# Patient Record
Sex: Female | Born: 1955 | Race: White | Hispanic: No | Marital: Single | Smoking: Current every day smoker
Health system: Southern US, Community
[De-identification: ages and names within clinical notes are randomized; demographics above are authoritative.]

## PROBLEM LIST (undated history)

## (undated) DIAGNOSIS — I10 Essential (primary) hypertension: Secondary | ICD-10-CM

## (undated) DIAGNOSIS — M810 Age-related osteoporosis without current pathological fracture: Secondary | ICD-10-CM

## (undated) DIAGNOSIS — E7800 Pure hypercholesterolemia, unspecified: Secondary | ICD-10-CM

## (undated) DIAGNOSIS — N3281 Overactive bladder: Secondary | ICD-10-CM

## (undated) DIAGNOSIS — E119 Type 2 diabetes mellitus without complications: Secondary | ICD-10-CM

## (undated) DIAGNOSIS — Z9989 Dependence on other enabling machines and devices: Secondary | ICD-10-CM

## (undated) DIAGNOSIS — F32A Depression, unspecified: Secondary | ICD-10-CM

## (undated) DIAGNOSIS — G4733 Obstructive sleep apnea (adult) (pediatric): Secondary | ICD-10-CM

## (undated) DIAGNOSIS — K449 Diaphragmatic hernia without obstruction or gangrene: Secondary | ICD-10-CM

## (undated) DIAGNOSIS — C801 Malignant (primary) neoplasm, unspecified: Secondary | ICD-10-CM

## (undated) DIAGNOSIS — J8410 Pulmonary fibrosis, unspecified: Secondary | ICD-10-CM

## (undated) DIAGNOSIS — F329 Major depressive disorder, single episode, unspecified: Secondary | ICD-10-CM

## (undated) DIAGNOSIS — C3490 Malignant neoplasm of unspecified part of unspecified bronchus or lung: Secondary | ICD-10-CM

## (undated) DIAGNOSIS — F419 Anxiety disorder, unspecified: Secondary | ICD-10-CM

## (undated) DIAGNOSIS — J449 Chronic obstructive pulmonary disease, unspecified: Secondary | ICD-10-CM

## (undated) DIAGNOSIS — R011 Cardiac murmur, unspecified: Secondary | ICD-10-CM

## (undated) HISTORY — PX: ABDOMINAL HYSTERECTOMY: SHX81

## (undated) HISTORY — PX: LUNG BIOPSY: SHX232

## (undated) HISTORY — PX: BLADDER SUSPENSION: SHX72

---

## 2012-12-20 ENCOUNTER — Inpatient Hospital Stay (HOSPITAL_COMMUNITY)
Admission: EM | Admit: 2012-12-20 | Discharge: 2012-12-24 | DRG: 189 | Disposition: A | Discharge status: Home / Self Care | Payer: Medicare Other | Attending: Internal Medicine | Admitting: Internal Medicine

## 2012-12-20 ENCOUNTER — Emergency Department (HOSPITAL_COMMUNITY): Payer: Medicare Other

## 2012-12-20 ENCOUNTER — Encounter (HOSPITAL_COMMUNITY): Payer: Self-pay | Admitting: *Deleted

## 2012-12-20 DIAGNOSIS — J8410 Pulmonary fibrosis, unspecified: Secondary | ICD-10-CM | POA: Diagnosis present

## 2012-12-20 DIAGNOSIS — Z9981 Dependence on supplemental oxygen: Secondary | ICD-10-CM

## 2012-12-20 DIAGNOSIS — Z9989 Dependence on other enabling machines and devices: Secondary | ICD-10-CM | POA: Diagnosis present

## 2012-12-20 DIAGNOSIS — IMO0002 Reserved for concepts with insufficient information to code with codable children: Secondary | ICD-10-CM

## 2012-12-20 DIAGNOSIS — D509 Iron deficiency anemia, unspecified: Secondary | ICD-10-CM | POA: Diagnosis present

## 2012-12-20 DIAGNOSIS — Z79899 Other long term (current) drug therapy: Secondary | ICD-10-CM

## 2012-12-20 DIAGNOSIS — E119 Type 2 diabetes mellitus without complications: Secondary | ICD-10-CM | POA: Diagnosis present

## 2012-12-20 DIAGNOSIS — F419 Anxiety disorder, unspecified: Secondary | ICD-10-CM | POA: Diagnosis present

## 2012-12-20 DIAGNOSIS — M810 Age-related osteoporosis without current pathological fracture: Secondary | ICD-10-CM | POA: Diagnosis present

## 2012-12-20 DIAGNOSIS — K219 Gastro-esophageal reflux disease without esophagitis: Secondary | ICD-10-CM | POA: Diagnosis present

## 2012-12-20 DIAGNOSIS — F329 Major depressive disorder, single episode, unspecified: Secondary | ICD-10-CM | POA: Diagnosis present

## 2012-12-20 DIAGNOSIS — K449 Diaphragmatic hernia without obstruction or gangrene: Secondary | ICD-10-CM | POA: Diagnosis present

## 2012-12-20 DIAGNOSIS — E785 Hyperlipidemia, unspecified: Secondary | ICD-10-CM | POA: Diagnosis present

## 2012-12-20 DIAGNOSIS — N3281 Overactive bladder: Secondary | ICD-10-CM | POA: Diagnosis present

## 2012-12-20 DIAGNOSIS — F3289 Other specified depressive episodes: Secondary | ICD-10-CM | POA: Diagnosis present

## 2012-12-20 DIAGNOSIS — F17200 Nicotine dependence, unspecified, uncomplicated: Secondary | ICD-10-CM | POA: Diagnosis present

## 2012-12-20 DIAGNOSIS — E7800 Pure hypercholesterolemia, unspecified: Secondary | ICD-10-CM | POA: Diagnosis present

## 2012-12-20 DIAGNOSIS — R0902 Hypoxemia: Secondary | ICD-10-CM | POA: Diagnosis present

## 2012-12-20 DIAGNOSIS — J441 Chronic obstructive pulmonary disease with (acute) exacerbation: Secondary | ICD-10-CM | POA: Diagnosis present

## 2012-12-20 DIAGNOSIS — Z86718 Personal history of other venous thrombosis and embolism: Secondary | ICD-10-CM

## 2012-12-20 DIAGNOSIS — J449 Chronic obstructive pulmonary disease, unspecified: Secondary | ICD-10-CM

## 2012-12-20 DIAGNOSIS — I10 Essential (primary) hypertension: Secondary | ICD-10-CM | POA: Diagnosis present

## 2012-12-20 DIAGNOSIS — N318 Other neuromuscular dysfunction of bladder: Secondary | ICD-10-CM | POA: Diagnosis present

## 2012-12-20 DIAGNOSIS — F411 Generalized anxiety disorder: Secondary | ICD-10-CM | POA: Diagnosis present

## 2012-12-20 DIAGNOSIS — J9620 Acute and chronic respiratory failure, unspecified whether with hypoxia or hypercapnia: Principal | ICD-10-CM | POA: Diagnosis present

## 2012-12-20 DIAGNOSIS — I498 Other specified cardiac arrhythmias: Secondary | ICD-10-CM | POA: Diagnosis present

## 2012-12-20 DIAGNOSIS — G4733 Obstructive sleep apnea (adult) (pediatric): Secondary | ICD-10-CM | POA: Diagnosis present

## 2012-12-20 HISTORY — DX: Age-related osteoporosis without current pathological fracture: M81.0

## 2012-12-20 HISTORY — DX: Diaphragmatic hernia without obstruction or gangrene: K44.9

## 2012-12-20 HISTORY — DX: Anxiety disorder, unspecified: F41.9

## 2012-12-20 HISTORY — DX: Obstructive sleep apnea (adult) (pediatric): G47.33

## 2012-12-20 HISTORY — DX: Depression, unspecified: F32.A

## 2012-12-20 HISTORY — DX: Pure hypercholesterolemia, unspecified: E78.00

## 2012-12-20 HISTORY — DX: Dependence on other enabling machines and devices: Z99.89

## 2012-12-20 HISTORY — DX: Overactive bladder: N32.81

## 2012-12-20 HISTORY — DX: Pulmonary fibrosis, unspecified: J84.10

## 2012-12-20 HISTORY — DX: Chronic obstructive pulmonary disease, unspecified: J44.9

## 2012-12-20 HISTORY — DX: Type 2 diabetes mellitus without complications: E11.9

## 2012-12-20 HISTORY — DX: Essential (primary) hypertension: I10

## 2012-12-20 HISTORY — DX: Major depressive disorder, single episode, unspecified: F32.9

## 2012-12-20 LAB — BASIC METABOLIC PANEL
BUN: 12 mg/dL (ref 6–23)
Calcium: 9.2 mg/dL (ref 8.4–10.5)
GFR calc Af Amer: >90 mL/min (ref >90)
GFR calc non Af Amer: >90 mL/min (ref >90)
Potassium: 3.6 mEq/L (ref 3.5–5.1)
Sodium: 141 mEq/L (ref 135–145)

## 2012-12-20 LAB — CBC WITH DIFFERENTIAL/PLATELET
Basophils Relative: 0 % (ref 0–1)
Eosinophils Absolute: 0 10*3/uL (ref 0.0–0.7)
MCH: 29.6 pg (ref 26.0–34.0)
MCHC: 31.1 g/dL (ref 30.0–36.0)
Monocytes Relative: 3 % (ref 3–12)
Neutrophils Relative %: 92 % — ABNORMAL HIGH (ref 43–77)
Platelets: 413 10*3/uL — ABNORMAL HIGH (ref 150–400)

## 2012-12-20 LAB — URINALYSIS, ROUTINE W REFLEX MICROSCOPIC
Bilirubin Urine: NEGATIVE
Glucose, UA: NEGATIVE mg/dL
Ketones, ur: NEGATIVE mg/dL
Leukocytes, UA: NEGATIVE
Protein, ur: NEGATIVE mg/dL

## 2012-12-20 LAB — CREATININE, SERUM: Creatinine, Ser: 0.55 mg/dL (ref 0.50–1.10)

## 2012-12-20 LAB — POCT I-STAT 3, ART BLOOD GAS (G3+)
Acid-Base Excess: 2 mmol/L (ref 0.0–2.0)
Bicarbonate: 26.6 mEq/L — ABNORMAL HIGH (ref 20.0–24.0)
O2 Saturation: 95 %
Patient temperature: 98.6
TCO2: 28 mmol/L (ref 0–100)

## 2012-12-20 LAB — CBC
MCH: 30 pg (ref 26.0–34.0)
MCHC: 31.4 g/dL (ref 30.0–36.0)
Platelets: 439 10*3/uL — ABNORMAL HIGH (ref 150–400)
RDW: 17.2 % — ABNORMAL HIGH (ref 11.5–15.5)

## 2012-12-20 MED ORDER — ALBUTEROL SULFATE (5 MG/ML) 0.5% IN NEBU
5.0000 mg | INHALATION_SOLUTION | Freq: Once | RESPIRATORY_TRACT | Status: AC
  Administered 2012-12-20: 5 mg via RESPIRATORY_TRACT
  Filled 2012-12-20 (×2): qty 1

## 2012-12-20 MED ORDER — ACLIDINIUM BROMIDE 400 MCG/ACT IN AEPB: 1.0000 | INHALATION_SPRAY | Freq: Two times a day (BID) | RESPIRATORY_TRACT | Status: DC

## 2012-12-20 MED ORDER — POTASSIUM CHLORIDE ER 10 MEQ PO TBCR
10.0000 meq | EXTENDED_RELEASE_TABLET | Freq: Every day | ORAL | Status: DC
  Administered 2012-12-20 – 2012-12-23 (×4): 10 meq via ORAL
  Filled 2012-12-20 (×5): qty 1

## 2012-12-20 MED ORDER — AZATHIOPRINE 50 MG PO TABS
50.0000 mg | ORAL_TABLET | Freq: Two times a day (BID) | ORAL | Status: DC
  Administered 2012-12-20 – 2012-12-24 (×8): 50 mg via ORAL
  Filled 2012-12-20 (×9): qty 1

## 2012-12-20 MED ORDER — TIOTROPIUM BROMIDE MONOHYDRATE 18 MCG IN CAPS
18.0000 ug | ORAL_CAPSULE | Freq: Every day | RESPIRATORY_TRACT | Status: DC
  Administered 2012-12-21 – 2012-12-24 (×4): 18 ug via RESPIRATORY_TRACT
  Filled 2012-12-20: qty 5

## 2012-12-20 MED ORDER — PANTOPRAZOLE SODIUM 40 MG PO TBEC
40.0000 mg | DELAYED_RELEASE_TABLET | Freq: Every day | ORAL | Status: DC
  Administered 2012-12-21 – 2012-12-24 (×4): 40 mg via ORAL
  Filled 2012-12-20 (×4): qty 1

## 2012-12-20 MED ORDER — DOXYCYCLINE HYCLATE 100 MG PO TABS
100.0000 mg | ORAL_TABLET | Freq: Two times a day (BID) | ORAL | Status: DC
  Administered 2012-12-20 – 2012-12-24 (×8): 100 mg via ORAL
  Filled 2012-12-20 (×9): qty 1

## 2012-12-20 MED ORDER — INSULIN ASPART 100 UNIT/ML ~~LOC~~ SOLN
0.0000 [IU] | Freq: Three times a day (TID) | SUBCUTANEOUS | Status: DC
  Administered 2012-12-21: 3 [IU] via SUBCUTANEOUS
  Administered 2012-12-21: 5 [IU] via SUBCUTANEOUS
  Administered 2012-12-21 – 2012-12-22 (×3): 3 [IU] via SUBCUTANEOUS
  Administered 2012-12-22: 5 [IU] via SUBCUTANEOUS
  Administered 2012-12-23 (×2): 3 [IU] via SUBCUTANEOUS
  Administered 2012-12-24: 8 [IU] via SUBCUTANEOUS
  Administered 2012-12-24: 2 [IU] via SUBCUTANEOUS

## 2012-12-20 MED ORDER — POTASSIUM CHLORIDE ER 10 MEQ PO TBCR: 10.0000 meq | EXTENDED_RELEASE_TABLET | Freq: Every day | ORAL | Status: DC

## 2012-12-20 MED ORDER — FAMOTIDINE 10 MG PO TABS
10.0000 mg | ORAL_TABLET | Freq: Every day | ORAL | Status: DC
  Administered 2012-12-21 – 2012-12-24 (×4): 10 mg via ORAL
  Filled 2012-12-20 (×4): qty 1

## 2012-12-20 MED ORDER — ALBUTEROL SULFATE (5 MG/ML) 0.5% IN NEBU
2.5000 mg | INHALATION_SOLUTION | Freq: Three times a day (TID) | RESPIRATORY_TRACT | Status: DC
  Administered 2012-12-21 (×2): 2.5 mg via RESPIRATORY_TRACT
  Filled 2012-12-20 (×2): qty 0.5

## 2012-12-20 MED ORDER — INSULIN ASPART 100 UNIT/ML ~~LOC~~ SOLN
3.0000 [IU] | Freq: Three times a day (TID) | SUBCUTANEOUS | Status: DC
  Administered 2012-12-21 – 2012-12-22 (×4): 3 [IU] via SUBCUTANEOUS
  Administered 2012-12-22: 17:00:00 via SUBCUTANEOUS
  Administered 2012-12-22 – 2012-12-24 (×6): 3 [IU] via SUBCUTANEOUS

## 2012-12-20 MED ORDER — LISINOPRIL 10 MG PO TABS
10.0000 mg | ORAL_TABLET | Freq: Every day | ORAL | Status: DC
  Administered 2012-12-21 – 2012-12-23 (×3): 10 mg via ORAL
  Filled 2012-12-20 (×3): qty 1

## 2012-12-20 MED ORDER — METHYLPREDNISOLONE SODIUM SUCC 125 MG IJ SOLR
125.0000 mg | Freq: Once | INTRAMUSCULAR | Status: AC
  Administered 2012-12-20: 125 mg via INTRAVENOUS
  Filled 2012-12-20: qty 2

## 2012-12-20 MED ORDER — ALBUTEROL SULFATE (5 MG/ML) 0.5% IN NEBU: 2.5000 mg | INHALATION_SOLUTION | Freq: Three times a day (TID) | RESPIRATORY_TRACT | Status: DC

## 2012-12-20 MED ORDER — FUROSEMIDE 10 MG/ML IJ SOLN
40.0000 mg | Freq: Once | INTRAMUSCULAR | Status: AC
  Administered 2012-12-20: 40 mg via INTRAVENOUS
  Filled 2012-12-20 (×2): qty 4

## 2012-12-20 MED ORDER — OXYBUTYNIN CHLORIDE ER 10 MG PO TB24: 10.0000 mg | ORAL_TABLET | Freq: Every day | ORAL | Status: DC

## 2012-12-20 MED ORDER — ALBUTEROL SULFATE (5 MG/ML) 0.5% IN NEBU
2.5000 mg | INHALATION_SOLUTION | RESPIRATORY_TRACT | Status: DC | PRN
  Administered 2012-12-21: 2.5 mg via RESPIRATORY_TRACT
  Filled 2012-12-20: qty 0.5

## 2012-12-20 MED ORDER — IPRATROPIUM BROMIDE 0.02 % IN SOLN
0.5000 mg | Freq: Four times a day (QID) | RESPIRATORY_TRACT | Status: DC
  Filled 2012-12-20: qty 2.5

## 2012-12-20 MED ORDER — PAROXETINE HCL 30 MG PO TABS
30.0000 mg | ORAL_TABLET | Freq: Every day | ORAL | Status: DC
  Administered 2012-12-21 – 2012-12-24 (×4): 30 mg via ORAL
  Filled 2012-12-20 (×4): qty 1

## 2012-12-20 MED ORDER — IPRATROPIUM BROMIDE 0.02 % IN SOLN
0.5000 mg | Freq: Once | RESPIRATORY_TRACT | Status: AC
  Administered 2012-12-20: 0.5 mg via RESPIRATORY_TRACT
  Filled 2012-12-20 (×2): qty 2.5

## 2012-12-20 MED ORDER — SODIUM CHLORIDE 0.9 % IJ SOLN
3.0000 mL | Freq: Two times a day (BID) | INTRAMUSCULAR | Status: DC
  Administered 2012-12-20 – 2012-12-24 (×8): 3 mL via INTRAVENOUS

## 2012-12-20 MED ORDER — HEPARIN SODIUM (PORCINE) 5000 UNIT/ML IJ SOLN
5000.0000 [IU] | Freq: Three times a day (TID) | INTRAMUSCULAR | Status: DC
  Administered 2012-12-20 – 2012-12-24 (×12): 5000 [IU] via SUBCUTANEOUS
  Filled 2012-12-20 (×14): qty 1

## 2012-12-20 MED ORDER — FOLIC ACID 1 MG PO TABS
1.0000 mg | ORAL_TABLET | Freq: Two times a day (BID) | ORAL | Status: DC
  Administered 2012-12-20 – 2012-12-24 (×8): 1 mg via ORAL
  Filled 2012-12-20 (×9): qty 1

## 2012-12-20 MED ORDER — GUAIFENESIN ER 600 MG PO TB12
600.0000 mg | ORAL_TABLET | Freq: Two times a day (BID) | ORAL | Status: DC
  Administered 2012-12-20 – 2012-12-24 (×8): 600 mg via ORAL
  Filled 2012-12-20 (×9): qty 1

## 2012-12-20 MED ORDER — METHYLPREDNISOLONE SODIUM SUCC 125 MG IJ SOLR
80.0000 mg | Freq: Four times a day (QID) | INTRAMUSCULAR | Status: DC
  Administered 2012-12-20 – 2012-12-21 (×3): 80 mg via INTRAVENOUS
  Filled 2012-12-20 (×6): qty 1.28

## 2012-12-20 MED ORDER — MOMETASONE FURO-FORMOTEROL FUM 100-5 MCG/ACT IN AERO
2.0000 | INHALATION_SPRAY | Freq: Two times a day (BID) | RESPIRATORY_TRACT | Status: DC
  Administered 2012-12-20 – 2012-12-23 (×6): 2 via RESPIRATORY_TRACT
  Filled 2012-12-20: qty 8.8

## 2012-12-20 MED ORDER — OXYBUTYNIN CHLORIDE ER 10 MG PO TB24
10.0000 mg | ORAL_TABLET | Freq: Every day | ORAL | Status: DC
  Administered 2012-12-20 – 2012-12-23 (×4): 10 mg via ORAL
  Filled 2012-12-20 (×5): qty 1

## 2012-12-20 MED ORDER — LORAZEPAM 0.5 MG PO TABS
1.0000 mg | ORAL_TABLET | Freq: Every day | ORAL | Status: DC
  Administered 2012-12-20 – 2012-12-23 (×4): 1 mg via ORAL
  Filled 2012-12-20 (×4): qty 2

## 2012-12-20 MED ORDER — NICOTINE 21 MG/24HR TD PT24
21.0000 mg | MEDICATED_PATCH | Freq: Every day | TRANSDERMAL | Status: DC
  Administered 2012-12-21 – 2012-12-24 (×5): 21 mg via TRANSDERMAL
  Filled 2012-12-20 (×5): qty 1

## 2012-12-20 NOTE — ED Notes (Addendum)
Reports hx of copd and pulmonary fibrosis. Wears o2 at home. Reports having increase in sob since Monday, has been to pcp and started on prednisone, xray was negative for pneumonia. Went back for recheck yesterday and room air sats were still <80%. Had pulmonary tests done and sent here for further eval due to low sats. Also reports recent swelling to right leg.

## 2012-12-20 NOTE — Progress Notes (Signed)
Admitted to rm [id number] from ED, oriented to room, call bell placed within reach, denies pain at this time, pt short of breath on exertion, on 2L [place] which is home dose. Dianna Limbo RN did admission history. Will monitor.

## 2012-12-20 NOTE — H&P (Addendum)
[facility] History and Physical  Jessica Rollins ZOX:[id number] DOB: [birth year] DOA: 12/20/2012    Referring physician: Blinda Leatherwood, EDP PCP: Jessica Conners., MD  Specialists: none currently  Chief Complaint: SOB  HPI: Jessica Rollins is a 57 y.o. female with known h/o ILD/COPD-has had chronic cough which worsened on 12/16/12-she went there einitally to see about gettign a sleept study.  A VCR there showed no PNA but was given an extra set of prednisone, and antibiotic and a differnet inhaler than her usual.  She went home 6/12 for follow-up and when she returned.  HEr sats at that time per her recollection were noted to be in the 7-0's.  WHen she walks she states that this has been happening when she walks,. Last night she had intractable coughing and noticed that her R ankle swelled up and she was told to present by her Pulmonologist to the ED for this She usually uses Oxygen 2l and has been using this since her diagnosis in 2001-her Lung disease was biospy confirmed and the biospy caused a collapsed lung and she needed She is still a chronic smoker but has never quit   Patient states she was only a little better with the Rx given to her but as soon as she moved wehen she tried to walk she would cough and feel SOB. denies any palpitations, CP, Blurred or double vision, n/v/diarr, dysuria, falls weakness on any one side of the body, fever chills rigors, itching rash orthopnea, PND  Review of Systems:  See above  Past Medical History  Diagnosis Date  . COPD (chronic obstructive pulmonary disease)   . Pulmonary fibrosis   . Diabetes mellitus without complication   . Hiatal hernia   . Depression   . Anxiety   . High cholesterol   . Hypertension    History reviewed. No pertinent past surgical history. Social History:  reports that she has been smoking Cigarettes.  She started smoking about 49 years ago. She has been smoking about 1.50 packs per day. She does not have any smokeless tobacco  history on file. She reports that she does not drink alcohol or use illicit drugs.  No Known Allergies  Family History  Problem Relation Age of Onset  . Pulmonary fibrosis Brother    Prior to Admission medications   Medication Sig Start Date End Date Taking? Authorizing Provider  Aclidinium Bromide 400 MCG/ACT AEPB Inhale 1 Inhaler into the lungs 2 (two) times daily.   Yes Historical Provider, MD  albuterol (PROVENTIL HFA;VENTOLIN HFA) 108 (90 BASE) MCG/ACT inhaler Inhale 2 puffs into the lungs every 6 (six) hours as needed for wheezing.   Yes Historical Provider, MD  azaTHIOprine (IMURAN) 50 MG tablet Take 50 mg by mouth 2 (two) times daily.   Yes Historical Provider, MD  calcium citrate-vitamin D (CITRACAL+D) 315-200 MG-UNIT per tablet Take 1 tablet by mouth daily.   Yes Historical Provider, MD  Cimetidine (TAGAMET PO) Take 1 tablet by mouth daily.   Yes Historical Provider, MD  doxycycline (VIBRA-TABS) 100 MG tablet Take 100 mg by mouth 2 (two) times daily. Takes for 10 days.  First dose 12/16/2012.   Yes Historical Provider, MD  esomeprazole (NEXIUM) 40 MG capsule Take 40 mg by mouth daily before breakfast.   Yes Historical Provider, MD  Fluticasone-Salmeterol (ADVAIR) 250-50 MCG/DOSE AEPB Inhale 1 puff into the lungs every 12 (twelve) hours.   Yes Historical Provider, MD  folic acid (FOLVITE) 1 MG tablet Take 1 mg by mouth 2 (  two) times daily.   Yes Historical Provider, MD  guaiFENesin (MUCINEX) 600 MG 12 hr tablet Take 600 mg by mouth 2 (two) times daily.   Yes Historical Provider, MD  IRON PO Take 1 tablet by mouth 4 (four) times daily.   Yes Historical Provider, MD  lisinopril (PRINIVIL,ZESTRIL) 10 MG tablet Take 10 mg by mouth daily.   Yes Historical Provider, MD  LORazepam (ATIVAN) 1 MG tablet Take 1 mg by mouth at bedtime.   Yes Historical Provider, MD  METFORMIN HCL ER PO Take 1 tablet by mouth every evening.   Yes Historical Provider, MD  niacin 500 MG tablet Take 500 mg by mouth  daily.   Yes Historical Provider, MD  Omega-3 Fatty Acids (FISH OIL) 300 MG CAPS Take 1 capsule by mouth daily.   Yes Historical Provider, MD  oxybutynin (DITROPAN-XL) 10 MG 24 hr tablet Take 10 mg by mouth daily.   Yes Historical Provider, MD  PARoxetine (PAXIL) 30 MG tablet Take 30 mg by mouth daily.   Yes Historical Provider, MD  potassium chloride (K-DUR) 10 MEQ tablet Take 10 mEq by mouth daily.   Yes Historical Provider, MD  predniSONE (DELTASONE) 10 MG tablet Take 20 mg by mouth 2 (two) times daily.   Yes Historical Provider, MD  rosuvastatin (CRESTOR) 10 MG tablet Take 10 mg by mouth daily.   Yes Historical Provider, MD   Physical Exam: Filed Vitals:   12/20/12 1445 12/20/12 1500 12/20/12 1515 12/20/12 1530  BP: 145/66 143/68 156/69 153/65  Pulse: 61 56 53 53  Temp:      TempSrc:      Resp: 16 16 14 19   SpO2: 96% 97% 97% 94%     General:  EOMI, NCAt, looks oleder than stated age  Eyes: no ict/pallor  ENT: mod dentition  Neck: JVD elevated about 6 cm  Cardiovascular: s1 s 2no m/r/g  Respiratory: clear, no added no TVR/TVF  Abdomen: soft, NT/ND  Skin: nad  Musculoskeletal: rom intact  Psychiatric: euthymic  Neurologic: Grossly intact with no abnormal exam  Labs on Admission:  Basic Metabolic Panel:  Recent Labs Lab 12/20/12 1249  NA 141  K 3.6  CL 105  CO2 25  GLUCOSE 140*  BUN 12  CREATININE 0.46*  CALCIUM 9.2   Liver Function Tests: No results found for this basename: AST, ALT, ALKPHOS, BILITOT, PROT, ALBUMIN,  in the last 168 hours No results found for this basename: LIPASE, AMYLASE,  in the last 168 hours No results found for this basename: AMMONIA,  in the last 168 hours CBC:  Recent Labs Lab 12/20/12 1249  WBC 13.8*  NEUTROABS 12.7*  HGB 10.6*  HCT 34.1*  MCV 95.3  PLT 413*   Cardiac Enzymes:  Recent Labs Lab 12/20/12 1249  TROPONINI <0.30    BNP (last 3 results)  Recent Labs  12/20/12 1249  PROBNP 840.2*   CBG: No  results found for this basename: GLUCAP,  in the last 168 hours  Radiological Exams on Admission: Dg Chest 2 View  12/20/2012   *RADIOLOGY REPORT*  Clinical Data: Shortness of breath, history pulmonary fibrosis, COPD, diabetes, hypertension  CHEST - 2 VIEW  Comparison: None  Findings: Upper normal heart size. Atherosclerotic calcification aorta. Pulmonary vascular congestion. Diffuse interstitial changes throughout both lungs. Bibasilar atelectasis versus scarring. No gross pleural effusion or additional segmental consolidation. No pneumothorax. Bones appear demineralized.  IMPRESSION: Upper normal heart size with pulmonary vascular congestion. Bibasilar atelectasis versus scarring greater on  left. Diffuse interstitial prominence, greatest at bases, could represent chronic interstitial lung disease/fibrosis though diffuse pulmonary edema or infection could also cause this appearance. No prior exams available to know baseline appearance of the lungs.   Original Report Authenticated By: Ulyses Southward, M.D.    EKG: Independently reviewed. Sinus rhythm, borderline bradycardic, PR 0.04, QRS axis80 degreees, n acute St-t wave changes or inversions  Assessment/Plan Principal Problem:   COPD (chronic obstructive pulmonary disease) Active Problems:   Pulmonary fibrosis   Diabetes mellitus without complication   OSA on CPAP   Hypertension   Anxiety   Overactive bladder   1. Type I acute respiratory failure-likely secondary to #2 = acute exacerbation of COPD + #3-ABG shows a PaO2 of 70% on 2 L of oxygen she is compensated otherwise with a normal CO2 so this is more likely oxygen deficiency 2. AECOPD-patient probably had a bronchitis which exacerbated her already tenuous respiratory state with a diagnosis of interstitial lung disease. She will need to continue her as if). We'll place on Solu-Medrol 80 mg 3 times a day and give every 2 hourly albuterol nebulizations as well as tiotropium every 6 hourly 2.5 mg.  If she does not improve, we will consider a pulmonary consultation. Continue doxycycline-start date was 12/16/2012 3. ILD-continue Imuran, steroids-consider trial with Pirfenidone as per Pulmonary as an out-patient as [facility] a center for this trial 4. ? CHF-no history of ischemic heart disease, however she has a BNP of 800 which makes her diagnosis CHF potential, we'll get an echocardiogram to rule out wall motion abnormalities although EKG does not show any ventricular hypertrophy and looks in fact normal-I have given one dose of IV lasix and dependant on response to breathing, and Echo results might need to continue this later in hospital stay 5. Diabetes mellitus-expect her diabetic control to worsen, hold oral metformin and place on supplemental scale insulin moderate sensitivity coverage-we'll add long-acting insulin depending on trends. 6. Obstructive sleep apnea on CPAP-we'll start CPAP at 13 cm H2O each bedtime 7. Hypertension-patient is on lisinopril 10 mg daily. Given she has such Lopressor pulmonary history, one must consider discontinuing this given this can cause ACE-induced cough potentially place her on ARB instead 8. Anxiety/depression continue lorazepam 1 mg each bedtime, Paxil 30 mg daily 9. Reflux continue Protonix 40 mg daily, famotidine 10 mg daily [replacement for cimetidine and omeprazole] 10. Hyperlipidemia hold niacin, Crestor 10 mg, omega-3 fatty acids for now 11. History of DVT-was treated for this about 30 years ago 6 months of unfractionated heparin. Low probability of this being the cause for shortness of breath. 12. Tobacco abuse patient still smokes one and half packets a day despite her multiple lung disorders. This is probably the rate limiting step to her getting better. Counseled her to discontinue the same 13. Osteoporosis continue Citracal D. as an outpatient 14. Iron deficiency anemia-continue folic acid and iron as an outpatient  None   Code Status: Full   Family Communication: Discussed with son at bedside  Disposition Plan: inpatient, at least 2 days  Time spent: 22  Mahala Menghini [facility] Pager [phone]  If 7PM-7AM, please contact night-coverage [email or web address] Password [facility] 12/20/2012, 4:34 PM

## 2012-12-20 NOTE — ED Provider Notes (Signed)
History     CSN: [id number]  Arrival date & time 12/20/12  1218   First MD Initiated Contact with Patient 12/20/12 1234      Chief Complaint  Patient presents with  . Shortness of Breath    (Consider location/radiation/quality/duration/timing/severity/associated sxs/prior treatment) HPI Comments: Patient comes here to cover for evaluation of progressively worsening difficulty breathing. Patient has been having trouble for several weeks. She has a history of COPD and pulmonary fibrosis. She saw her pulmonary doctor on Monday. She was started on prednisone taper, doxycycline and switched from albuterol inhaler to nebulizer. Patient reports that her oxygen saturations dropped down into the 70s when she ambulated there that day. She was seen again yesterday and had similar hypoxia. Overnight, however, she says her right leg started to swell. Her breathing has worsened. She presents to the ER today because her doctor's office is closed.  Patient is a 57 y.o. female presenting with shortness of breath.  Shortness of Breath Associated symptoms: no chest pain     Past Medical History  Diagnosis Date  . COPD (chronic obstructive pulmonary disease)   . Pulmonary fibrosis   . Diabetes mellitus without complication   . Hiatal hernia   . Depression   . Anxiety   . High cholesterol   . Hypertension     History reviewed. No pertinent past surgical history.  History reviewed. No pertinent family history.  History  Substance Use Topics  . Smoking status: Current Every Day Smoker    Types: Cigarettes  . Smokeless tobacco: Not on file  . Alcohol Use: No    OB History   Grav Para Term Preterm Abortions TAB SAB Ect Mult Living                  Review of Systems  Constitutional: Positive for chills.  Respiratory: Positive for shortness of breath.   Cardiovascular: Positive for leg swelling. Negative for chest pain.  All other systems reviewed and are negative.    Allergies   Review of patient's allergies indicates no known allergies.  Home Medications  No current outpatient prescriptions on file.  BP 134/69  Pulse 64  Temp(Src) 98.4 F (36.9 C) (Oral)  Resp 24  SpO2 96%  Physical Exam  Constitutional: She is oriented to person, place, and time. She appears well-developed and well-nourished. No distress.  HENT:  Head: Normocephalic and atraumatic.  Right Ear: Hearing normal.  Left Ear: Hearing normal.  Nose: Nose normal.  Mouth/Throat: Oropharynx is clear and moist and mucous membranes are normal.  Eyes: Conjunctivae and EOM are normal. Pupils are equal, round, and reactive to light.  Neck: Normal range of motion. Neck supple.  Cardiovascular: Regular rhythm, S1 normal and S2 normal.  Exam reveals no gallop and no friction rub.   No murmur heard. Pulmonary/Chest: Accessory muscle usage present. No respiratory distress. She has decreased breath sounds. She has wheezes. She has rhonchi. She exhibits no tenderness.  Abdominal: Soft. Normal appearance and bowel sounds are normal. There is no hepatosplenomegaly. There is no tenderness. There is no rebound, no guarding, no tenderness at McBurney's point and negative Murphy's sign. No hernia.  Musculoskeletal: Normal range of motion. She exhibits edema.  Right pedal edema  Neurological: She is alert and oriented to person, place, and time. She has normal strength. No cranial nerve deficit or sensory deficit. Coordination normal. GCS eye subscore is 4. GCS verbal subscore is 5. GCS motor subscore is 6.  Skin: Skin is warm, dry  and intact. No rash noted. No cyanosis.  Psychiatric: She has a normal mood and affect. Her speech is normal and behavior is normal. Thought content normal.    ED Course  Procedures (including critical care time)  EKG:  Date: 12/20/2012  Rate: 56  Rhythm: normal sinus rhythm  QRS Axis: normal  Intervals: normal  ST/T Wave abnormalities: normal  Conduction Disutrbances:none   Narrative Interpretation:   Old EKG Reviewed: none available    Labs Reviewed - No data to display Dg Chest 2 View  12/20/2012   *RADIOLOGY REPORT*  Clinical Data: Shortness of breath, history pulmonary fibrosis, COPD, diabetes, hypertension  CHEST - 2 VIEW  Comparison: None  Findings: Upper normal heart size. Atherosclerotic calcification aorta. Pulmonary vascular congestion. Diffuse interstitial changes throughout both lungs. Bibasilar atelectasis versus scarring. No gross pleural effusion or additional segmental consolidation. No pneumothorax. Bones appear demineralized.  IMPRESSION: Upper normal heart size with pulmonary vascular congestion. Bibasilar atelectasis versus scarring greater on left. Diffuse interstitial prominence, greatest at bases, could represent chronic interstitial lung disease/fibrosis though diffuse pulmonary edema or infection could also cause this appearance. No prior exams available to know baseline appearance of the lungs.   Original Report Authenticated By: Ulyses Southward, M.D.     Diagnosis: 1. COPD exacerbation 2. Pulmonary fibrosis    MDM  Patient presents to the ER for evaluation of difficulty breathing. Patient has been experiencing progressively worsening breathing difficulty for several weeks. She has a history of COPD as well as pulmonary fibrosis. She is on aggressive outpatient therapy currently with doxycycline, prednisone, bronchodilators. Symptoms are worsening rather than improving. Oxygenation is adequate, the patient reports that she has had significant desaturation when she was evaluated by her pulmonologist twice this past week. Patient will require hospitalization for worsening symptoms despite maximal outpatient therapy.        Gilda Crease, MD 12/20/12 [phone]

## 2012-12-21 DIAGNOSIS — E119 Type 2 diabetes mellitus without complications: Secondary | ICD-10-CM

## 2012-12-21 DIAGNOSIS — J441 Chronic obstructive pulmonary disease with (acute) exacerbation: Secondary | ICD-10-CM | POA: Diagnosis present

## 2012-12-21 DIAGNOSIS — J9620 Acute and chronic respiratory failure, unspecified whether with hypoxia or hypercapnia: Secondary | ICD-10-CM | POA: Diagnosis present

## 2012-12-21 DIAGNOSIS — I10 Essential (primary) hypertension: Secondary | ICD-10-CM

## 2012-12-21 LAB — COMPREHENSIVE METABOLIC PANEL
ALT: 12 U/L (ref 0–35)
AST: 9 U/L (ref 0–37)
Albumin: 3.2 g/dL — ABNORMAL LOW (ref 3.5–5.2)
Alkaline Phosphatase: 85 U/L (ref 39–117)
BUN: 19 mg/dL (ref 6–23)
Chloride: 98 mEq/L (ref 96–112)
Potassium: 4.1 mEq/L (ref 3.5–5.1)
Sodium: 137 mEq/L (ref 135–145)
Total Bilirubin: 0.3 mg/dL (ref 0.3–1.2)

## 2012-12-21 LAB — CBC
HCT: 36.9 % (ref 36.0–46.0)
MCH: 29.6 pg (ref 26.0–34.0)
MCHC: 31.2 g/dL (ref 30.0–36.0)
MCV: 94.9 fL (ref 78.0–100.0)
Platelets: 407 10*3/uL — ABNORMAL HIGH (ref 150–400)
RDW: 17.2 % — ABNORMAL HIGH (ref 11.5–15.5)

## 2012-12-21 LAB — GLUCOSE, CAPILLARY

## 2012-12-21 MED ORDER — ALBUTEROL SULFATE (5 MG/ML) 0.5% IN NEBU
2.5000 mg | INHALATION_SOLUTION | Freq: Four times a day (QID) | RESPIRATORY_TRACT | Status: DC
  Administered 2012-12-21 – 2012-12-24 (×8): 2.5 mg via RESPIRATORY_TRACT
  Filled 2012-12-21 (×9): qty 0.5

## 2012-12-21 MED ORDER — FUROSEMIDE 10 MG/ML IJ SOLN
40.0000 mg | Freq: Once | INTRAMUSCULAR | Status: AC
  Administered 2012-12-21: 40 mg via INTRAVENOUS
  Filled 2012-12-21: qty 4

## 2012-12-21 MED ORDER — METHYLPREDNISOLONE SODIUM SUCC 125 MG IJ SOLR
60.0000 mg | Freq: Four times a day (QID) | INTRAMUSCULAR | Status: DC
  Administered 2012-12-21 – 2012-12-23 (×7): 60 mg via INTRAVENOUS
  Filled 2012-12-21 (×11): qty 0.96

## 2012-12-21 NOTE — Progress Notes (Signed)
Pt states she is feeling better today, pt given information on smoking cessation, pt verbalized she wants to quit but has had a tough time with quitting

## 2012-12-21 NOTE — Progress Notes (Addendum)
[facility] PROGRESS NOTE  Jessica Rollins FAO:[id number] DOB: [birth year] DOA: 12/20/2012 PCP: Maryann Conners., MD  Brief narrative [phone] female patient with history of chronic oxygen-dependent (2 L per minute) respiratory failure, COPD, OSA on CPAP, ILD (biopsy confirmed), smoker gives 2-3 week history of dry cough, worsening dyspnea and wheezing especially with minimal activity with associated oxygen desaturation in the 70s. She was seen by her pulmonologist and treated with steroids and antibiotics without significant relief. She noticed some swelling right ankle and? Pain yesterday. Due to persistent/worsening symptoms, patient presented to the hospital. She denies recent long-distance travel.  Assessment/Plan: 1. Acute on chronic respiratory failure: Chronic respiratory failure secondary to COPD, ILD and OSA. Acute decompensation may be secondary to COPD exacerbation. No features suggestive of overt heart failure (however chest x-ray suggests possible pulmonary edema). Other possibility is progressive ILD. Tobacco cessation counseled. Patient states that she feels fine at rest but with minimal activity becomes symptomatic. Continue oxygen, IV Solu-Medrol, bronchodilator nebulizations and doxycycline. Continue Imuran. Given complexity of case and no significant improvement with conventional OP treatment-requested pulmonology consultation for evaluation and management. Trial of her dose of IV Lasix. 2. Type II DM: Hold metformin. Continue SSI and Lantus. 3. HTN: For now continue lisinopril but may consider changing to ARB. 4. Tobacco abuse: Patient continues to smoke 1.5 packs of cigarettes per day. Tobacco cessation counseled. 5. Anxiety and depression: Continue home medications. 6. GERD: Continue PPI and Pepcid. 7. HL: Continue home medications. 8. Remote history of DVT: 9. Anemia: Stable   Code Status: Full Family Communication: Discussed with son Disposition Plan: Home when  medically stable   Consultants:  Pulmonology  Procedures:  None  Antibiotics:  Doxycycline   HPI/Subjective: No significant dyspnea, cough or wheezing at rest but has the symptoms with minimal activity. Right ankle swelling has resolved.  Objective: Filed Vitals:   12/21/12 0200 12/21/12 0613 12/21/12 0631 12/21/12 1414  BP: 158/64 167/70  139/61  Pulse: 64 61  61  Temp: 97.4 F (36.3 C) 97.4 F (36.3 C)    TempSrc: Oral Oral    Resp: 18 18  14   Height:      Weight:  66.134 kg (145 lb 12.8 oz)    SpO2: 91% 93% 94% 97%    Intake/Output Summary (Last 24 hours) at 12/21/12 1500 Last data filed at 12/21/12 0826  Gross per 24 hour  Intake    590 ml  Output    600 ml  Net    -10 ml   Filed Weights   12/20/12 1855 12/21/12 0613  Weight: 66.5 kg (146 lb 9.7 oz) 66.134 kg (145 lb 12.8 oz)    Exam:   General exam: Comfortable.  Respiratory system: Reduced breath sounds bilaterally with occasional bilateral expiratory rhonchi. Bibasal coarse Velcro-like crackles right >  Left.No increased work of breathing. Able to speak in full sentences  Cardiovascular system: S1 & S2 heard, RRR. No JVD, murmurs, gallops, clicks or pedal edema.  Gastrointestinal system: Abdomen is nondistended, soft and nontender. Normal bowel sounds heard.  Central nervous system: Alert and oriented. No focal neurological deficits.  Extremities: Symmetric 5 x 5 power. No acute findings in legs or right ankle.   Data Reviewed: Basic Metabolic Panel:  Recent Labs Lab 12/20/12 1249 12/20/12 2056 12/21/12 0530  NA 141  --  137  K 3.6  --  4.1  CL 105  --  98  CO2 25  --  26  GLUCOSE 140*  --  185*  BUN 12  --  19  CREATININE 0.46* 0.55 0.50  CALCIUM 9.2  --  9.0   Liver Function Tests:  Recent Labs Lab 12/21/12 0530  AST 9  ALT 12  ALKPHOS 85  BILITOT 0.3  PROT 6.6  ALBUMIN 3.2*   No results found for this basename: LIPASE, AMYLASE,  in the last 168 hours No results found  for this basename: AMMONIA,  in the last 168 hours CBC:  Recent Labs Lab 12/20/12 1249 12/20/12 2056 12/21/12 0530  WBC 13.8* 9.1 7.7  NEUTROABS 12.7*  --   --   HGB 10.6* 11.7* 11.5*  HCT 34.1* 37.3 36.9  MCV 95.3 95.6 94.9  PLT 413* 439* 407*   Cardiac Enzymes:  Recent Labs Lab 12/20/12 1249  TROPONINI <0.30   BNP (last 3 results)  Recent Labs  12/20/12 1249  PROBNP 840.2*   CBG:  Recent Labs Lab 12/20/12 1944 12/20/12 2146 12/21/12 0555 12/21/12 1105  GLUCAP 156* 213* 169* 244*    No results found for this or any previous visit (from the past 240 hour(s)).   Studies: Dg Chest 2 View  12/20/2012   *RADIOLOGY REPORT*  Clinical Data: Shortness of breath, history pulmonary fibrosis, COPD, diabetes, hypertension  CHEST - 2 VIEW  Comparison: None  Findings: Upper normal heart size. Atherosclerotic calcification aorta. Pulmonary vascular congestion. Diffuse interstitial changes throughout both lungs. Bibasilar atelectasis versus scarring. No gross pleural effusion or additional segmental consolidation. No pneumothorax. Bones appear demineralized.  IMPRESSION: Upper normal heart size with pulmonary vascular congestion. Bibasilar atelectasis versus scarring greater on left. Diffuse interstitial prominence, greatest at bases, could represent chronic interstitial lung disease/fibrosis though diffuse pulmonary edema or infection could also cause this appearance. No prior exams available to know baseline appearance of the lungs.   Original Report Authenticated By: Ulyses Southward, M.D.     Additional labs:   Scheduled Meds: . albuterol  2.5 mg Nebulization TID  . azaTHIOprine  50 mg Oral BID  . doxycycline  100 mg Oral BID  . famotidine  10 mg Oral Daily  . folic acid  1 mg Oral BID  . guaiFENesin  600 mg Oral BID  . heparin  5,000 Units Subcutaneous Q8H  . insulin aspart  0-15 Units Subcutaneous TID WC  . insulin aspart  3 Units Subcutaneous TID WC  . lisinopril  10 mg  Oral Daily  . LORazepam  1 mg Oral QHS  . methylPREDNISolone (SOLU-MEDROL) injection  80 mg Intravenous Q6H  . mometasone-formoterol  2 puff Inhalation BID  . nicotine  21 mg Transdermal Daily  . oxybutynin  10 mg Oral QHS  . pantoprazole  40 mg Oral Daily  . PARoxetine  30 mg Oral Daily  . potassium chloride  10 mEq Oral QHS  . sodium chloride  3 mL Intravenous Q12H  . tiotropium  18 mcg Inhalation Daily   Continuous Infusions:   Principal Problem:   COPD (chronic obstructive pulmonary disease) Active Problems:   Pulmonary fibrosis   Diabetes mellitus without complication   OSA on CPAP   Hypertension   Anxiety   Overactive bladder    Time spent: 40 minutes.    [facility]  [facility] Pager [phone].   If 8PM-8AM, please contact night-coverage at [email or web address], password [facility] 12/21/2012, 3:00 PM  LOS: 1 day

## 2012-12-21 NOTE — Progress Notes (Signed)
Nutrition Brief Note  Patient identified on the Malnutrition Screening Tool (MST) Report  Body mass index is 25.83 kg/(m^2). Patient meets criteria for overweight based on current BMI.   Current diet order is CHO modified, patient is consuming approximately 100% of meals at this time. Labs and medications reviewed. Met with pt who reports good appetite PTA, eats 2 meals/day. Pt reports over the past year or so she has lost 30 pounds but it has been gradual. Pt eating excellent during admission without any nutritional concerns or needs.   No nutrition interventions warranted at this time. If nutrition issues arise, please consult RD.    Levon Hedger MS, RD, LDN [phone] Weekend/After Hours Pager

## 2012-12-22 DIAGNOSIS — I059 Rheumatic mitral valve disease, unspecified: Secondary | ICD-10-CM

## 2012-12-22 DIAGNOSIS — G4733 Obstructive sleep apnea (adult) (pediatric): Secondary | ICD-10-CM

## 2012-12-22 DIAGNOSIS — J441 Chronic obstructive pulmonary disease with (acute) exacerbation: Secondary | ICD-10-CM

## 2012-12-22 DIAGNOSIS — J8410 Pulmonary fibrosis, unspecified: Secondary | ICD-10-CM

## 2012-12-22 DIAGNOSIS — J9620 Acute and chronic respiratory failure, unspecified whether with hypoxia or hypercapnia: Principal | ICD-10-CM

## 2012-12-22 LAB — GLUCOSE, CAPILLARY
Glucose-Capillary: 225 mg/dL — ABNORMAL HIGH (ref 70–99)
Glucose-Capillary: 179 mg/dL — ABNORMAL HIGH (ref 70–99)

## 2012-12-22 LAB — BASIC METABOLIC PANEL
BUN: 31 mg/dL — ABNORMAL HIGH (ref 6–23)
Chloride: 97 mEq/L (ref 96–112)
Glucose, Bld: 238 mg/dL — ABNORMAL HIGH (ref 70–99)
Potassium: 3.9 mEq/L (ref 3.5–5.1)
Sodium: 137 mEq/L (ref 135–145)

## 2012-12-22 LAB — HEMOGLOBIN A1C
Hgb A1c MFr Bld: 5.9 % — ABNORMAL HIGH (ref <5.7)
Mean Plasma Glucose: 123 mg/dL — ABNORMAL HIGH (ref <117)

## 2012-12-22 MED ORDER — ACETAMINOPHEN 325 MG PO TABS
650.0000 mg | ORAL_TABLET | Freq: Four times a day (QID) | ORAL | Status: DC | PRN
  Administered 2012-12-22: 650 mg via ORAL
  Filled 2012-12-22: qty 2

## 2012-12-22 NOTE — Progress Notes (Signed)
Lung sounds- good air exchange with sparatic inspiratory wheezing.  Respirations even and mildly labored with exertion.  Oxygen extention line to allow client up in room without removing.  Nicotine patch to left arm.

## 2012-12-22 NOTE — Progress Notes (Signed)
[facility] PROGRESS NOTE  Jessica Rollins WGN:[id number] DOB: [birth year] DOA: 12/20/2012 PCP: Maryann Conners., MD  Brief narrative [phone] female patient with history of chronic oxygen-dependent (2 L per minute) respiratory failure, COPD, OSA on CPAP, ILD (biopsy confirmed), smoker gives 2-3 week history of dry cough, worsening dyspnea and wheezing especially with minimal activity with associated oxygen desaturation in the 70s. She was seen by her pulmonologist and treated with steroids and antibiotics without significant relief. She noticed some swelling right ankle and? Pain yesterday. Due to persistent/worsening symptoms, patient presented to the hospital. She denies recent long-distance travel.  Assessment/Plan: 1. Acute on chronic respiratory failure: Chronic respiratory failure secondary to COPD, ILD and OSA. Acute decompensation may be secondary to COPD exacerbation. No features suggestive of overt heart failure (however chest x-ray suggests possible pulmonary edema). Other possibility is progressive ILD. Tobacco cessation counseled. Patient states that she feels fine at rest but with minimal activity becomes symptomatic. Continue oxygen, IV Solu-Medrol, bronchodilator nebulizations and doxycycline. Continue Imuran. Given complexity of case and no significant improvement with conventional OP treatment-requested pulmonology consultation for evaluation and management. Trial of her dose of IV Lasix. Patient states she feels better-less dyspneic and no coughing with activity 2. Type II DM: Hold metformin. Continue SSI and Lantus. 3. HTN: For now continue lisinopril but may consider changing to ARB. 4. Tobacco abuse: Patient continues to smoke 1.5 packs of cigarettes per day. Tobacco cessation counseled-reiterated today. 5. Anxiety and depression: Continue home medications. 6. GERD: Continue PPI and Pepcid. 7. HL: Continue home medications. 8. Remote history of DVT: 9. Anemia:  Stable   Code Status: Full Family Communication: None Disposition Plan: Home when medically stable   Consultants:  Pulmonology  Procedures:  None  Antibiotics:  Doxycycline   HPI/Subjective: Overall feels much better-decreased dyspnea no coughing with activity.  Objective: Filed Vitals:   12/21/12 2058 12/22/12 0440 12/22/12 0906 12/22/12 1012  BP: 143/60 134/69  136/60  Pulse: 62 59    Temp: 98.7 F (37.1 C) 98.2 F (36.8 C)    TempSrc: Oral Oral    Resp: 16 18    Height:      Weight:  64.5 kg (142 lb 3.2 oz)    SpO2: 98% 95% 92%     Intake/Output Summary (Last 24 hours) at 12/22/12 1431 Last data filed at 12/22/12 0930  Gross per 24 hour  Intake    680 ml  Output   3175 ml  Net  -2495 ml   Filed Weights   12/20/12 1855 12/21/12 0613 12/22/12 0440  Weight: 66.5 kg (146 lb 9.7 oz) 66.134 kg (145 lb 12.8 oz) 64.5 kg (142 lb 3.2 oz)    Exam:   General exam: Comfortable.  Respiratory system: Improved breath sounds bilaterally with occasional expiratory rhonchi. Bibasal coarse Velcro-like crackles right >  Left-seem less than yesterday.No increased work of breathing. Able to speak in full sentences  Cardiovascular system: S1 & S2 heard, RRR. No JVD, murmurs, gallops, clicks or pedal edema. Telemetry: Sinus bradycardia in the 50s-sinus rhythm.?? Short run of PAT  Gastrointestinal system: Abdomen is nondistended, soft and nontender. Normal bowel sounds heard.  Central nervous system: Alert and oriented. No focal neurological deficits.  Extremities: Symmetric 5 x 5 power. No acute findings in legs or right ankle.   Data Reviewed: Basic Metabolic Panel:  Recent Labs Lab 12/20/12 1249 12/20/12 2056 12/21/12 0530 12/22/12 0350  NA 141  --  137 137  K 3.6  --  4.1  3.9  CL 105  --  98 97  CO2 25  --  26 28  GLUCOSE 140*  --  185* 238*  BUN 12  --  19 31*  CREATININE 0.46* 0.55 0.50 0.54  CALCIUM 9.2  --  9.0 9.5   Liver Function Tests:  Recent  Labs Lab 12/21/12 0530  AST 9  ALT 12  ALKPHOS 85  BILITOT 0.3  PROT 6.6  ALBUMIN 3.2*   No results found for this basename: LIPASE, AMYLASE,  in the last 168 hours No results found for this basename: AMMONIA,  in the last 168 hours CBC:  Recent Labs Lab 12/20/12 1249 12/20/12 2056 12/21/12 0530  WBC 13.8* 9.1 7.7  NEUTROABS 12.7*  --   --   HGB 10.6* 11.7* 11.5*  HCT 34.1* 37.3 36.9  MCV 95.3 95.6 94.9  PLT 413* 439* 407*   Cardiac Enzymes:  Recent Labs Lab 12/20/12 1249  TROPONINI <0.30   BNP (last 3 results)  Recent Labs  12/20/12 1249  PROBNP 840.2*   CBG:  Recent Labs Lab 12/21/12 1105 12/21/12 1617 12/21/12 2108 12/22/12 0553 12/22/12 1106  GLUCAP 244* 151* 169* 225* 156*    No results found for this or any previous visit (from the past 240 hour(s)).   Studies: No results found.   Additional labs:   Scheduled Meds: . albuterol  2.5 mg Nebulization Q6H  . azaTHIOprine  50 mg Oral BID  . doxycycline  100 mg Oral BID  . famotidine  10 mg Oral Daily  . folic acid  1 mg Oral BID  . guaiFENesin  600 mg Oral BID  . heparin  5,000 Units Subcutaneous Q8H  . insulin aspart  0-15 Units Subcutaneous TID WC  . insulin aspart  3 Units Subcutaneous TID WC  . lisinopril  10 mg Oral Daily  . LORazepam  1 mg Oral QHS  . methylPREDNISolone (SOLU-MEDROL) injection  60 mg Intravenous Q6H  . mometasone-formoterol  2 puff Inhalation BID  . nicotine  21 mg Transdermal Daily  . oxybutynin  10 mg Oral QHS  . pantoprazole  40 mg Oral Daily  . PARoxetine  30 mg Oral Daily  . potassium chloride  10 mEq Oral QHS  . sodium chloride  3 mL Intravenous Q12H  . tiotropium  18 mcg Inhalation Daily   Continuous Infusions:   Principal Problem:   Acute-on-chronic respiratory failure Active Problems:   Pulmonary fibrosis   COPD (chronic obstructive pulmonary disease)   Diabetes mellitus without complication   OSA on CPAP   Hypertension   Anxiety    Overactive bladder   COPD exacerbation    Time spent: 20 minutes.    [facility]  [facility] Pager [phone].   If 8PM-8AM, please contact night-coverage at [email or web address], password [facility] 12/22/2012, 2:31 PM  LOS: 2 days

## 2012-12-22 NOTE — Consult Note (Signed)
PULMONARY/ CCM CONSULT  Patient name: Jessica Rollins Medical record number: [id number] Date of birth: Nov 20, 1955 Age: 57 y.o. Gender: female PCP: Henerson, Rutherford Guys., MD  Reason for Consult:  Dyspnea, Cough Referring Physician:  [facility]   Lines/Tubes:   Microbiology/Sepsis markers:   Anti-infectives:    Best Practice/Protocols:    Key Events:    History of Present Illness: 57 y/o WF from [place], [place].  Expertly cared for (for many yrs) by her local Pulmonologist, DrHenderson.  We do not have any records and she has never been seen in [place] previously.  She says she came here because she did not want to go to the local hosp in [place].  She reports a long hx of COPD on chronic home oxygen at 2L/min, still smokes 1.5ppd, and has superimposed diffuse pulm fibrosis diagnosed >33yrs ago w/ lung biopsy & treated for many yrs on Imuran & pulse Prednisone courses for resp exacerbations...  She was in her usual state of health until about 1 week ago- noted increased cough, small amt beige sputum w/o hemoptysis, increased SOB & some chest discomfort from the coughing.  She saw DrHenderson's team & had CXR (no change), and was placed on Prednisone, Doxycycline, & a Nebulizer.  Her O2 sats were 79 by her report on RA w/ ambulation in the office.  She had also recently had a Sleep Study which she reports was pos for OSA.  They had arranged for additional studies including a walk test, but she decided to come to [facility] due to her acute dyspnea, which seems to be mostly an acute exacerbation of her COPD due to her continued smoking... MEDS>  Apparently prev on Advair & Spiriva- recently switched to Tudorza & NEBS; Pred10mg /d; Doxy100mg Bid; Imuran50mg Bid, Mucinex600mg Bid  Additional medical problems: HBP> on Lisinopril10 Hypercholesterolemia> on Crestor10, Niacin500, FidhOil Diabetes> on Metformin500 HH, GERD> on Nexium40 &  Tagamet Anxiety, Depression> on?Paxil & Lorazepam Anemia> on vits    Past Medical History  Diagnosis Date  . COPD (chronic obstructive pulmonary disease)   . Pulmonary fibrosis     diagnosed with this 2001.    . Diabetes mellitus without complication   . Hiatal hernia   . Depression   . Anxiety   . High cholesterol   . Hypertension   . Osteoporosis   . OSA on CPAP     13cm  . Overactive bladder     Past Surgical History  Procedure Laterality Date  . Bladder suspension    . Abdominal hysterectomy      severe bleeding-when she was in her 57's    Family History  Problem Relation Age of Onset  . Pulmonary fibrosis Brother     History   Social History  . Marital Status: Single    Spouse Name: N/A    Number of Children: N/A  . Years of Education: N/A   Occupational History  . Not on file.   Social History Main Topics  . Smoking status: Current Every Day Smoker -- 1.50 packs/day    Types: Cigarettes    Start date: 07/11/1963  . Smokeless tobacco: Not on file  . Alcohol Use: No  . Drug Use: No  . Sexually Active: Not on file   Other Topics Concern  . Not on file   Social History Narrative   Lives in [place]   Disabled from breathing issues- and collapsed lung     Prior to Admission medications  Medication Sig Start Date End Date Taking? Authorizing  Provider  Aclidinium Bromide 400 MCG/ACT AEPB Inhale 1 Inhaler into the lungs 2 (two) times daily.   Yes Historical Provider, MD  albuterol (PROVENTIL HFA;VENTOLIN HFA) 108 (90 BASE) MCG/ACT inhaler Inhale 2 puffs into the lungs every 6 (six) hours as needed for wheezing.   Yes Historical Provider, MD  azaTHIOprine (IMURAN) 50 MG tablet Take 50 mg by mouth 2 (two) times daily.   Yes Historical Provider, MD  calcium citrate-vitamin D (CITRACAL+D) 315-200 MG-UNIT per tablet Take 1 tablet by mouth daily.   Yes Historical Provider, MD  Cimetidine (TAGAMET PO) Take 1 tablet by mouth daily.   Yes Historical Provider,  MD  doxycycline (VIBRA-TABS) 100 MG tablet Take 100 mg by mouth 2 (two) times daily. Takes for 10 days.  First dose 12/16/2012.   Yes Historical Provider, MD  esomeprazole (NEXIUM) 40 MG capsule Take 40 mg by mouth daily before breakfast.   Yes Historical Provider, MD  Fluticasone-Salmeterol (ADVAIR) 250-50 MCG/DOSE AEPB Inhale 1 puff into the lungs every 12 (twelve) hours.   Yes Historical Provider, MD  folic acid (FOLVITE) 1 MG tablet Take 1 mg by mouth 2 (two) times daily.   Yes Historical Provider, MD  guaiFENesin (MUCINEX) 600 MG 12 hr tablet Take 600 mg by mouth 2 (two) times daily.   Yes Historical Provider, MD  IRON PO Take 1 tablet by mouth 4 (four) times daily.   Yes Historical Provider, MD  lisinopril (PRINIVIL,ZESTRIL) 10 MG tablet Take 10 mg by mouth daily.   Yes Historical Provider, MD  LORazepam (ATIVAN) 1 MG tablet Take 1 mg by mouth at bedtime.   Yes Historical Provider, MD  METFORMIN HCL ER PO Take 1 tablet by mouth every evening.   Yes Historical Provider, MD  niacin 500 MG tablet Take 500 mg by mouth daily.   Yes Historical Provider, MD  Omega-3 Fatty Acids (FISH OIL) 300 MG CAPS Take 1 capsule by mouth daily.   Yes Historical Provider, MD  oxybutynin (DITROPAN-XL) 10 MG 24 hr tablet Take 10 mg by mouth daily.   Yes Historical Provider, MD  PARoxetine (PAXIL) 30 MG tablet Take 30 mg by mouth daily.   Yes Historical Provider, MD  potassium chloride (K-DUR) 10 MEQ tablet Take 10 mEq by mouth daily.   Yes Historical Provider, MD  predniSONE (DELTASONE) 10 MG tablet Take 20 mg by mouth 2 (two) times daily.   Yes Historical Provider, MD  rosuvastatin (CRESTOR) 10 MG tablet Take 10 mg by mouth daily.   Yes Historical Provider, MD    Allergies:  No Known Allergies   Review of Systems: Constitutional:   No  weight loss, night sweats,  ?Fevers, +chills, +fatigue. HEENT:   No headaches,  Difficulty swallowing,  Tooth/dental problems,  Sore throat,                  No sneezing,  itching, ear ache, nasal congestion, post nasal drip,  CV:  +chest pain from coughing,  No orthopnea, no PND, no anasarca but notes pedal edema, no dizziness or palpitations GI:  No heartburn, indigestion, abdominal pain, nausea, vomiting, diarrhea, change in bowel habits, loss of appetite Resp:  Pos for cough, sm amt beige sput, no hemoptysis, +incr dyspnea & chest discomfort Skin: no rash or lesions. GU: no dysuria, change in color of urine, no urgency or frequency. No flank pain. MS:  No joint pain or swelling.  No decreased range of motion.  No back pain. Psych:  No change in  mood or affect. No depression or anxiety.  No memory loss.    Physical Exam:  Filed Vitals:   12/21/12 2058 12/22/12 0440 12/22/12 0906 12/22/12 1012  BP: 143/60 134/69  136/60  Pulse: 62 59    Temp: 98.7 F (37.1 C) 98.2 F (36.8 C)    TempSrc: Oral Oral    Resp: 16 18    Height:      Weight:  142 lb 3.2 oz (64.5 kg)    SpO2: 98% 95% 92%    Gen: Pleasant, well-nourished, in no distress,  flat affect ENT: No lesions,  mouth clear,  oropharynx clear, no postnasal drip Neck: No JVD, no TMG, no carotid bruits Lungs: No use of accessory muscles, no dullness to percussion, bilat velcro rales & scat rhonchi w/o consolidation Cardiovascular: RRR, heart sounds normal, no murmur or gallops, no peripheral edema Abdomen: soft and NT, no HSM,  BS normal Musculoskeletal: No deformities, no cyanosis or clubbing Neuro: alert, non focal Skin: Warm, no lesions or rashes   Imaging results:   12/20/2012   *RADIOLOGY REPORT*  Clinical Data: Shortness of breath, history pulmonary fibrosis, COPD, diabetes, hypertension  CHEST - 2 VIEW  Comparison: None  Findings: Upper normal heart size. Atherosclerotic calcification aorta. Pulmonary vascular congestion. Diffuse interstitial changes throughout both lungs. Bibasilar atelectasis versus scarring. No gross pleural effusion or additional segmental consolidation. No pneumothorax.  Bones appear demineralized.  IMPRESSION: Upper normal heart size with pulmonary vascular congestion. Bibasilar atelectasis versus scarring greater on left. Diffuse interstitial prominence, greatest at bases, could represent chronic interstitial lung disease/fibrosis though diffuse pulmonary edema or infection could also cause this appearance. No prior exams available to know baseline appearance of the lungs.   Original Report Authenticated By: Ulyses Southward, Rollins.D.    Labs: Results for orders placed during the hospital encounter of 12/20/12 (from the past 24 hour(s))  GLUCOSE, CAPILLARY     Status: Abnormal   Collection Time    12/21/12 11:05 AM      Result Value Range   Glucose-Capillary 244 (*) 70 - 99 mg/dL  GLUCOSE, CAPILLARY     Status: Abnormal   Collection Time    12/21/12  4:17 PM      Result Value Range   Glucose-Capillary 151 (*) 70 - 99 mg/dL  GLUCOSE, CAPILLARY     Status: Abnormal   Collection Time    12/21/12  9:08 PM      Result Value Range   Glucose-Capillary 169 (*) 70 - 99 mg/dL   Comment 1 Notify RN    BASIC METABOLIC PANEL     Status: Abnormal   Collection Time    12/22/12  3:50 AM      Result Value Range   Sodium 137  135 - 145 mEq/L   Potassium 3.9  3.5 - 5.1 mEq/L   Chloride 97  96 - 112 mEq/L   CO2 28  19 - 32 mEq/L   Glucose, Bld 238 (*) 70 - 99 mg/dL   BUN 31 (*) 6 - 23 mg/dL   Creatinine, Ser 4.54  0.50 - 1.10 mg/dL   Calcium 9.5  8.4 - 09.8 mg/dL   GFR calc non Af Amer >90  >90 mL/min   GFR calc Af Amer >90  >90 mL/min  GLUCOSE, CAPILLARY     Status: Abnormal   Collection Time    12/22/12  5:53 AM      Result Value Range   Glucose-Capillary 225 (*) 70 - 99 mg/dL  Comment 1 Notify RN       Assessment & Plan   1)  COPD, Acute on Chronic Hypoxemic Resp Failure w/ acute exac, Active smoker 1.5ppd>> 2)  Superimposed diffuse pulmonary fibrosis (IPF) on Imuran for yrs, well managed by DrHenderson in [place] w/o apparent progression>> 3)  OSA recently  diagnosed>>   Plan: -agree w/ [facility] to treat as COPD exac w/ Nebs, Antibiotics, IV Solumedrol, Mucinex, etc... -continue O2 by nasal cannula & monitor O2 sats -continue her Imuran and will notify DrRamaswami regarding his Perfenidone study... - medical management of her BP, DM, Anemia per [facility]...    Jessica Rollins 12/22/2012

## 2012-12-22 NOTE — Progress Notes (Signed)
  Echocardiogram 2D Echocardiogram has been performed.  Jessica Rollins FRANCES 12/22/2012, 5:26 PM

## 2012-12-23 LAB — GLUCOSE, CAPILLARY
Glucose-Capillary: 153 mg/dL — ABNORMAL HIGH (ref 70–99)
Glucose-Capillary: 112 mg/dL — ABNORMAL HIGH (ref 70–99)

## 2012-12-23 MED ORDER — METHYLPREDNISOLONE SODIUM SUCC 125 MG IJ SOLR
60.0000 mg | Freq: Two times a day (BID) | INTRAMUSCULAR | Status: DC
  Administered 2012-12-23 – 2012-12-24 (×2): 60 mg via INTRAVENOUS
  Filled 2012-12-23 (×4): qty 0.96

## 2012-12-23 MED ORDER — MOMETASONE FURO-FORMOTEROL FUM 200-5 MCG/ACT IN AERO
2.0000 | INHALATION_SPRAY | Freq: Two times a day (BID) | RESPIRATORY_TRACT | Status: DC
  Administered 2012-12-23 – 2012-12-24 (×2): 2 via RESPIRATORY_TRACT
  Filled 2012-12-23: qty 8.8

## 2012-12-23 MED ORDER — MOMETASONE FURO-FORMOTEROL FUM 200-5 MCG/ACT IN AERO
2.0000 | INHALATION_SPRAY | Freq: Two times a day (BID) | RESPIRATORY_TRACT | Status: DC
  Filled 2012-12-23: qty 8.8

## 2012-12-23 MED ORDER — LOSARTAN POTASSIUM 50 MG PO TABS
50.0000 mg | ORAL_TABLET | Freq: Every day | ORAL | Status: DC
  Administered 2012-12-23 – 2012-12-24 (×2): 50 mg via ORAL
  Filled 2012-12-23 (×2): qty 1

## 2012-12-23 NOTE — Progress Notes (Signed)
[facility] PROGRESS NOTE  Jessica Rollins ZOX:[id number] DOB: 02-Jan-1956 DOA: 12/20/2012 PCP: Maryann Conners., MD  Brief narrative [phone] female patient with history of chronic oxygen-dependent (2 L per minute) respiratory failure, COPD, OSA on CPAP, ILD (biopsy confirmed), smoker gives 2-3 week history of dry cough, worsening dyspnea and wheezing especially with minimal activity with associated oxygen desaturation in the 70s. She was seen by her pulmonologist and treated with steroids and antibiotics without significant relief. She noticed some swelling right ankle and? Pain yesterday. Due to persistent/worsening symptoms, patient presented to the hospital. She denies recent long-distance travel.  Assessment/Plan: 1. Acute on chronic respiratory failure: Chronic respiratory failure secondary to COPD, ILD and OSA. Acute decompensation secondary to COPD exacerbation. No features suggestive of overt heart failure. Other possibility is progressive ILD. Tobacco cessation counseled. Patient states that she feels fine at rest but with minimal activity becomes symptomatic. Continue oxygen, IV Solu-Medrol, bronchodilator nebulizations and doxycycline. Continue Imuran. Pulmonary input appreciated. Patient states she feels better-less dyspneic and no coughing with activity. 2-D echo: LVEF 55-60% 2. Type II DM: Hold metformin. Continue SSI and Lantus. A1c: 5.9. 3. HTN: Lisinopril changed to losartan due to cough. 4. Tobacco abuse: Patient continues to smoke 1.5 packs of cigarettes per day. Tobacco cessation counseled-reiterated today. 5. Anxiety and depression: Continue home medications. 6. GERD: Continue PPI and Pepcid. 7. HL: Continue home medications. 8. Remote history of DVT: 9. Anemia: Stable.   Code Status: Full Family Communication: None Disposition Plan: Not medically ready for DC.   Consultants:  Pulmonology  Procedures:  None  Antibiotics:  Doxycycline    HPI/Subjective: Overall feels much better-decreased dyspnea & no coughing with activity. Cough up some thick mucus overnight.  Objective: Filed Vitals:   12/23/12 0827 12/23/12 0842 12/23/12 1144 12/23/12 1431  BP: 133/48  138/59 126/58  Pulse: 67  64 65  Temp: 97.1 F (36.2 C)   98 F (36.7 C)  TempSrc: Oral   Oral  Resp: 19   18  Height:      Weight:      SpO2: 96% 93%  95%    Intake/Output Summary (Last 24 hours) at 12/23/12 1455 Last data filed at 12/23/12 1323  Gross per 24 hour  Intake    682 ml  Output   1100 ml  Net   -418 ml   Filed Weights   12/21/12 0613 12/22/12 0440 12/23/12 0549  Weight: 66.134 kg (145 lb 12.8 oz) 64.5 kg (142 lb 3.2 oz) 64 kg (141 lb 1.5 oz)    Exam:   General exam: Comfortable.  Respiratory system: Improved breath sounds bilaterally. No rhonchi. Bibasal coarse Velcro-like crackles right >  Left-seem less than yesterday.No increased work of breathing. Able to speak in full sentences  Cardiovascular system: S1 & S2 heard, RRR. No JVD, murmurs, gallops, clicks or pedal edema. Telemetry: Sinus bradycardia in the 50s-sinus rhythm.?? Short run of PAT  Gastrointestinal system: Abdomen is nondistended, soft and nontender. Normal bowel sounds heard.  Central nervous system: Alert and oriented. No focal neurological deficits.  Extremities: Symmetric 5 x 5 power. No acute findings in legs or right ankle.   Data Reviewed: Basic Metabolic Panel:  Recent Labs Lab 12/20/12 1249 12/20/12 2056 12/21/12 0530 12/22/12 0350  NA 141  --  137 137  K 3.6  --  4.1 3.9  CL 105  --  98 97  CO2 25  --  26 28  GLUCOSE 140*  --  185* 238*  BUN 12  --  19 31*  CREATININE 0.46* 0.55 0.50 0.54  CALCIUM 9.2  --  9.0 9.5   Liver Function Tests:  Recent Labs Lab 12/21/12 0530  AST 9  ALT 12  ALKPHOS 85  BILITOT 0.3  PROT 6.6  ALBUMIN 3.2*   No results found for this basename: LIPASE, AMYLASE,  in the last 168 hours No results found for  this basename: AMMONIA,  in the last 168 hours CBC:  Recent Labs Lab 12/20/12 1249 12/20/12 2056 12/21/12 0530  WBC 13.8* 9.1 7.7  NEUTROABS 12.7*  --   --   HGB 10.6* 11.7* 11.5*  HCT 34.1* 37.3 36.9  MCV 95.3 95.6 94.9  PLT 413* 439* 407*   Cardiac Enzymes:  Recent Labs Lab 12/20/12 1249  TROPONINI <0.30   BNP (last 3 results)  Recent Labs  12/20/12 1249  PROBNP 840.2*   CBG:  Recent Labs Lab 12/22/12 1106 12/22/12 1603 12/22/12 2111 12/23/12 0552 12/23/12 1130  GLUCAP 156* 179* 149* 153* 196*    No results found for this or any previous visit (from the past 240 hour(s)).   Studies: No results found.   Additional labs:   Scheduled Meds: . albuterol  2.5 mg Nebulization Q6H  . azaTHIOprine  50 mg Oral BID  . doxycycline  100 mg Oral BID  . famotidine  10 mg Oral Daily  . folic acid  1 mg Oral BID  . guaiFENesin  600 mg Oral BID  . heparin  5,000 Units Subcutaneous Q8H  . insulin aspart  0-15 Units Subcutaneous TID WC  . insulin aspart  3 Units Subcutaneous TID WC  . LORazepam  1 mg Oral QHS  . losartan  50 mg Oral Daily  . methylPREDNISolone (SOLU-MEDROL) injection  60 mg Intravenous Q12H  . mometasone-formoterol  2 puff Inhalation BID  . nicotine  21 mg Transdermal Daily  . oxybutynin  10 mg Oral QHS  . pantoprazole  40 mg Oral Daily  . PARoxetine  30 mg Oral Daily  . potassium chloride  10 mEq Oral QHS  . sodium chloride  3 mL Intravenous Q12H  . tiotropium  18 mcg Inhalation Daily   Continuous Infusions:   Principal Problem:   Acute-on-chronic respiratory failure Active Problems:   Pulmonary fibrosis   COPD (chronic obstructive pulmonary disease)   Diabetes mellitus without complication   OSA on CPAP   Hypertension   Anxiety   Overactive bladder   COPD exacerbation    Time spent: 20 minutes.    [facility]  [facility] Pager [phone].   If 8PM-8AM, please contact night-coverage at [email or web address], password  [facility] 12/23/2012, 2:55 PM  LOS: 3 days

## 2012-12-23 NOTE — Consult Note (Addendum)
PULMONARY/ CCM CONSULT  Patient name: Jessica Rollins Medical record number: [id number] Date of birth: 1956/01/05 Age: 57 y.o. Gender: female PCP: Joanna Hews Rutherford Guys., MD  Reason for Consult:  Dyspnea, Cough Referring Physician:  [facility]  History of Present Illness: 58 y/o WF from [place], [place].  Expertly cared for (for many yrs) by her local Pulmonologist, DrHenderson.  We do not have any records and she has never been seen in [place] previously.  She says she came here because she did not want to go to the local hosp in [place].  She reports a long hx of COPD on chronic home oxygen at 2L/min, still smokes 1.5ppd, and has superimposed diffuse pulm fibrosis diagnosed >72yrs ago w/ lung biopsy & treated for many yrs on Imuran & pulse Prednisone courses for resp exacerbations...  She was in her usual state of health until about 1 week ago- noted increased cough, small amt beige sputum w/o hemoptysis, increased SOB & some chest discomfort from the coughing.  She saw DrHenderson's team & had CXR (no change), and was placed on Prednisone, Doxycycline, & a Nebulizer.  Her O2 sats were 79 by her report on RA w/ ambulation in the office.  She had also recently had a Sleep Study which she reports was pos for OSA.  They had arranged for additional studies including a walk test, but she decided to come to [facility] due to her acute dyspnea, which seems to be mostly an acute exacerbation of her COPD due to her continued smoking... MEDS>  Apparently prev on Advair & Spiriva- recently switched to Tudorza & NEBS; Pred10mg /d; Doxy100mg Bid; Imuran50mg Bid, Mucinex600mg Bid   Physical Exam:  Filed Vitals:   12/22/12 2134 12/23/12 0549 12/23/12 0827 12/23/12 0842  BP: 147/58 163/82 133/48   Pulse: 72 62 67   Temp: 98 F (36.7 C) 98 F (36.7 C) 97.1 F (36.2 C)   TempSrc: Oral Oral Oral   Resp: 20 20 19    Height:      Weight:  64 kg (141 lb 1.5 oz)     SpO2: 94% 92% 96% 93%   Gen: Pleasant, well-nourished, in no distress,  flat affect ENT: No lesions,  mouth clear,  oropharynx clear, no postnasal drip Neck: No JVD, no TMG, no carotid bruits Lungs: No use of accessory muscles, no dullness to percussion, bilat velcro rales & scat rhonchi w/o consolidation Cardiovascular: RRR, heart sounds normal, no murmur or gallops, no peripheral edema Abdomen: soft and NT, no HSM,  BS normal Musculoskeletal: No deformities, no cyanosis or clubbing Neuro: alert, non focal Skin: Warm, no lesions or rashes   Imaging results:   12/20/2012   *RADIOLOGY REPORT*  Clinical Data: Shortness of breath, history pulmonary fibrosis, COPD, diabetes, hypertension  CHEST - 2 VIEW  Comparison: None  Findings: Upper normal heart size. Atherosclerotic calcification aorta. Pulmonary vascular congestion. Diffuse interstitial changes throughout both lungs. Bibasilar atelectasis versus scarring. No gross pleural effusion or additional segmental consolidation. No pneumothorax. Bones appear demineralized.  IMPRESSION: Upper normal heart size with pulmonary vascular congestion. Bibasilar atelectasis versus scarring greater on left. Diffuse interstitial prominence, greatest at bases, could represent chronic interstitial lung disease/fibrosis though diffuse pulmonary edema or infection could also cause this appearance. No prior exams available to know baseline appearance of the lungs.   Original Report Authenticated By: Ulyses Southward, M.D.    Labs: Assessment & Plan   1)  COPD, Acute on Chronic Hypoxemic Resp Failure w/ acute exac, Active smoker 1.5ppd>> 2)  Superimposed diffuse pulmonary fibrosis (IPF) on Imuran for yrs, well managed by DrHenderson in [place] w/o apparent progression>> 3)  OSA recently diagnosed>>   Plan: -cont steroids , BD, oxygen , ABX -add flutter valve -d/c ace inhibitor, start losartan 50mg  /d  D/t cough -increase dulera to 200 two puff bid    Dorcas Carrow 12/23/2012

## 2012-12-23 NOTE — Progress Notes (Signed)
Patient has home CPAP unit in room and will place on self.

## 2012-12-24 DIAGNOSIS — F411 Generalized anxiety disorder: Secondary | ICD-10-CM

## 2012-12-24 DIAGNOSIS — J449 Chronic obstructive pulmonary disease, unspecified: Secondary | ICD-10-CM

## 2012-12-24 LAB — GLUCOSE, CAPILLARY: Glucose-Capillary: 283 mg/dL — ABNORMAL HIGH (ref 70–99)

## 2012-12-24 MED ORDER — PREDNISONE 5 MG PO TABS: ORAL_TABLET | ORAL | Status: DC

## 2012-12-24 MED ORDER — LOSARTAN POTASSIUM 50 MG PO TABS: 50.0000 mg | ORAL_TABLET | Freq: Every day | ORAL | Status: DC

## 2012-12-24 MED ORDER — NICOTINE 21 MG/24HR TD PT24: 1.0000 | MEDICATED_PATCH | Freq: Every day | TRANSDERMAL | Status: DC

## 2012-12-24 NOTE — Plan of Care (Signed)
Problem: ICU Phase Progression Outcomes Goal: O2 sats trending toward baseline Outcome: Progressing Patient continues to receive IV Solu-Medrol and nebulizers for COPD exacerbation.  Given flutter valve, instructions reviewed and left with patient to review PRN.  Respiratory status improving.  Will continue to monitor.

## 2012-12-24 NOTE — Progress Notes (Signed)
Jessica Rollins to be D/C'd Home per MD order.  Discussed with the patient and all questions fully answered.    Medication List    STOP taking these medications       lisinopril 10 MG tablet  Commonly known as:  PRINIVIL,ZESTRIL      TAKE these medications       Aclidinium Bromide 400 MCG/ACT Aepb  Inhale 1 Inhaler into the lungs 2 (two) times daily.     albuterol 108 (90 BASE) MCG/ACT inhaler  Commonly known as:  PROVENTIL HFA;VENTOLIN HFA  Inhale 2 puffs into the lungs every 6 (six) hours as needed for wheezing.     azaTHIOprine 50 MG tablet  Commonly known as:  IMURAN  Take 50 mg by mouth 2 (two) times daily.     calcium citrate-vitamin D 315-200 MG-UNIT per tablet  Commonly known as:  CITRACAL+D  Take 1 tablet by mouth daily.     doxycycline 100 MG tablet  Commonly known as:  VIBRA-TABS  Take 100 mg by mouth 2 (two) times daily. Takes for 10 days.  First dose 12/16/2012.     esomeprazole 40 MG capsule  Commonly known as:  NEXIUM  Take 40 mg by mouth daily before breakfast.     Fish Oil 300 MG Caps  Take 1 capsule by mouth daily.     Fluticasone-Salmeterol 250-50 MCG/DOSE Aepb  Commonly known as:  ADVAIR  Inhale 1 puff into the lungs every 12 (twelve) hours.     folic acid 1 MG tablet  Commonly known as:  FOLVITE  Take 1 mg by mouth 2 (two) times daily.     guaiFENesin 600 MG 12 hr tablet  Commonly known as:  MUCINEX  Take 600 mg by mouth 2 (two) times daily.     IRON PO  Take 1 tablet by mouth 4 (four) times daily.     LORazepam 1 MG tablet  Commonly known as:  ATIVAN  Take 1 mg by mouth at bedtime.     losartan 50 MG tablet  Commonly known as:  COZAAR  Take 1 tablet (50 mg total) by mouth daily.     METFORMIN HCL ER PO  Take 1 tablet by mouth every evening.     niacin 500 MG tablet  Take 500 mg by mouth daily.     nicotine 21 mg/24hr patch  Commonly known as:  NICODERM CQ - dosed in mg/24 hours  Place 1 patch onto the skin daily.     oxybutynin  10 MG 24 hr tablet  Commonly known as:  DITROPAN-XL  Take 10 mg by mouth daily.     PARoxetine 30 MG tablet  Commonly known as:  PAXIL  Take 30 mg by mouth daily.     potassium chloride 10 MEQ tablet  Commonly known as:  K-DUR  Take 10 mEq by mouth daily.     predniSONE 5 MG tablet  Commonly known as:  DELTASONE  Label  & dispense according to the schedule below. 10 Pills PO for 3 days then, 8 Pills PO for 3 days, 6 Pills PO for 3 days, 4 Pills PO for 3 days, then continue you are 10 mg daily dose as usual     rosuvastatin 10 MG tablet  Commonly known as:  CRESTOR  Take 10 mg by mouth daily.     TAGAMET PO  Take 1 tablet by mouth daily.        VVS, Skin clean, dry and intact without  evidence of skin break down, no evidence of skin tears noted. IV catheter discontinued intact. Site without signs and symptoms of complications. Dressing and pressure applied.  An After Visit Summary was printed and given to the patient. Patient escorted via WC, and D/C home via private auto.  Jessica Rollins 12/24/2012 2:47 PM

## 2012-12-24 NOTE — Progress Notes (Signed)
UR chart review completed.  

## 2012-12-24 NOTE — Discharge Summary (Signed)
[facility]                                                                                   Jessica Rollins, is a 57 y.o. female  DOB 1955/12/30  MRN [id number].  Admission date:  12/20/2012  Discharge Date:  12/24/2012  Primary MD  Maryann Conners., MD  Admitting Physician  Gilda Crease, MD  Admission Diagnosis  COPD (chronic obstructive pulmonary disease) [496] Pulmonary fibrosis [515] Diabetes mellitus without complication [250.00] Anxiety [300.00] Hypertension [401.9] Overactive bladder [596.51] OSA on CPAP [327.23]  Discharge Diagnosis     Principal Problem:   Acute-on-chronic respiratory failure Active Problems:   Pulmonary fibrosis   COPD (chronic obstructive pulmonary disease)   Diabetes mellitus without complication   OSA on CPAP   Hypertension   Anxiety   Overactive bladder   COPD exacerbation    Past Medical History  Diagnosis Date  . COPD (chronic obstructive pulmonary disease)   . Pulmonary fibrosis     diagnosed with this 2001.    . Diabetes mellitus without complication   . Hiatal hernia   . Depression   . Anxiety   . High cholesterol   . Hypertension   . Osteoporosis   . OSA on CPAP     13cm  . Overactive bladder     Past Surgical History  Procedure Laterality Date  . Bladder suspension    . Abdominal hysterectomy      severe bleeding-when she was in her 30's     Recommendations for primary care physician for things to follow:       Discharge Diagnoses:   Principal Problem:   Acute-on-chronic respiratory failure Active Problems:   Pulmonary fibrosis   COPD (chronic obstructive pulmonary disease)   Diabetes mellitus without complication   OSA on CPAP   Hypertension   Anxiety   Overactive bladder   COPD exacerbation    Discharge Condition: STABLE   Diet recommendation: See Discharge Instructions below   Consults PCCM    History of present illness and  Hospital Course:     Kindly see H&P  for history of present illness and admission details, please review complete Labs, Consult reports and Test reports for all details in brief Jessica Rollins, is a 57 y.o. female, patient with history of chronic oxygen-dependent (2 L per minute) respiratory failure, COPD, OSA on CPAP, ILD (biopsy confirmed), smoker gives 2-3 week history of dry cough, worsening dyspnea and wheezing especially with minimal activity with associated oxygen desaturation in the 70s. She was seen by her pulmonologist and treated with steroids and antibiotics without significant relief. She noticed some swelling right ankle and? Pain yesterday. Due to persistent/worsening symptoms, patient presented to the hospital. She denies recent long-distance travel. He is she was diagnosed with acute on chronic respiratory failure due to combination of her above dictated lung issues, she was treated here with IV Solu-Medrol, few doses of Lasix IV, bronchodilators along with doxycycline, her Imuran was continued, she returned to her baseline, she was seen by pulmonology I discussed the case with Dr. Shan Levans today and she is stable to go home on steroid taper, of note  patient takes 10 mg of prednisone a chronic basis she quit she will continued to at the end of a taper, she also uses home oxygen which she will continue to use. She will follow with her pulmonologist in [place] within a week post discharge.   She is been counseled to quit smoking, nicotine patches have been provided.  Type 2 diabetes mellitus she will continue her home regimen post discharge.  For her hypertension home medications will be continued except her lisinopril has been switched to ARB under the guidance of pulmonary for her chronic dry cough.      Today   Subjective:   Jessica Rollins today has no headache,no chest abdominal pain,no new weakness tingling or numbness, feels much better wants to go home today.    Objective:   Blood pressure 154/68, pulse 62,  temperature 97.4 F (36.3 C), temperature source Oral, resp. rate 18, height 5\' 3"  (1.6 m), weight 63.821 kg (140 lb 11.2 oz), SpO2 96.00%.   Intake/Output Summary (Last 24 hours) at 12/24/12 1121 Last data filed at 12/24/12 0900  Gross per 24 hour  Intake   1004 ml  Output   1100 ml  Net    -96 ml    Exam Awake Alert, Oriented *3, No new F.N deficits, Normal affect [place].AT,PERRAL Supple Neck,No JVD, No cervical lymphadenopathy appriciated.  Symmetrical Chest wall movement, Good air movement bilaterally, few rales RRR,No Gallops,Rubs or new Murmurs, No Parasternal Heave +ve B.Sounds, Abd Soft, Non tender, No organomegaly appriciated, No rebound -guarding or rigidity. No Cyanosis, Clubbing or edema, No new Rash or bruise  Data Review   Major procedures and Radiology Reports - PLEASE review detailed and final reports for all details in brief -       Dg Chest 2 View  12/20/2012   *RADIOLOGY REPORT*  Clinical Data: Shortness of breath, history pulmonary fibrosis, COPD, diabetes, hypertension  CHEST - 2 VIEW  Comparison: None  Findings: Upper normal heart size. Atherosclerotic calcification aorta. Pulmonary vascular congestion. Diffuse interstitial changes throughout both lungs. Bibasilar atelectasis versus scarring. No gross pleural effusion or additional segmental consolidation. No pneumothorax. Bones appear demineralized.  IMPRESSION: Upper normal heart size with pulmonary vascular congestion. Bibasilar atelectasis versus scarring greater on left. Diffuse interstitial prominence, greatest at bases, could represent chronic interstitial lung disease/fibrosis though diffuse pulmonary edema or infection could also cause this appearance. No prior exams available to know baseline appearance of the lungs.   Original Report Authenticated By: Ulyses Southward, M.D.    Micro Results      No results found for this or any previous visit (from the past 240 hour(s)).   CBC w Diff: Lab Results   Component Value Date   WBC 7.7 12/21/2012   HGB 11.5* 12/21/2012   HCT 36.9 12/21/2012   PLT 407* 12/21/2012   LYMPHOPCT 5* 12/20/2012   MONOPCT 3 12/20/2012   EOSPCT 0 12/20/2012   BASOPCT 0 12/20/2012    CMP: Lab Results  Component Value Date   NA 137 12/22/2012   K 3.9 12/22/2012   CL 97 12/22/2012   CO2 28 12/22/2012   BUN 31* 12/22/2012   CREATININE 0.54 12/22/2012   PROT 6.6 12/21/2012   ALBUMIN 3.2* 12/21/2012   BILITOT 0.3 12/21/2012   ALKPHOS 85 12/21/2012   AST 9 12/21/2012   ALT 12 12/21/2012  .   Discharge Instructions     Follow with Primary MD Joanna Hews Rutherford Guys., MD in 7 days   Get CBC, CMP,  checked 7 days by Primary MD and again as instructed by your Primary MD. Get a 2 view Chest X ray done next visit.  Get Medicines reviewed and adjusted.  Please request your Prim.MD to go over all Hospital Tests and Procedure/Radiological results at the follow up, please get all Hospital records sent to your Prim MD by signing hospital release before you go home.  Activity: As tolerated with Full fall precautions use walker/cane & assistance as needed   Diet:  Heart healthy  For Heart failure patients - Check your Weight same time everyday, if you gain over 2 pounds, or you develop in leg swelling, experience more shortness of breath or chest pain, call your Primary MD immediately. Follow Cardiac Low Salt Diet and 1.8 lit/day fluid restriction.  Disposition Home    If you experience worsening of your admission symptoms, develop shortness of breath, life threatening emergency, suicidal or homicidal thoughts you must seek medical attention immediately by calling 911 or calling your MD immediately  if symptoms less severe.  You Must read complete instructions/literature along with all the possible adverse reactions/side effects for all the Medicines you take and that have been prescribed to you. Take any new Medicines after you have completely understood and accpet all the possible  adverse reactions/side effects.   Do not drive and provide baby sitting services if your were admitted for syncope or siezures until you have seen by Primary MD or a Neurologist and advised to do so again.  Do not drive when taking Pain medications.    Do not take more than prescribed Pain, Sleep and Anxiety Medications  Special Instructions: If you have smoked or chewed Tobacco  in the last 2 yrs please stop smoking, stop any regular Alcohol  and or any Recreational drug use.  Wear Seat belts while driving.   Please note  You were cared for by a hospitalist during your hospital stay. If you have any questions about your discharge medications or the care you received while you were in the hospital after you are discharged, you can call the unit and asked to speak with the hospitalist on call if the hospitalist that took care of you is not available. Once you are discharged, your primary care physician will handle any further medical issues. Please note that NO REFILLS for any discharge medications will be authorized once you are discharged, as it is imperative that you return to your primary care physician (or establish a relationship with a primary care physician if you do not have one) for your aftercare needs so that they can reassess your need for medications and monitor your lab values.    Follow-up Information   Follow up with Henerson, Rutherford Guys., MD. Schedule an appointment as soon as possible for a visit in 1 week. (And your lung doctor in a week)    Contact information:   [address] Darcel Sm[address] [place] [address] [phone]         Discharge Medications     Medication List    STOP taking these medications       lisinopril 10 MG tablet  Commonly known as:  PRINIVIL,ZESTRIL      TAKE these medications       Aclidinium Bromide 400 MCG/ACT Aepb  Inhale 1 Inhaler into the lungs 2 (two) times daily.     albuterol 108 (90 BASE) MCG/ACT inhaler  Commonly known  as:  PROVENTIL HFA;VENTOLIN HFA  Inhale 2 puffs into the lungs every  6 (six) hours as needed for wheezing.     azaTHIOprine 50 MG tablet  Commonly known as:  IMURAN  Take 50 mg by mouth 2 (two) times daily.     calcium citrate-vitamin D 315-200 MG-UNIT per tablet  Commonly known as:  CITRACAL+D  Take 1 tablet by mouth daily.     doxycycline 100 MG tablet  Commonly known as:  VIBRA-TABS  Take 100 mg by mouth 2 (two) times daily. Takes for 10 days.  First dose 12/16/2012.     esomeprazole 40 MG capsule  Commonly known as:  NEXIUM  Take 40 mg by mouth daily before breakfast.     Fish Oil 300 MG Caps  Take 1 capsule by mouth daily.     Fluticasone-Salmeterol 250-50 MCG/DOSE Aepb  Commonly known as:  ADVAIR  Inhale 1 puff into the lungs every 12 (twelve) hours.     folic acid 1 MG tablet  Commonly known as:  FOLVITE  Take 1 mg by mouth 2 (two) times daily.     guaiFENesin 600 MG 12 hr tablet  Commonly known as:  MUCINEX  Take 600 mg by mouth 2 (two) times daily.     IRON PO  Take 1 tablet by mouth 4 (four) times daily.     LORazepam 1 MG tablet  Commonly known as:  ATIVAN  Take 1 mg by mouth at bedtime.     losartan 50 MG tablet  Commonly known as:  COZAAR  Take 1 tablet (50 mg total) by mouth daily.     METFORMIN HCL ER PO  Take 1 tablet by mouth every evening.     niacin 500 MG tablet  Take 500 mg by mouth daily.     nicotine 21 mg/24hr patch  Commonly known as:  NICODERM CQ - dosed in mg/24 hours  Place 1 patch onto the skin daily.     oxybutynin 10 MG 24 hr tablet  Commonly known as:  DITROPAN-XL  Take 10 mg by mouth daily.     PARoxetine 30 MG tablet  Commonly known as:  PAXIL  Take 30 mg by mouth daily.     potassium chloride 10 MEQ tablet  Commonly known as:  K-DUR  Take 10 mEq by mouth daily.     predniSONE 5 MG tablet  Commonly known as:  DELTASONE  Label  & dispense according to the schedule below. 10 Pills PO for 3 days then, 8 Pills PO for 3  days, 6 Pills PO for 3 days, 4 Pills PO for 3 days, then continue you are 10 mg daily dose as usual     rosuvastatin 10 MG tablet  Commonly known as:  CRESTOR  Take 10 mg by mouth daily.     TAGAMET PO  Take 1 tablet by mouth daily.           Total Time in preparing paper work, data evaluation and todays exam - 35 minutes  Leroy Sea M.D on 12/24/2012 at 11:21 AM  [facility] Office  [phone]

## 2013-08-07 ENCOUNTER — Inpatient Hospital Stay (HOSPITAL_COMMUNITY)
Admission: EM | Admit: 2013-08-07 | Discharge: 2013-08-11 | DRG: 193 | Disposition: A | Discharge status: Other Acute Inpatient Hospital | Payer: Medicare Other | Attending: Internal Medicine | Admitting: Internal Medicine

## 2013-08-07 ENCOUNTER — Encounter (HOSPITAL_COMMUNITY): Payer: Self-pay | Admitting: Emergency Medicine

## 2013-08-07 ENCOUNTER — Inpatient Hospital Stay (HOSPITAL_COMMUNITY): Payer: Medicare Other

## 2013-08-07 ENCOUNTER — Emergency Department (HOSPITAL_COMMUNITY): Payer: Medicare Other

## 2013-08-07 DIAGNOSIS — F419 Anxiety disorder, unspecified: Secondary | ICD-10-CM | POA: Diagnosis present

## 2013-08-07 DIAGNOSIS — E785 Hyperlipidemia, unspecified: Secondary | ICD-10-CM | POA: Diagnosis present

## 2013-08-07 DIAGNOSIS — N3281 Overactive bladder: Secondary | ICD-10-CM

## 2013-08-07 DIAGNOSIS — J449 Chronic obstructive pulmonary disease, unspecified: Secondary | ICD-10-CM | POA: Diagnosis present

## 2013-08-07 DIAGNOSIS — J9620 Acute and chronic respiratory failure, unspecified whether with hypoxia or hypercapnia: Secondary | ICD-10-CM | POA: Diagnosis present

## 2013-08-07 DIAGNOSIS — J8410 Pulmonary fibrosis, unspecified: Secondary | ICD-10-CM | POA: Diagnosis present

## 2013-08-07 DIAGNOSIS — F341 Dysthymic disorder: Secondary | ICD-10-CM | POA: Diagnosis present

## 2013-08-07 DIAGNOSIS — N390 Urinary tract infection, site not specified: Secondary | ICD-10-CM | POA: Diagnosis present

## 2013-08-07 DIAGNOSIS — G4733 Obstructive sleep apnea (adult) (pediatric): Secondary | ICD-10-CM | POA: Diagnosis present

## 2013-08-07 DIAGNOSIS — K449 Diaphragmatic hernia without obstruction or gangrene: Secondary | ICD-10-CM | POA: Diagnosis present

## 2013-08-07 DIAGNOSIS — K219 Gastro-esophageal reflux disease without esophagitis: Secondary | ICD-10-CM | POA: Diagnosis present

## 2013-08-07 DIAGNOSIS — E119 Type 2 diabetes mellitus without complications: Secondary | ICD-10-CM | POA: Diagnosis present

## 2013-08-07 DIAGNOSIS — J189 Pneumonia, unspecified organism: Principal | ICD-10-CM | POA: Diagnosis present

## 2013-08-07 DIAGNOSIS — J9600 Acute respiratory failure, unspecified whether with hypoxia or hypercapnia: Secondary | ICD-10-CM

## 2013-08-07 DIAGNOSIS — I10 Essential (primary) hypertension: Secondary | ICD-10-CM | POA: Diagnosis present

## 2013-08-07 DIAGNOSIS — J9601 Acute respiratory failure with hypoxia: Secondary | ICD-10-CM | POA: Diagnosis present

## 2013-08-07 DIAGNOSIS — N318 Other neuromuscular dysfunction of bladder: Secondary | ICD-10-CM | POA: Diagnosis present

## 2013-08-07 DIAGNOSIS — Z9981 Dependence on supplemental oxygen: Secondary | ICD-10-CM

## 2013-08-07 DIAGNOSIS — E7800 Pure hypercholesterolemia, unspecified: Secondary | ICD-10-CM | POA: Diagnosis present

## 2013-08-07 DIAGNOSIS — M810 Age-related osteoporosis without current pathological fracture: Secondary | ICD-10-CM | POA: Diagnosis present

## 2013-08-07 DIAGNOSIS — R011 Cardiac murmur, unspecified: Secondary | ICD-10-CM | POA: Diagnosis present

## 2013-08-07 DIAGNOSIS — F17200 Nicotine dependence, unspecified, uncomplicated: Secondary | ICD-10-CM | POA: Diagnosis present

## 2013-08-07 DIAGNOSIS — F411 Generalized anxiety disorder: Secondary | ICD-10-CM

## 2013-08-07 DIAGNOSIS — IMO0002 Reserved for concepts with insufficient information to code with codable children: Secondary | ICD-10-CM

## 2013-08-07 DIAGNOSIS — J441 Chronic obstructive pulmonary disease with (acute) exacerbation: Secondary | ICD-10-CM | POA: Diagnosis present

## 2013-08-07 DIAGNOSIS — R918 Other nonspecific abnormal finding of lung field: Secondary | ICD-10-CM | POA: Diagnosis present

## 2013-08-07 DIAGNOSIS — Z9989 Dependence on other enabling machines and devices: Secondary | ICD-10-CM

## 2013-08-07 DIAGNOSIS — Z79899 Other long term (current) drug therapy: Secondary | ICD-10-CM

## 2013-08-07 HISTORY — DX: Cardiac murmur, unspecified: R01.1

## 2013-08-07 LAB — URINE MICROSCOPIC-ADD ON

## 2013-08-07 LAB — CBC WITH DIFFERENTIAL/PLATELET
Basophils Absolute: 0 10*3/uL (ref 0.0–0.1)
Basophils Relative: 0 % (ref 0–1)
EOS ABS: 0.3 10*3/uL (ref 0.0–0.7)
Eosinophils Relative: 3 % (ref 0–5)
HCT: 37.2 % (ref 36.0–46.0)
HEMOGLOBIN: 12.1 g/dL (ref 12.0–15.0)
LYMPHS ABS: 1.5 10*3/uL (ref 0.7–4.0)
Lymphocytes Relative: 13 % (ref 12–46)
MCH: 28.5 pg (ref 26.0–34.0)
MCHC: 32.5 g/dL (ref 30.0–36.0)
MCV: 87.7 fL (ref 78.0–100.0)
MONO ABS: 0.8 10*3/uL (ref 0.1–1.0)
MONOS PCT: 7 % (ref 3–12)
NEUTROS PCT: 78 % — AB (ref 43–77)
Neutro Abs: 9.2 10*3/uL — ABNORMAL HIGH (ref 1.7–7.7)
Platelets: 378 10*3/uL (ref 150–400)
RBC: 4.24 MIL/uL (ref 3.87–5.11)
RDW: 17.8 % — ABNORMAL HIGH (ref 11.5–15.5)
WBC: 11.8 10*3/uL — ABNORMAL HIGH (ref 4.0–10.5)

## 2013-08-07 LAB — URINALYSIS, ROUTINE W REFLEX MICROSCOPIC
Glucose, UA: >1000 mg/dL — AB
KETONES UR: 15 mg/dL — AB
NITRITE: NEGATIVE
PH: 6 (ref 5.0–8.0)
PROTEIN: 100 mg/dL — AB
Specific Gravity, Urine: 1.042 — ABNORMAL HIGH (ref 1.005–1.030)
Urobilinogen, UA: 1 mg/dL (ref 0.0–1.0)

## 2013-08-07 LAB — BASIC METABOLIC PANEL
BUN: 15 mg/dL (ref 6–23)
CHLORIDE: 99 meq/L (ref 96–112)
CO2: 25 mEq/L (ref 19–32)
Calcium: 9 mg/dL (ref 8.4–10.5)
Creatinine, Ser: 0.4 mg/dL — ABNORMAL LOW (ref 0.50–1.10)
GFR calc non Af Amer: >90 mL/min (ref >90)
Glucose, Bld: 280 mg/dL — ABNORMAL HIGH (ref 70–99)
POTASSIUM: 3.7 meq/L (ref 3.7–5.3)
Sodium: 139 mEq/L (ref 137–147)

## 2013-08-07 LAB — COMPREHENSIVE METABOLIC PANEL
ALBUMIN: 2.6 g/dL — AB (ref 3.5–5.2)
ALT: 7 U/L (ref 0–35)
AST: 9 U/L (ref 0–37)
Alkaline Phosphatase: 101 U/L (ref 39–117)
BUN: 15 mg/dL (ref 6–23)
CO2: 25 mEq/L (ref 19–32)
Calcium: 8.9 mg/dL (ref 8.4–10.5)
Chloride: 100 mEq/L (ref 96–112)
Creatinine, Ser: 0.45 mg/dL — ABNORMAL LOW (ref 0.50–1.10)
GFR calc Af Amer: >90 mL/min (ref >90)
GFR calc non Af Amer: >90 mL/min (ref >90)
Glucose, Bld: 186 mg/dL — ABNORMAL HIGH (ref 70–99)
POTASSIUM: 3.7 meq/L (ref 3.7–5.3)
SODIUM: 142 meq/L (ref 137–147)
TOTAL PROTEIN: 6.8 g/dL (ref 6.0–8.3)

## 2013-08-07 LAB — STREP PNEUMONIAE URINARY ANTIGEN: Strep Pneumo Urinary Antigen: NEGATIVE

## 2013-08-07 LAB — CBC
HEMATOCRIT: 37.6 % (ref 36.0–46.0)
Hemoglobin: 12.4 g/dL (ref 12.0–15.0)
MCH: 28.9 pg (ref 26.0–34.0)
MCHC: 33 g/dL (ref 30.0–36.0)
MCV: 87.6 fL (ref 78.0–100.0)
Platelets: 389 10*3/uL (ref 150–400)
RBC: 4.29 MIL/uL (ref 3.87–5.11)
RDW: 17.9 % — AB (ref 11.5–15.5)
WBC: 12.7 10*3/uL — ABNORMAL HIGH (ref 4.0–10.5)

## 2013-08-07 LAB — MAGNESIUM: Magnesium: 2 mg/dL (ref 1.5–2.5)

## 2013-08-07 LAB — PROTIME-INR
INR: 0.99 (ref 0.00–1.49)
PROTHROMBIN TIME: 12.9 s (ref 11.6–15.2)

## 2013-08-07 LAB — POCT I-STAT TROPONIN I: TROPONIN I, POC: 0.02 ng/mL (ref 0.00–0.08)

## 2013-08-07 LAB — APTT: aPTT: 27 seconds (ref 24–37)

## 2013-08-07 LAB — INFLUENZA PANEL BY PCR (TYPE A & B)
H1N1 flu by pcr: NOT DETECTED
Influenza A By PCR: NEGATIVE
Influenza B By PCR: NEGATIVE

## 2013-08-07 LAB — PRO B NATRIURETIC PEPTIDE: PRO B NATRI PEPTIDE: 901.6 pg/mL — AB (ref 0–125)

## 2013-08-07 LAB — PHOSPHORUS: Phosphorus: 4 mg/dL (ref 2.3–4.6)

## 2013-08-07 MED ORDER — METFORMIN HCL ER 500 MG PO TB24: 500.0000 mg | ORAL_TABLET | Freq: Two times a day (BID) | ORAL | Status: DC

## 2013-08-07 MED ORDER — DEXTROSE 5 % IV SOLN
500.0000 mg | Freq: Once | INTRAVENOUS | Status: AC
  Administered 2013-08-07: 500 mg via INTRAVENOUS

## 2013-08-07 MED ORDER — SODIUM CHLORIDE 0.9 % IV SOLN: INTRAVENOUS | Status: AC

## 2013-08-07 MED ORDER — ACETAMINOPHEN 650 MG RE SUPP: 650.0000 mg | Freq: Four times a day (QID) | RECTAL | Status: DC | PRN

## 2013-08-07 MED ORDER — IPRATROPIUM BROMIDE 0.02 % IN SOLN
0.5000 mg | RESPIRATORY_TRACT | Status: DC
  Administered 2013-08-07: 0.5 mg via RESPIRATORY_TRACT
  Filled 2013-08-07: qty 2.5

## 2013-08-07 MED ORDER — OMEGA-3-ACID ETHYL ESTERS 1 G PO CAPS
1.0000 g | ORAL_CAPSULE | Freq: Every day | ORAL | Status: DC
  Administered 2013-08-07 – 2013-08-11 (×5): 1 g via ORAL
  Filled 2013-08-07 (×5): qty 1

## 2013-08-07 MED ORDER — ALBUTEROL SULFATE (2.5 MG/3ML) 0.083% IN NEBU
5.0000 mg | INHALATION_SOLUTION | Freq: Four times a day (QID) | RESPIRATORY_TRACT | Status: DC
  Administered 2013-08-08 – 2013-08-09 (×6): 5 mg via RESPIRATORY_TRACT
  Administered 2013-08-09: 2.5 mg via RESPIRATORY_TRACT
  Administered 2013-08-10 – 2013-08-11 (×6): 5 mg via RESPIRATORY_TRACT
  Filled 2013-08-07 (×14): qty 6

## 2013-08-07 MED ORDER — PANTOPRAZOLE SODIUM 40 MG PO TBEC
40.0000 mg | DELAYED_RELEASE_TABLET | Freq: Every day | ORAL | Status: DC
  Administered 2013-08-07 – 2013-08-11 (×5): 40 mg via ORAL
  Filled 2013-08-07 (×5): qty 1

## 2013-08-07 MED ORDER — LORAZEPAM 1 MG PO TABS
1.0000 mg | ORAL_TABLET | Freq: Every day | ORAL | Status: DC
  Administered 2013-08-07 – 2013-08-10 (×4): 1 mg via ORAL
  Filled 2013-08-07 (×4): qty 1

## 2013-08-07 MED ORDER — IPRATROPIUM BROMIDE 0.02 % IN SOLN
0.5000 mg | Freq: Once | RESPIRATORY_TRACT | Status: AC
  Administered 2013-08-07: 0.5 mg via RESPIRATORY_TRACT
  Filled 2013-08-07: qty 2.5

## 2013-08-07 MED ORDER — IPRATROPIUM-ALBUTEROL 0.5-2.5 (3) MG/3ML IN SOLN: 3.0000 mL | Freq: Four times a day (QID) | RESPIRATORY_TRACT | Status: DC

## 2013-08-07 MED ORDER — DEXTROSE 5 % IV SOLN
500.0000 mg | INTRAVENOUS | Status: DC
  Filled 2013-08-07: qty 500

## 2013-08-07 MED ORDER — NICOTINE 21 MG/24HR TD PT24
21.0000 mg | MEDICATED_PATCH | Freq: Every day | TRANSDERMAL | Status: DC
  Administered 2013-08-07 – 2013-08-11 (×5): 21 mg via TRANSDERMAL
  Filled 2013-08-07 (×5): qty 1

## 2013-08-07 MED ORDER — IPRATROPIUM BROMIDE 0.02 % IN SOLN: 0.5000 mg | RESPIRATORY_TRACT | Status: DC | PRN

## 2013-08-07 MED ORDER — FOLIC ACID 1 MG PO TABS
1.0000 mg | ORAL_TABLET | Freq: Two times a day (BID) | ORAL | Status: DC
  Administered 2013-08-07 – 2013-08-11 (×8): 1 mg via ORAL
  Filled 2013-08-07 (×9): qty 1

## 2013-08-07 MED ORDER — FAMOTIDINE 10 MG PO TABS
10.0000 mg | ORAL_TABLET | Freq: Every day | ORAL | Status: DC
  Administered 2013-08-07 – 2013-08-11 (×5): 10 mg via ORAL
  Filled 2013-08-07 (×5): qty 1

## 2013-08-07 MED ORDER — PAROXETINE HCL 30 MG PO TABS
30.0000 mg | ORAL_TABLET | Freq: Every day | ORAL | Status: DC
  Administered 2013-08-07 – 2013-08-11 (×5): 30 mg via ORAL
  Filled 2013-08-07 (×5): qty 1

## 2013-08-07 MED ORDER — IOHEXOL 300 MG/ML  SOLN
80.0000 mL | Freq: Once | INTRAMUSCULAR | Status: AC | PRN
  Administered 2013-08-07: 80 mL via INTRAVENOUS

## 2013-08-07 MED ORDER — CEFTRIAXONE SODIUM 1 G IJ SOLR
1.0000 g | INTRAMUSCULAR | Status: DC
  Filled 2013-08-07: qty 10

## 2013-08-07 MED ORDER — OXYBUTYNIN CHLORIDE ER 10 MG PO TB24
10.0000 mg | ORAL_TABLET | Freq: Every day | ORAL | Status: DC
  Administered 2013-08-07 – 2013-08-11 (×5): 10 mg via ORAL
  Filled 2013-08-07 (×5): qty 1

## 2013-08-07 MED ORDER — LISINOPRIL 10 MG PO TABS
10.0000 mg | ORAL_TABLET | Freq: Every day | ORAL | Status: DC
  Administered 2013-08-07 – 2013-08-10 (×4): 10 mg via ORAL
  Filled 2013-08-07 (×4): qty 1

## 2013-08-07 MED ORDER — METHYLPREDNISOLONE SODIUM SUCC 125 MG IJ SOLR
60.0000 mg | Freq: Two times a day (BID) | INTRAMUSCULAR | Status: DC
  Administered 2013-08-07 – 2013-08-09 (×4): 60 mg via INTRAVENOUS
  Filled 2013-08-07 (×6): qty 0.96

## 2013-08-07 MED ORDER — ALBUTEROL SULFATE (2.5 MG/3ML) 0.083% IN NEBU
2.5000 mg | INHALATION_SOLUTION | RESPIRATORY_TRACT | Status: DC
  Administered 2013-08-07: 2.5 mg via RESPIRATORY_TRACT
  Filled 2013-08-07: qty 3

## 2013-08-07 MED ORDER — ONDANSETRON HCL 4 MG PO TABS: 4.0000 mg | ORAL_TABLET | Freq: Four times a day (QID) | ORAL | Status: DC | PRN

## 2013-08-07 MED ORDER — AZATHIOPRINE 50 MG PO TABS
50.0000 mg | ORAL_TABLET | Freq: Two times a day (BID) | ORAL | Status: DC
  Administered 2013-08-07 – 2013-08-11 (×8): 50 mg via ORAL
  Filled 2013-08-07 (×9): qty 1

## 2013-08-07 MED ORDER — POTASSIUM CHLORIDE ER 10 MEQ PO TBCR
10.0000 meq | EXTENDED_RELEASE_TABLET | Freq: Every day | ORAL | Status: DC
  Administered 2013-08-07 – 2013-08-11 (×5): 10 meq via ORAL
  Filled 2013-08-07 (×5): qty 1

## 2013-08-07 MED ORDER — NIACIN 500 MG PO TABS
500.0000 mg | ORAL_TABLET | Freq: Every day | ORAL | Status: DC
  Administered 2013-08-07 – 2013-08-11 (×5): 500 mg via ORAL
  Filled 2013-08-07 (×5): qty 1

## 2013-08-07 MED ORDER — HYDROCOD POLST-CHLORPHEN POLST 10-8 MG/5ML PO LQCR
5.0000 mL | Freq: Two times a day (BID) | ORAL | Status: DC | PRN
  Administered 2013-08-07 – 2013-08-10 (×4): 5 mL via ORAL
  Filled 2013-08-07 (×4): qty 5

## 2013-08-07 MED ORDER — NICOTINE 21 MG/24HR TD PT24: 21.0000 mg | MEDICATED_PATCH | Freq: Every day | TRANSDERMAL | Status: DC

## 2013-08-07 MED ORDER — ATORVASTATIN CALCIUM 20 MG PO TABS
20.0000 mg | ORAL_TABLET | Freq: Every day | ORAL | Status: DC
  Administered 2013-08-08 – 2013-08-10 (×3): 20 mg via ORAL
  Filled 2013-08-07 (×4): qty 1

## 2013-08-07 MED ORDER — HYDROCODONE-ACETAMINOPHEN 5-325 MG PO TABS: 1.0000 | ORAL_TABLET | ORAL | Status: DC | PRN

## 2013-08-07 MED ORDER — ONDANSETRON HCL 4 MG/2ML IJ SOLN: 4.0000 mg | Freq: Four times a day (QID) | INTRAMUSCULAR | Status: DC | PRN

## 2013-08-07 MED ORDER — GUAIFENESIN ER 600 MG PO TB12
600.0000 mg | ORAL_TABLET | Freq: Two times a day (BID) | ORAL | Status: DC
  Administered 2013-08-07 – 2013-08-11 (×8): 600 mg via ORAL
  Filled 2013-08-07 (×9): qty 1

## 2013-08-07 MED ORDER — ALBUTEROL SULFATE (2.5 MG/3ML) 0.083% IN NEBU: 2.5000 mg | INHALATION_SOLUTION | RESPIRATORY_TRACT | Status: DC | PRN

## 2013-08-07 MED ORDER — ALBUTEROL SULFATE (2.5 MG/3ML) 0.083% IN NEBU
2.5000 mg | INHALATION_SOLUTION | RESPIRATORY_TRACT | Status: DC | PRN
  Administered 2013-08-08: 2.5 mg via RESPIRATORY_TRACT
  Filled 2013-08-07 (×4): qty 3

## 2013-08-07 MED ORDER — ACETAMINOPHEN 325 MG PO TABS
650.0000 mg | ORAL_TABLET | Freq: Four times a day (QID) | ORAL | Status: DC | PRN
Start: 2013-08-07 — End: 2013-08-11

## 2013-08-07 MED ORDER — METFORMIN HCL ER 500 MG PO TB24
500.0000 mg | ORAL_TABLET | Freq: Two times a day (BID) | ORAL | Status: DC
  Filled 2013-08-07: qty 1

## 2013-08-07 MED ORDER — CALCIUM CITRATE-VITAMIN D 315-200 MG-UNIT PO TABS: 1.0000 | ORAL_TABLET | Freq: Every day | ORAL | Status: DC

## 2013-08-07 MED ORDER — CALCIUM CARBONATE-VITAMIN D 500-200 MG-UNIT PO TABS
1.0000 | ORAL_TABLET | Freq: Every day | ORAL | Status: DC
  Administered 2013-08-08 – 2013-08-11 (×4): 1 via ORAL
  Filled 2013-08-07 (×5): qty 1

## 2013-08-07 MED ORDER — ALBUTEROL SULFATE (2.5 MG/3ML) 0.083% IN NEBU
5.0000 mg | INHALATION_SOLUTION | Freq: Once | RESPIRATORY_TRACT | Status: AC
  Administered 2013-08-07: 5 mg via RESPIRATORY_TRACT
  Filled 2013-08-07: qty 6

## 2013-08-07 NOTE — Progress Notes (Addendum)
Jessica Rollins [id number] Admission Data: 08/07/2013 7:01 PM Attending Provider: Robbie Lis, MD  HFW:[id number], Leafy Kindle., MD Consults/ Treatment Team:    Jessica Rollins is a 58 y.o. female patient admitted from ED awake, alert  & orientated  X 3,  Full Code, VSS - Blood pressure 108/61, pulse 81, temperature 98.1 F (36.7 C), temperature source Oral, resp. rate 16, height 5\' 3"  (1.6 m), weight 62.506 kg (137 lb 12.8 oz), SpO2 93.00%., O2    3 L nasal cannular, no c/o shortness of breath, no c/o chest pain, no distress noted. Tele # 20 placed and pt is currently running:normal sinus rhythm.   IV site WDL:  forearml right, condition patent and no redness with a transparent dsg that's clean dry and intact.  Allergies:  No Known Allergies   Past Medical History  Diagnosis Date  . COPD (chronic obstructive pulmonary disease)   . Pulmonary fibrosis     diagnosed with this 2001.    . Diabetes mellitus without complication   . Hiatal hernia   . Depression   . Anxiety   . High cholesterol   . Hypertension   . Osteoporosis   . OSA on CPAP     13cm  . Overactive bladder   . Heart murmur     History:  obtained from the patient. Tobacco/alcohol: Smoked 1 packs per day for 46 years none  Pt orientation to unit, room and routine. Information packet given to patient/family and safety video watched.  Admission INP armband ID verified with patient/family, and in place. SR up x 2, fall risk assessment complete with Patient and family verbalizing understanding of risks associated with falls. Pt verbalizes an understanding of how to use the call bell and to call for help before getting out of bed.  Skin, clean-dry- intact without evidence of bruising, or skin tears.   No evidence of skin break down noted on exam.     Will cont to monitor and assist as needed.  Park Breed, RN 08/07/2013 7:01 PM

## 2013-08-07 NOTE — ED Notes (Signed)
Pt reports pulmonary fibrosis, wears o2 at all times. Reports waking up this am and o2 was 72%. Reports symptoms have become more severe past 4 days, having fatigue, chills, headache. Also having urinary frequency, has been diagnosed with UTI. Went to [facility] today and sent here for further eval and probable admission. spo2 91%, bp 90/66.

## 2013-08-07 NOTE — H&P (Signed)
[facility] History and Physical  Jessica Rollins FTD:[id number] DOB: 09-Jul-1956 DOA: 08/07/2013  Referring physician: ER physician PCP: Alena Bills., MD   Chief Complaint: shortness of breath  HPI:  58 year old female with past medical history of COPD on home oxygen, pulmonary fibrosis, hypertension, DM, dyslipidemia who presented to [facility] ED 08/07/2013 with worsening shortness of breath, productive cough, associated fever and chills ongoing for past week or so but getting progressively worse over past few days prior to this admission. Pt reported having upper respiratory tract infection for some time and thought she was improving but never really got better.She did not report chest pain, palpitations. No abdominal pain, nausea or vomiting. No reports of blood in stool or urine. No lightheadedness or dizziness. No LOC.  In ED, BP was 90/56 and it has improved to 115/59 with IV fluids. HR was 74, Tmax was 65F and oxygen saturation of 91% on 3 L [place]. Blood work revealed WBC count of 12.7. CXR revealed the findings of COPD and pulmonary fibrosis with superimposed left lower lobe pneumonia. There was also mild soft tissue fullness in the right peritracheal region. Pt was started on azithromycin and rocephin in ED.  Assessment and Plan:  Principal Problem:   Acute respiratory failure with hypoxia - likely due to combination of COPD, pulmonary fibrosis and pneumonia - oxygen support via  to keep O2 saturation above  90% - albuterol and atrovent every 4 hours scheduled and as needed for shortness of breath or wheezing  - solumedrol 60 mg Q 12 hours IV - azithromycin and rocephin for CAP - influenza by PCR negative - follow up blood culture results, resp culture, legionella and strep pneumo Active Problems:   Pulmonary fibrosis - management with oxygen, steroids, and BD   COPD (chronic obstructive pulmonary disease) - management with BD, steroids and oxygen support - COPD gold alert  ordered and COPD order set  In place   CAP (community acquired pneumonia) - pneumonia order set in place - azithromycin and rocephin for CAP - influenza by PCR negative - follow up blood culture results, resp culture, legionella and strep pneumo   Diabetes mellitus without complication - continue metformin   OSA on CPAP   Hypertension - continue lisinopril   Anxiety and depression - continue paxil   Overactive bladder - continue ditropan   Radiological Exams on Admission: Dg Chest 2 View 08/07/2013  IMPRESSION: The findings are consistent with COPD and pulmonary fibrosis with superimposed left lower lobe pneumonia. There is no overt evidence of CHF. There is mild soft tissue fullness in the right peritracheal region. When the patient can tolerate the procedure, a follow-up chest CT scan is recommended to further evaluate the pulmonary parenchyma and mediastinum.   Electronically Signed   By: David  Martinique   On: 08/07/2013 15:55    Code Status: Full Family Communication: Pt at bedside Disposition Plan: Admit for further evaluation  Leisa Lenz, MD  [facility] Pager [phone]  Review of Systems:  Constitutional: positive for fever, chills and malaise/fatigue. Negative for diaphoresis.  HENT: Negative for hearing loss, ear pain, nosebleeds, congestion, sore throat, neck pain, tinnitus and ear discharge.   Eyes: Negative for blurred vision, double vision, photophobia, pain, discharge and redness.  Respiratory: positive for cough, sputum production, shortness of breath, wheezing.   Cardiovascular: Negative for chest pain, palpitations, orthopnea, claudication and leg swelling.  Gastrointestinal: Negative for nausea, vomiting and abdominal pain. Negative for heartburn, constipation, blood in stool and melena.  Genitourinary: Negative for dysuria, urgency, frequency, hematuria and flank pain.  Musculoskeletal: Negative for myalgias, back pain, joint pain and falls.  Skin:  Negative for itching and rash.  Neurological: Negative for dizziness and weakness. Negative for tingling, tremors, sensory change, speech change, focal weakness, loss of consciousness and headaches.  Endo/Heme/Allergies: Negative for environmental allergies and polydipsia. Does not bruise/bleed easily.  Psychiatric/Behavioral: Negative for suicidal ideas. The patient is not nervous/anxious.      Past Medical History  Diagnosis Date  . COPD (chronic obstructive pulmonary disease)   . Pulmonary fibrosis     diagnosed with this 2001.    . Diabetes mellitus without complication   . Hiatal hernia   . Depression   . Anxiety   . High cholesterol   . Hypertension   . Osteoporosis   . OSA on CPAP     13cm  . Overactive bladder   . Heart murmur    Past Surgical History  Procedure Laterality Date  . Bladder suspension    . Abdominal hysterectomy      severe bleeding-when she was in her 30's  . Lung biopsy     Social History:  reports that she has been smoking Cigarettes.  She started smoking about 50 years ago. She has been smoking about 1.50 packs per day. She does not have any smokeless tobacco history on file. She reports that she does not drink alcohol or use illicit drugs.  No Known Allergies  Family History  Problem Relation Age of Onset  . Pulmonary fibrosis Brother    Prior to Admission medications   Medication Sig Start Date End Date Taking? Authorizing Provider  albuterol (PROVENTIL HFA;VENTOLIN HFA) 108 (90 BASE) MCG/ACT inhaler Inhale 2 puffs into the lungs every 6 (six) hours as needed for wheezing.   Yes Historical Provider, MD  azaTHIOprine (IMURAN) 50 MG tablet Take 50 mg by mouth 2 (two) times daily.   Yes Historical Provider, MD  calcium citrate-vitamin D (CITRACAL+D) 315-200 MG-UNIT per tablet Take 1 tablet by mouth daily.   Yes Historical Provider, MD  Cimetidine (TAGAMET PO) Take 1 tablet by mouth daily.   Yes Historical Provider, MD  esomeprazole (NEXIUM) 40 MG  capsule Take 40 mg by mouth daily before breakfast.   Yes Historical Provider, MD  Fluticasone-Salmeterol (ADVAIR) 250-50 MCG/DOSE AEPB Inhale 1 puff into the lungs every 12 (twelve) hours.   Yes Historical Provider, MD  folic acid (FOLVITE) 1 MG tablet Take 1 mg by mouth 2 (two) times daily.   Yes Historical Provider, MD  guaiFENesin (MUCINEX) 600 MG 12 hr tablet Take 600 mg by mouth 2 (two) times daily.   Yes Historical Provider, MD  IRON PO Take 1 tablet by mouth 4 (four) times daily.   Yes Historical Provider, MD  lisinopril (PRINIVIL,ZESTRIL) 10 MG tablet Take 10 mg by mouth daily.   Yes Historical Provider, MD  LORazepam (ATIVAN) 1 MG tablet Take 1 mg by mouth at bedtime.   Yes Historical Provider, MD  metFORMIN (GLUMETZA) 500 MG (MOD) 24 hr tablet Take 500 mg by mouth 2 (two) times daily.   Yes Historical Provider, MD  niacin 500 MG tablet Take 500 mg by mouth daily.   Yes Historical Provider, MD  Omega-3 Fatty Acids (FISH OIL) 300 MG CAPS Take 1 capsule by mouth daily.   Yes Historical Provider, MD  oxybutynin (DITROPAN-XL) 10 MG 24 hr tablet Take 10 mg by mouth daily.   Yes Historical Provider, MD  PARoxetine (PAXIL)  30 MG tablet Take 30 mg by mouth daily.   Yes Historical Provider, MD  potassium chloride (K-DUR) 10 MEQ tablet Take 10 mEq by mouth daily.   Yes Historical Provider, MD  rosuvastatin (CRESTOR) 10 MG tablet Take 10 mg by mouth daily.   Yes Historical Provider, MD  tiotropium (SPIRIVA) 18 MCG inhalation capsule Place 18 mcg into inhaler and inhale every evening.   Yes Historical Provider, MD   Physical Exam: Filed Vitals:   08/07/13 1421 08/07/13 1430 08/07/13 1600 08/07/13 1700  BP:  101/50 111/54 115/59  Pulse:   74 84  Temp:      TempSrc:      Resp:   19 15  Height:      Weight:      SpO2: 94%  92% 93%    Physical Exam  Constitutional: Appears ill, no acute distress HENT: Normocephalic. External right and left ear normal. Dry mucus membranes Eyes: Conjunctivae  and EOM are normal. PERRLA, no scleral icterus.  Neck: Normal ROM. Neck supple. No JVD. No tracheal deviation.  CVS: RRR, S1/S2 +, no murmurs, no gallops, no carotid bruit.  Pulmonary: wheezing appreciated in upper lung lobes Abdominal: Soft. BS +,  no distension, tenderness, rebound or guarding.  Musculoskeletal: Normal range of motion. No edema and no tenderness.  Lymphadenopathy: No lymphadenopathy noted, cervical, inguinal. Neuro: Alert. No focal neurologic deficits Skin: Skin is warm and dry.   Psychiatric: Normal mood and affect.   Labs on Admission:  Basic Metabolic Panel:  Recent Labs Lab 08/07/13 1309  NA 139  K 3.7  CL 99  CO2 25  GLUCOSE 280*  BUN 15  CREATININE 0.40*  CALCIUM 9.0   Liver Function Tests: No results found for this basename: AST, ALT, ALKPHOS, BILITOT, PROT, ALBUMIN,  in the last 168 hours No results found for this basename: LIPASE, AMYLASE,  in the last 168 hours No results found for this basename: AMMONIA,  in the last 168 hours CBC:  Recent Labs Lab 08/07/13 1309  WBC 12.7*  HGB 12.4  HCT 37.6  MCV 87.6  PLT 389   Cardiac Enzymes: No results found for this basename: CKTOTAL, CKMB, CKMBINDEX, TROPONINI,  in the last 168 hours BNP: No components found with this basename: POCBNP,  CBG: No results found for this basename: GLUCAP,  in the last 168 hours  If 7PM-7AM, please contact night-coverage [email or web address] Password [facility] 08/07/2013, 5:12 PM

## 2013-08-07 NOTE — Progress Notes (Signed)
I spoke with Dr. Hilbert Bible at 2215 concerning Metformin order. Pt just received Iv contrast in CT and is unable to get Metformin for the next 48 hours from 08/07/13 at 2145. I was unable to modify the order to say that so Dr. Hilbert Bible said she will take care of it.

## 2013-08-07 NOTE — ED Provider Notes (Signed)
CSN: [id number]     Arrival date & time 08/07/13  1257 History   None    Chief Complaint  Patient presents with  . Shortness of Breath  . Urinary Tract Infection   (Consider location/radiation/quality/duration/timing/severity/associated sxs/prior Treatment) HPI Comments: Patient presents to the emergency department with chief complaint of shortness of breath. She states that she has a history of pulmonary fibrosis, and COPD. She states that she has been fighting a URI for the past several days. She states that she has been having a productive cough with associated fatigue, chills, and sinus headache. She is also complaining of urinary frequency, and was diagnosed with UTI by her PCP. She states that this morning when she woke, her oxygen was 72%. She normally wears 2 L while at home. She's had increased this to 3 L today to maintain sufficient oxygenation. She was referred to the emergency department by her primary care provider, who recommended admission. She has tried using OTC cough and cold medicine as well as an   The history is provided by the patient. No language interpreter was used.    Past Medical History  Diagnosis Date  . COPD (chronic obstructive pulmonary disease)   . Pulmonary fibrosis     diagnosed with this 2001.    . Diabetes mellitus without complication   . Hiatal hernia   . Depression   . Anxiety   . High cholesterol   . Hypertension   . Osteoporosis   . OSA on CPAP     13cm  . Overactive bladder   . Heart murmur    Past Surgical History  Procedure Laterality Date  . Bladder suspension    . Abdominal hysterectomy      severe bleeding-when she was in her 30's  . Lung biopsy     Family History  Problem Relation Age of Onset  . Pulmonary fibrosis Brother    History  Substance Use Topics  . Smoking status: Current Every Day Smoker -- 1.50 packs/day    Types: Cigarettes    Start date: 07/11/1963  . Smokeless tobacco: Not on file  . Alcohol Use: No    OB History   Grav Para Term Preterm Abortions TAB SAB Ect Mult Living                 Review of Systems  All other systems reviewed and are negative.    Allergies  Review of patient's allergies indicates no known allergies.  Home Medications   Current Outpatient Rx  Name  Route  Sig  Dispense  Refill  . Aclidinium Bromide 400 MCG/ACT AEPB   Inhalation   Inhale 1 Inhaler into the lungs 2 (two) times daily.         Marland Kitchen albuterol (PROVENTIL HFA;VENTOLIN HFA) 108 (90 BASE) MCG/ACT inhaler   Inhalation   Inhale 2 puffs into the lungs every 6 (six) hours as needed for wheezing.         Marland Kitchen azaTHIOprine (IMURAN) 50 MG tablet   Oral   Take 50 mg by mouth 2 (two) times daily.         . calcium citrate-vitamin D (CITRACAL+D) 315-200 MG-UNIT per tablet   Oral   Take 1 tablet by mouth daily.         . Cimetidine (TAGAMET PO)   Oral   Take 1 tablet by mouth daily.         Marland Kitchen doxycycline (VIBRA-TABS) 100 MG tablet   Oral   Take  100 mg by mouth 2 (two) times daily. Takes for 10 days.  First dose 12/16/2012.         Marland Kitchen esomeprazole (NEXIUM) 40 MG capsule   Oral   Take 40 mg by mouth daily before breakfast.         . Fluticasone-Salmeterol (ADVAIR) 250-50 MCG/DOSE AEPB   Inhalation   Inhale 1 puff into the lungs every 12 (twelve) hours.         . folic acid (FOLVITE) 1 MG tablet   Oral   Take 1 mg by mouth 2 (two) times daily.         Marland Kitchen guaiFENesin (MUCINEX) 600 MG 12 hr tablet   Oral   Take 600 mg by mouth 2 (two) times daily.         . IRON PO   Oral   Take 1 tablet by mouth 4 (four) times daily.         Marland Kitchen LORazepam (ATIVAN) 1 MG tablet   Oral   Take 1 mg by mouth at bedtime.         Marland Kitchen losartan (COZAAR) 50 MG tablet   Oral   Take 1 tablet (50 mg total) by mouth daily.   20 tablet   0   . METFORMIN HCL ER PO   Oral   Take 1 tablet by mouth every evening.         . niacin 500 MG tablet   Oral   Take 500 mg by mouth daily.         .  nicotine (NICODERM CQ - DOSED IN MG/24 HOURS) 21 mg/24hr patch   Transdermal   Place 1 patch onto the skin daily.   28 patch   0   . Omega-3 Fatty Acids (FISH OIL) 300 MG CAPS   Oral   Take 1 capsule by mouth daily.         Marland Kitchen oxybutynin (DITROPAN-XL) 10 MG 24 hr tablet   Oral   Take 10 mg by mouth daily.         Marland Kitchen PARoxetine (PAXIL) 30 MG tablet   Oral   Take 30 mg by mouth daily.         . potassium chloride (K-DUR) 10 MEQ tablet   Oral   Take 10 mEq by mouth daily.         . predniSONE (DELTASONE) 5 MG tablet      Label  & dispense according to the schedule below. 10 Pills PO for 3 days then, 8 Pills PO for 3 days, 6 Pills PO for 3 days, 4 Pills PO for 3 days, then continue you are 10 mg daily dose as usual   100 tablet   0   . rosuvastatin (CRESTOR) 10 MG tablet   Oral   Take 10 mg by mouth daily.          BP 90/66  Pulse 95  Temp(Src) 98 F (36.7 C) (Oral)  Resp 22  Ht 5\' 3"  (1.6 m)  Wt 151 lb (68.493 kg)  BMI 26.76 kg/m2  SpO2 91% Physical Exam  Nursing note and vitals reviewed. Constitutional: She is oriented to person, place, and time. She appears well-developed and well-nourished.  HENT:  Head: Normocephalic and atraumatic.  Eyes: Conjunctivae and EOM are normal. Pupils are equal, round, and reactive to light.  Neck: Normal range of motion. Neck supple.  Cardiovascular: Normal rate and regular rhythm.  Exam reveals no gallop and no friction rub.  No murmur heard. Pulmonary/Chest: Effort normal. No respiratory distress. She has wheezes. She has no rales. She exhibits no tenderness.  Abdominal: Soft. She exhibits no distension and no mass. There is no tenderness. There is no rebound and no guarding.  Musculoskeletal: Normal range of motion. She exhibits no edema and no tenderness.  Neurological: She is alert and oriented to person, place, and time.  Skin: Skin is warm and dry.  Psychiatric: She has a normal mood and affect. Her behavior is  normal. Judgment and thought content normal.    ED Course  Procedures (including critical care time) Results for orders placed during the hospital encounter of 08/07/13  CBC      Result Value Range   WBC 12.7 (*) 4.0 - 10.5 K/uL   RBC 4.29  3.87 - 5.11 MIL/uL   Hemoglobin 12.4  12.0 - 15.0 g/dL   HCT 37.6  36.0 - 46.0 %   MCV 87.6  78.0 - 100.0 fL   MCH 28.9  26.0 - 34.0 pg   MCHC 33.0  30.0 - 36.0 g/dL   RDW 17.9 (*) 11.5 - 15.5 %   Platelets 389  150 - 400 K/uL  BASIC METABOLIC PANEL      Result Value Range   Sodium 139  137 - 147 mEq/L   Potassium 3.7  3.7 - 5.3 mEq/L   Chloride 99  96 - 112 mEq/L   CO2 25  19 - 32 mEq/L   Glucose, Bld 280 (*) 70 - 99 mg/dL   BUN 15  6 - 23 mg/dL   Creatinine, Ser 0.40 (*) 0.50 - 1.10 mg/dL   Calcium 9.0  8.4 - 10.5 mg/dL   GFR calc non Af Amer >90  >90 mL/min   GFR calc Af Amer >90  >90 mL/min  PRO B NATRIURETIC PEPTIDE      Result Value Range   Pro B Natriuretic peptide (BNP) 901.6 (*) 0 - 125 pg/mL  URINALYSIS, ROUTINE W REFLEX MICROSCOPIC      Result Value Range   Color, Urine AMBER (*) YELLOW   APPearance CLOUDY (*) CLEAR   Specific Gravity, Urine 1.042 (*) 1.005 - 1.030   pH 6.0  5.0 - 8.0   Glucose, UA >1000 (*) NEGATIVE mg/dL   Hgb urine dipstick MODERATE (*) NEGATIVE   Bilirubin Urine SMALL (*) NEGATIVE   Ketones, ur 15 (*) NEGATIVE mg/dL   Protein, ur 100 (*) NEGATIVE mg/dL   Urobilinogen, UA 1.0  0.0 - 1.0 mg/dL   Nitrite NEGATIVE  NEGATIVE   Leukocytes, UA SMALL (*) NEGATIVE  URINE MICROSCOPIC-ADD ON      Result Value Range   Squamous Epithelial / LPF RARE  RARE   WBC, UA 7-10  <3 WBC/hpf   RBC / HPF 3-6  <3 RBC/hpf   Bacteria, UA FEW (*) RARE   Urine-Other MUCOUS PRESENT    POCT I-STAT TROPONIN I      Result Value Range   Troponin i, poc 0.02  0.00 - 0.08 ng/mL   Comment 3            Dg Chest 2 View  08/07/2013   CLINICAL DATA:  Cough and congestion and chest pain. History of COPD and pulmonary fibrosis and  tobacco use  EXAM: CHEST  2 VIEW  COMPARISON:  DG CHEST 2 VIEW dated 12/20/2012  FINDINGS: The lungs are hyperinflated. Diffusely increased interstitial markings are present and not greatly changed. There is increased density at the left  lung base posteriorly consistent with pneumonia. The cardiac silhouette is not enlarged. The pulmonary vascularity is prominent centrally but stable. There is mild soft tissue fullness in the right paratracheal region which is not clearly new. The observed portions of the bony thorax exhibit no acute abnormalities.  IMPRESSION: The findings are consistent with COPD and pulmonary fibrosis with superimposed left lower lobe pneumonia. There is no overt evidence of CHF. There is mild soft tissue fullness in the right peritracheal region. When the patient can tolerate the procedure, a follow-up chest CT scan is recommended to further evaluate the pulmonary parenchyma and mediastinum.   Electronically Signed   By: David  Martinique   On: 08/07/2013 15:55      EKG Interpretation   None       MDM   1. CAP (community acquired pneumonia)     Patient with pneumonia.  Increased oxygen requirement.  Will admit to medicine.  Discussed with Dr. Stark Jock, who agrees with the plan.  Patient also diagnosed with UTI by PCP and given a shot of rocephin 1g.  No recent hospitalizations.  Will obtain blood cultures and start abx.  Patient maintaining O2 saturation while on 3L [place].    Montine Circle, PA-C 08/07/13 [phone]

## 2013-08-08 DIAGNOSIS — J8410 Pulmonary fibrosis, unspecified: Secondary | ICD-10-CM

## 2013-08-08 DIAGNOSIS — R918 Other nonspecific abnormal finding of lung field: Secondary | ICD-10-CM | POA: Diagnosis present

## 2013-08-08 DIAGNOSIS — G4733 Obstructive sleep apnea (adult) (pediatric): Secondary | ICD-10-CM

## 2013-08-08 LAB — URINE CULTURE
COLONY COUNT: NO GROWTH
CULTURE: NO GROWTH

## 2013-08-08 LAB — GLUCOSE, CAPILLARY
Glucose-Capillary: 274 mg/dL — ABNORMAL HIGH (ref 70–99)
Glucose-Capillary: 269 mg/dL — ABNORMAL HIGH (ref 70–99)
Glucose-Capillary: 200 mg/dL — ABNORMAL HIGH (ref 70–99)

## 2013-08-08 LAB — COMPREHENSIVE METABOLIC PANEL
ALT: 8 U/L (ref 0–35)
AST: 9 U/L (ref 0–37)
Albumin: 2.5 g/dL — ABNORMAL LOW (ref 3.5–5.2)
Alkaline Phosphatase: 102 U/L (ref 39–117)
BUN: 15 mg/dL (ref 6–23)
CHLORIDE: 103 meq/L (ref 96–112)
CO2: 24 meq/L (ref 19–32)
Calcium: 9.2 mg/dL (ref 8.4–10.5)
Creatinine, Ser: 0.42 mg/dL — ABNORMAL LOW (ref 0.50–1.10)
GFR calc Af Amer: >90 mL/min (ref >90)
Glucose, Bld: 291 mg/dL — ABNORMAL HIGH (ref 70–99)
POTASSIUM: 4.7 meq/L (ref 3.7–5.3)
Sodium: 142 mEq/L (ref 137–147)
Total Protein: 6.6 g/dL (ref 6.0–8.3)

## 2013-08-08 LAB — LEGIONELLA ANTIGEN, URINE: Legionella Antigen, Urine: NEGATIVE

## 2013-08-08 LAB — CBC
HCT: 36.5 % (ref 36.0–46.0)
Hemoglobin: 11.7 g/dL — ABNORMAL LOW (ref 12.0–15.0)
MCH: 28.3 pg (ref 26.0–34.0)
MCHC: 32.1 g/dL (ref 30.0–36.0)
MCV: 88.4 fL (ref 78.0–100.0)
PLATELETS: 364 10*3/uL (ref 150–400)
RBC: 4.13 MIL/uL (ref 3.87–5.11)
RDW: 17.8 % — AB (ref 11.5–15.5)
WBC: 9.9 10*3/uL (ref 4.0–10.5)

## 2013-08-08 LAB — TSH: TSH: 2.114 u[IU]/mL (ref 0.350–4.500)

## 2013-08-08 MED ORDER — AZITHROMYCIN 500 MG PO TABS
500.0000 mg | ORAL_TABLET | Freq: Every day | ORAL | Status: DC
  Administered 2013-08-08 – 2013-08-10 (×3): 500 mg via ORAL
  Filled 2013-08-08 (×4): qty 1

## 2013-08-08 MED ORDER — VANCOMYCIN HCL 10 G IV SOLR
1250.0000 mg | INTRAVENOUS | Status: DC
  Filled 2013-08-08: qty 1250

## 2013-08-08 MED ORDER — DEXTROSE 5 % IV SOLN
1.0000 g | INTRAVENOUS | Status: DC
  Administered 2013-08-08 – 2013-08-11 (×4): 1 g via INTRAVENOUS
  Filled 2013-08-08 (×4): qty 10

## 2013-08-08 MED ORDER — HYDRALAZINE HCL 20 MG/ML IJ SOLN: 10.0000 mg | Freq: Four times a day (QID) | INTRAMUSCULAR | Status: DC | PRN

## 2013-08-08 MED ORDER — HEPARIN SODIUM (PORCINE) 5000 UNIT/ML IJ SOLN
5000.0000 [IU] | Freq: Three times a day (TID) | INTRAMUSCULAR | Status: DC
  Administered 2013-08-08 – 2013-08-11 (×9): 5000 [IU] via SUBCUTANEOUS
  Filled 2013-08-08 (×12): qty 1

## 2013-08-08 MED ORDER — INSULIN GLARGINE 100 UNIT/ML ~~LOC~~ SOLN
5.0000 [IU] | Freq: Every day | SUBCUTANEOUS | Status: DC
  Administered 2013-08-08 – 2013-08-11 (×4): 5 [IU] via SUBCUTANEOUS
  Filled 2013-08-08 (×4): qty 0.05

## 2013-08-08 MED ORDER — DEXTROSE 5 % IV SOLN
1.0000 g | Freq: Two times a day (BID) | INTRAVENOUS | Status: DC
  Filled 2013-08-08 (×2): qty 1

## 2013-08-08 MED ORDER — DEXTROSE 5 % IV SOLN
500.0000 mg | INTRAVENOUS | Status: DC
  Filled 2013-08-08: qty 500

## 2013-08-08 MED ORDER — INSULIN ASPART 100 UNIT/ML ~~LOC~~ SOLN
0.0000 [IU] | Freq: Three times a day (TID) | SUBCUTANEOUS | Status: DC
  Administered 2013-08-08 (×2): 8 [IU] via SUBCUTANEOUS
  Administered 2013-08-09 (×2): 3 [IU] via SUBCUTANEOUS
  Administered 2013-08-09 – 2013-08-10 (×2): 5 [IU] via SUBCUTANEOUS
  Administered 2013-08-10: 11 [IU] via SUBCUTANEOUS
  Administered 2013-08-11: 8 [IU] via SUBCUTANEOUS
  Administered 2013-08-11: 3 [IU] via SUBCUTANEOUS

## 2013-08-08 MED ORDER — ADULT MULTIVITAMIN W/MINERALS CH
1.0000 | ORAL_TABLET | Freq: Every day | ORAL | Status: DC
  Administered 2013-08-08 – 2013-08-11 (×4): 1 via ORAL
  Filled 2013-08-08 (×5): qty 1

## 2013-08-08 NOTE — Progress Notes (Signed)
Inpatient Diabetes Program Recommendations  AACE/ADA: New Consensus Statement on Inpatient Glycemic Control (2013)  Target Ranges:  Prepandial:   less than 140 mg/dL      Peak postprandial:   less than 180 mg/dL (1-2 hours)      Critically ill patients:  140 - 180 mg/dL     Results for Jessica Rollins, Jessica Rollins (MRN [id number]) as of 08/08/2013 09:01  Ref. Range 08/07/2013 13:09 08/07/2013 19:36 08/08/2013 05:50  Glucose Latest Range: 70-99 mg/dL 280 (H) 186 (H) 291 (H)     Diabetes history: Type 2 diabetes.  Outpatient Diabetes medications: Metformin 500 mg bid  Current orders for Inpatient glycemic control: No insulin currently ordered.   **Patient currently receiving IV steroids (Solumedrol 60 mg Q12 hours).  Only getting CBGs checked QAM at present.   **MD- Please consider the following:  1. Initiate Novolog Sensitive SSI tid ac + HS  2. Change diet to Carbohydrate Modified diet (currently on Regular diet with no carbohydrate restrictions)    Will follow. Wyn Quaker RN, MSN, CDE Diabetes Coordinator Inpatient Diabetes Program Team Pager: [phone] (8a-10p)

## 2013-08-08 NOTE — Progress Notes (Signed)
[facility] PROGRESS NOTE  Javen Ridings EZM:[id number] DOB: 08-18-1955 DOA: 08/07/2013 PCP: Alena Bills., MD   58 year old female with past medical history of COPD on home oxygen, pulmonary fibrosis, hypertension, DM, dyslipidemia who presented to [facility] ED 08/07/2013 with worsening shortness of breath, productive cough, associated fever and chills ongoing for past week.   In ED, BP was 90/56 and it has improved to 115/59 with IV fluids. HR was 74, Tmax was 70F and oxygen saturation of 91% on 3 L [place]. Blood work revealed WBC count of 12.7. CXR revealed the findings of COPD and pulmonary fibrosis with superimposed left lower lobe pneumonia. . Pt was started on azithromycin and rocephin in ED.    Assessment/Plan:  Acute on chronic hypoxic respiratory failure secondary to CAP O2 sats dropped into the 70s on room air. Bilateral pna noted on CT Patient started on Azith and Rocephin at admission. Blood cultures pending  Spiculated lung nodules Noted on CT.  Concerning for malignancy Pulmonary consulted for biopsy.  COPD Exacerbation in the setting of pulmonary fibrosis Steroids, antibotics, oxygen, flutter valve. On Imuran for Pulmonary Fibrosis.  OSA  CPAP at qhs.  DM Metformin held SSI - Moderate and carb modified.  Hypertension  continue lisinopril  Will add prn hydralazine   Anxiety and depression  continue paxil   Overactive bladder  continue ditropan   DVT Prophylaxis:  heparin  Code Status: full Family Communication: patient is alert and orientated. Disposition Plan: inpatient   Consultants:  pulmonology  Procedures:    Antibiotics:  Rocephin, Azith.  HPI/Subjective: Patient having paroxysmal hacking cough.  Unable to speak.  Objective: Filed Vitals:   08/08/13 0900 08/08/13 0916 08/08/13 1259 08/08/13 1316  BP: 137/66 138/66  161/75  Pulse: 72 74  73  Temp:  98.1 F (36.7 C)  98 F (36.7 C)  TempSrc:  Oral  Oral  Resp:    20   Height:      Weight:      SpO2:  96% 96% 93%    Intake/Output Summary (Last 24 hours) at 08/08/13 1359 Last data filed at 08/08/13 1245  Gross per 24 hour  Intake   1280 ml  Output   1700 ml  Net   -420 ml   Filed Weights   08/07/13 1305 08/07/13 1842 08/08/13 0500  Weight: 68.493 kg (151 lb) 62.506 kg (137 lb 12.8 oz) 62.6 kg (138 lb 0.1 oz)    Exam: General: Well developed, pale, appears stated age  78:  PERR, EOMI, Anicteic Sclera, MMM. No pharyngeal erythema or exudates  Neck: Supple, no JVD, no masses  Cardiovascular: RRR, S1 S2 auscultated, no rubs, murmurs or gallops.   Respiratory: bilateral rales, increased work of breathing, non-productive hacking cough. Abdomen: obese, Soft, nontender, nondistended, + bowel sounds  Extremities: warm dry without cyanosis clubbing or edema.  Neuro: AAOx3, cranial nerves grossly intact. Strength 5/5 in upper and lower extremities  Skin: flushed in the face, Without rashes exudates or nodules.   Psych: Normal affect and demeanor with intact judgement and insight   Data Reviewed: Basic Metabolic Panel:  Recent Labs Lab 08/07/13 1309 08/07/13 1936 08/08/13 0550  NA 139 142 142  K 3.7 3.7 4.7  CL 99 100 103  CO2 25 25 24   GLUCOSE 280* 186* 291*  BUN 15 15 15   CREATININE 0.40* 0.45* 0.42*  CALCIUM 9.0 8.9 9.2  MG  --  2.0  --   PHOS  --  4.0  --  Liver Function Tests:  Recent Labs Lab 08/07/13 1936 08/08/13 0550  AST 9 9  ALT 7 8  ALKPHOS 101 102  BILITOT <0.2* <0.2*  PROT 6.8 6.6  ALBUMIN 2.6* 2.5*   CBC:  Recent Labs Lab 08/07/13 1309 08/07/13 1936 08/08/13 0550  WBC 12.7* 11.8* 9.9  NEUTROABS  --  9.2*  --   HGB 12.4 12.1 11.7*  HCT 37.6 37.2 36.5  MCV 87.6 87.7 88.4  PLT 389 378 364     Studies: Dg Chest 2 View  08/07/2013   CLINICAL DATA:  Cough and congestion and chest pain. History of COPD and pulmonary fibrosis and tobacco use  EXAM: CHEST  2 VIEW  COMPARISON:  DG CHEST 2 VIEW dated  12/20/2012  FINDINGS: The lungs are hyperinflated. Diffusely increased interstitial markings are present and not greatly changed. There is increased density at the left lung base posteriorly consistent with pneumonia. The cardiac silhouette is not enlarged. The pulmonary vascularity is prominent centrally but stable. There is mild soft tissue fullness in the right paratracheal region which is not clearly new. The observed portions of the bony thorax exhibit no acute abnormalities.  IMPRESSION: The findings are consistent with COPD and pulmonary fibrosis with superimposed left lower lobe pneumonia. There is no overt evidence of CHF. There is mild soft tissue fullness in the right peritracheal region. When the patient can tolerate the procedure, a follow-up chest CT scan is recommended to further evaluate the pulmonary parenchyma and mediastinum.   Electronically Signed   By: David  Martinique   On: 08/07/2013 15:55   Ct Chest W Contrast  08/07/2013   CLINICAL DATA:  Shortness of breath, productive cough  EXAM: CT CHEST WITH CONTRAST  TECHNIQUE: Multidetector CT imaging of the chest was performed during intravenous contrast administration.  CONTRAST:  34mL OMNIPAQUE IOHEXOL 300 MG/ML  SOLN  COMPARISON:  08/07/2013 radiograph  FINDINGS: Scattered atherosclerosis of the aorta and branch vessels. Normal caliber. Pulmonary arterial vessels are patent centrally. The bolus timing is not optimized to evaluate the more peripheral vessels. Heart size within normal limits. Coronary artery calcification.  Mediastinal and right greater than left hilar lymphadenopathy. As index, a right paratracheal conglomerate measuring 2.9 x 3.9 cm on image 19 of series 2 and a right hilar node measuring 2.5 cm on image 31. 1.0 cm short axis node medial to the left common carotid artery on image 12. Left hilar nodes measure sub cm short axis however indeterminate.  Upper abdominal images show a nodular liver contour which may reflect underlying  cirrhosis. Small hiatal hernia.  There is a 1.9 x 2.2 cm nodule within the right upper lobe anteriorly on image 28 series 3 with spiculated margins. Just superior to this, there is a 0.7 cm satellite nodule on image 25. Background emphysema and peripheral reticular opacities. Patchy right upper lobe airspace opacities are nonspecific. Bilateral lower lobe consolidations. Clustered nodular opacities within the right lower lobe measuring up to 1.2 cm on image 38 of series 3. Left upper lobe paramediastinal 0.9 cm nodule on image 24. Ground-glass opacity left upper lobe nodule subpleural measuring 0.7 cm on image 18.  The central airways are patent the some of the lower lobe bronchioles appear impacted. No pneumothorax.  No acute osseous finding.  IMPRESSION: 2.2 cm right upper lobe nodule is suspicious for a primary lung cancer. There is a 0.7 cm satellite nodule. Marked mediastinal and right hilar lymphadenopathy. Left hilar lymph nodes are indeterminate.  Bilateral lower lobe airspace  opacities may reflect pneumonia or aspiration. Background emphysema and peripheral reticular opacities.  Indeterminate ground-glass opacity nodule within the left lung measuring 0.7 cm and nodular opacities within left upper lobe paramediastinal and right lower lobe, which may be infectious or malignant.  Cirrhotic liver morphology.   Electronically Signed   By: Carlos Levering M.D.   On: 08/07/2013 22:06    Scheduled Meds: . albuterol  5 mg Nebulization QID  . atorvastatin  20 mg Oral q1800  . azaTHIOprine  50 mg Oral BID  . azithromycin  500 mg Oral q1800  . calcium-vitamin D  1 tablet Oral Q breakfast  . cefTRIAXone (ROCEPHIN)  IV  1 g Intravenous Q24H  . famotidine  10 mg Oral Daily  . folic acid  1 mg Oral BID  . guaiFENesin  600 mg Oral BID  . heparin subcutaneous  5,000 Units Subcutaneous Q8H  . insulin aspart  0-15 Units Subcutaneous TID WC  . lisinopril  10 mg Oral Daily  . LORazepam  1 mg Oral QHS  .  methylPREDNISolone (SOLU-MEDROL) injection  60 mg Intravenous Q12H  . multivitamin with minerals  1 tablet Oral Daily  . niacin  500 mg Oral Daily  . nicotine  21 mg Transdermal Daily  . omega-3 acid ethyl esters  1 g Oral Daily  . oxybutynin  10 mg Oral Daily  . pantoprazole  40 mg Oral Daily  . PARoxetine  30 mg Oral Daily  . potassium chloride  10 mEq Oral Daily   Continuous Infusions:   Principal Problem:   Acute respiratory failure with hypoxia Active Problems:   Pulmonary fibrosis   COPD (chronic obstructive pulmonary disease)   Diabetes mellitus without complication   OSA on CPAP   Hypertension   Anxiety   Overactive bladder   CAP (community acquired pneumonia)   Pulmonary nodules    Karen Kitchens  [facility] Pager [phone]. If 7PM-7AM, please contact night-coverage at [email or web address], password [facility] 08/08/2013, 1:59 PM  LOS: 1 day   Attending - Patient seen and examined, agree with the above assessment and plan. 58 year old chronic tobacco use, COPD on home O2 and on chronic prednisone and azathioprine therapy presented with worsening cough and shortness of breath. Found to have bilateral lower lobe pneumonia and a right upper lung mass highly suspicious for primary bronchogenic carcinoma. Currently slightly better than on initial presentation, continue with IV antibiotics, steroids, scheduled nebulized bronchodilators. Appreciate PCCM evaluation, plans for outpatient bronchoscopy and biopsy.  S Particia Strahm

## 2013-08-08 NOTE — Consult Note (Signed)
Name: Jessica Rollins MRN: [id number] DOB: 06-28-1956    ADMISSION DATE:  08/07/2013 CONSULTATION DATE:  08/08/2013  REFERRING MD :  [facility] PRIMARY SERVICE:  PCCM  CHIEF COMPLAINT:  Dyspnea  BRIEF PATIENT DESCRIPTION: 58 year old female with history of COPD and IPF, admitted 1/29 to [facility] with CAP. Chest CT discovered multiple lung nodules suspicious for malignancy.   SIGNIFICANT EVENTS / STUDIES:  1/29 - Chest CT > Multiple nodules in Rt Lung, Primary nodule 2.2 cm in RUL. Few nodular opacities in LUL as well.    LINES / TUBES: PIV  CULTURES: 1/29 Blood >>> 1/29 Urine >>>  ANTIBIOTICS: 1/29 Azithromycin >>> 1/29 Ceftriaxone >>>  HISTORY OF PRESENT ILLNESS:  58 year old female with a PMH as described below, which includes COPD and IPF. These are managed by Dr. Koleen Nimrod in [place]. She has a long hx of COPD on chronic home oxygen at 2L/min, still smokes 1ppd, and has superimposed diffuse pulm fibrosis diagnosed > 76yrs ago w/ FOB and then VATS lung biopsy at [facility] & treated for many yrs on Imuran. For the last month and a half she has been having some upper respiratory symptoms which were treated with levaquin and tamiflu. About a week ago when she developed worsening SOB, productive cough, fever, and chills. 1/29 She was mildly hypotensive in the ED which improved with IVF, and had an SpO2 of 91% on 3L. She also presented with mild leukocytosis and CXR revealed a LLL pneumonia. CXR impression also recommended a CT to further evaluate the pulmonary parenchyma. CT revealed several pulmonary nodules. PCCM was asked to see.      PAST MEDICAL HISTORY :  Past Medical History  Diagnosis Date  . COPD (chronic obstructive pulmonary disease)   . Pulmonary fibrosis     diagnosed with this 2001.    . Diabetes mellitus without complication   . Hiatal hernia   . Depression   . Anxiety   . High cholesterol   . Hypertension   . Osteoporosis   . OSA on CPAP     13cm  . Overactive bladder   .  Heart murmur    Past Surgical History  Procedure Laterality Date  . Bladder suspension    . Abdominal hysterectomy      severe bleeding-when she was in her 30's  . Lung biopsy     Prior to Admission medications   Medication Sig Start Date End Date Taking? Authorizing Provider  albuterol (PROVENTIL HFA;VENTOLIN HFA) 108 (90 BASE) MCG/ACT inhaler Inhale 2 puffs into the lungs every 6 (six) hours as needed for wheezing.   Yes Historical Provider, MD  azaTHIOprine (IMURAN) 50 MG tablet Take 50 mg by mouth 2 (two) times daily.   Yes Historical Provider, MD  calcium citrate-vitamin D (CITRACAL+D) 315-200 MG-UNIT per tablet Take 1 tablet by mouth daily.   Yes Historical Provider, MD  Cimetidine (TAGAMET PO) Take 1 tablet by mouth daily.   Yes Historical Provider, MD  esomeprazole (NEXIUM) 40 MG capsule Take 40 mg by mouth daily before breakfast.   Yes Historical Provider, MD  Fluticasone-Salmeterol (ADVAIR) 250-50 MCG/DOSE AEPB Inhale 1 puff into the lungs every 12 (twelve) hours.   Yes Historical Provider, MD  folic acid (FOLVITE) 1 MG tablet Take 1 mg by mouth 2 (two) times daily.   Yes Historical Provider, MD  guaiFENesin (MUCINEX) 600 MG 12 hr tablet Take 600 mg by mouth 2 (two) times daily.   Yes Historical Provider, MD  IRON  PO Take 1 tablet by mouth 4 (four) times daily.   Yes Historical Provider, MD  lisinopril (PRINIVIL,ZESTRIL) 10 MG tablet Take 10 mg by mouth daily.   Yes Historical Provider, MD  LORazepam (ATIVAN) 1 MG tablet Take 1 mg by mouth at bedtime.   Yes Historical Provider, MD  metFORMIN (GLUMETZA) 500 MG (MOD) 24 hr tablet Take 500 mg by mouth 2 (two) times daily.   Yes Historical Provider, MD  niacin 500 MG tablet Take 500 mg by mouth daily.   Yes Historical Provider, MD  Omega-3 Fatty Acids (FISH OIL) 300 MG CAPS Take 1 capsule by mouth daily.   Yes Historical Provider, MD  oxybutynin (DITROPAN-XL) 10 MG 24 hr tablet Take 10 mg by mouth daily.   Yes Historical Provider, MD   PARoxetine (PAXIL) 30 MG tablet Take 30 mg by mouth daily.   Yes Historical Provider, MD  potassium chloride (K-DUR) 10 MEQ tablet Take 10 mEq by mouth daily.   Yes Historical Provider, MD  rosuvastatin (CRESTOR) 10 MG tablet Take 10 mg by mouth daily.   Yes Historical Provider, MD  tiotropium (SPIRIVA) 18 MCG inhalation capsule Place 18 mcg into inhaler and inhale every evening.   Yes Historical Provider, MD   No Known Allergies  FAMILY HISTORY:  Family History  Problem Relation Age of Onset  . Pulmonary fibrosis Brother    SOCIAL HISTORY:  reports that she has been smoking Cigarettes.  She started smoking about 50 years ago. She has been smoking about 1.50 packs per day. She does not have any smokeless tobacco history on file. She reports that she does not drink alcohol or use illicit drugs.  REVIEW OF SYSTEMS:   Bolds are positive  Constitutional: weight loss, gain, night sweats, Fevers, chills, fatigue .  HEENT: headaches, Sore throat, sneezing, nasal congestion, post nasal drip, Difficulty swallowing, Tooth/dental problems, visual complaints visual changes, ear ache CV:  chest pain, radiates: ,Orthopnea, PND, swelling in lower extremities, dizziness, palpitations, syncope.  GI  heartburn, indigestion, abdominal pain, nausea, vomiting, diarrhea, change in bowel habits, loss of appetite, bloody stools.  Resp: cough, productive with yellow/green sputum , hemoptysis, dyspnea, chest pain, pleuritic worse with cough and deep breathing.  Skin: rash or itching or icterus GU: dysuria, change in color of urine, urgency or frequency. flank pain, hematuria  MS: joint pain or swelling. decreased range of motion  Psych: change in mood or affect. depression or anxiety.  Neuro: difficulty with speech, weakness, numbness, ataxia   INTERVAL HISTORY:  VITAL SIGNS: Temp:  [97.4 F (36.3 C)-99.4 F (37.4 C)] 97.4 F (36.3 C) (01/30 0426) Pulse Rate:  [72-95] 72 (01/30 0900) Resp:  [15-22] 18  (01/30 0426) BP: (90-137)/(50-67) 137/66 mmHg (01/30 0900) SpO2:  [91 %-96 %] 96 % (01/30 0916) Weight:  [62.506 kg (137 lb 12.8 oz)-68.493 kg (151 lb)] 62.6 kg (138 lb 0.1 oz) (01/30 0500)  PHYSICAL EXAMINATION: General:  Female of normal body habitus in no acute distress on 3L/min O2 via [place] Neuro:  Awake, oriented.  HEENT:  NCAT, PERRL. No JVD noted. Cardiovascular:  RRR Lungs:  Rhonchi and expiratory wheezing throughout.  Abdomen:  Soft, non-tender, non-distended Musculoskeletal:  Intact, clubbing of fingers.  Skin:  Intact, No rash.   Recent Labs Lab 08/07/13 1309 08/07/13 1936 08/08/13 0550  NA 139 142 142  K 3.7 3.7 4.7  CL 99 100 103  CO2 25 25 24   BUN 15 15 15   CREATININE 0.40* 0.45* 0.42*  GLUCOSE  280* 186* 291*    Recent Labs Lab 08/07/13 1309 08/07/13 1936 08/08/13 0550  HGB 12.4 12.1 11.7*  HCT 37.6 37.2 36.5  WBC 12.7* 11.8* 9.9  PLT 389 378 364   Dg Chest 2 View  08/07/2013   CLINICAL DATA:  Cough and congestion and chest pain. History of COPD and pulmonary fibrosis and tobacco use  EXAM: CHEST  2 VIEW  COMPARISON:  DG CHEST 2 VIEW dated 12/20/2012  FINDINGS: The lungs are hyperinflated. Diffusely increased interstitial markings are present and not greatly changed. There is increased density at the left lung base posteriorly consistent with pneumonia. The cardiac silhouette is not enlarged. The pulmonary vascularity is prominent centrally but stable. There is mild soft tissue fullness in the right paratracheal region which is not clearly new. The observed portions of the bony thorax exhibit no acute abnormalities.  IMPRESSION: The findings are consistent with COPD and pulmonary fibrosis with superimposed left lower lobe pneumonia. There is no overt evidence of CHF. There is mild soft tissue fullness in the right peritracheal region. When the patient can tolerate the procedure, a follow-up chest CT scan is recommended to further evaluate the pulmonary parenchyma  and mediastinum.   Electronically Signed   By: David  Martinique   On: 08/07/2013 15:55   Ct Chest W Contrast  08/07/2013   CLINICAL DATA:  Shortness of breath, productive cough  EXAM: CT CHEST WITH CONTRAST  TECHNIQUE: Multidetector CT imaging of the chest was performed during intravenous contrast administration.  CONTRAST:  27mL OMNIPAQUE IOHEXOL 300 MG/ML  SOLN  COMPARISON:  08/07/2013 radiograph  FINDINGS: Scattered atherosclerosis of the aorta and branch vessels. Normal caliber. Pulmonary arterial vessels are patent centrally. The bolus timing is not optimized to evaluate the more peripheral vessels. Heart size within normal limits. Coronary artery calcification.  Mediastinal and right greater than left hilar lymphadenopathy. As index, a right paratracheal conglomerate measuring 2.9 x 3.9 cm on image 19 of series 2 and a right hilar node measuring 2.5 cm on image 31. 1.0 cm short axis node medial to the left common carotid artery on image 12. Left hilar nodes measure sub cm short axis however indeterminate.  Upper abdominal images show a nodular liver contour which may reflect underlying cirrhosis. Small hiatal hernia.  There is a 1.9 x 2.2 cm nodule within the right upper lobe anteriorly on image 28 series 3 with spiculated margins. Just superior to this, there is a 0.7 cm satellite nodule on image 25. Background emphysema and peripheral reticular opacities. Patchy right upper lobe airspace opacities are nonspecific. Bilateral lower lobe consolidations. Clustered nodular opacities within the right lower lobe measuring up to 1.2 cm on image 38 of series 3. Left upper lobe paramediastinal 0.9 cm nodule on image 24. Ground-glass opacity left upper lobe nodule subpleural measuring 0.7 cm on image 18.  The central airways are patent the some of the lower lobe bronchioles appear impacted. No pneumothorax.  No acute osseous finding.  IMPRESSION: 2.2 cm right upper lobe nodule is suspicious for a primary lung cancer.  There is a 0.7 cm satellite nodule. Marked mediastinal and right hilar lymphadenopathy. Left hilar lymph nodes are indeterminate.  Bilateral lower lobe airspace opacities may reflect pneumonia or aspiration. Background emphysema and peripheral reticular opacities.  Indeterminate ground-glass opacity nodule within the left lung measuring 0.7 cm and nodular opacities within left upper lobe paramediastinal and right lower lobe, which may be infectious or malignant.  Cirrhotic liver morphology.   Electronically Signed  By: Carlos Levering M.D.   On: 08/07/2013 22:06    ASSESSMENT / PLAN:  Acute hypoxic respiratory failure secondary to CAP Hx COPD, IPF New onset Scattered Bilateral Lung Nodules with mediastinal and paratracheal adenopathy- smoking history and character of the nodules raise suspicion for primary lung CA, but she is immunosuppressed and is at risk for opportunistic infxn.  At this point I recommend:  - treating for presumed CAP and AE-COPD - work to obtain notes from Dr Koleen Nimrod in [place] - obtain old CT scans from [facility] - will convert the existing can here into a superD disk in case we decide to pursue FOB + ENB - will obtain PFT after her acute illness to determine risk for general anesthesia - Office visit with Dr Lamonte Sakai 2/3 at 14:00. If she isn't out of the hosp by then I will reschedule.   Baltazar Apo, MD, PhD 08/08/2013, 12:46 PM [facility] [phone] or if no answer [phone]

## 2013-08-08 NOTE — Progress Notes (Signed)
PT Cancellation Note  Patient Details Name: Lurae Hornbrook MRN: [id number] DOB: Jul 09, 1956   Cancelled Treatment:    Reason Eval/Treat Not Completed: PT screened, no needs identified, will sign off   Marshae Azam 08/08/2013, 3:00 PM

## 2013-08-08 NOTE — Evaluation (Signed)
Occupational Therapy Evaluation Patient Details Name: Jessica Rollins MRN: [id number] DOB: 02-19-1956 Today's Date: 08/08/2013 Time: 9983-3825 OT Time Calculation (min): 23 min  OT Assessment / Plan / Recommendation History of present illness 58 year old female with past medical history of COPD on home oxygen, pulmonary fibrosis, hypertension, DM, dyslipidemia who presented to [facility] ED 08/07/2013 with worsening shortness of breath, productive cough, associated fever and chills ongoing for past week or so but getting progressively worse over past few days prior to this admission. Pt reported having upper respiratory tract infection for some time and thought she was improving but never really got better.She did not report chest pain, palpitations. No abdominal pain, nausea or vomiting. No reports of blood in stool or urine. No lightheadedness or dizziness. No LOC.  In ED, BP was 90/56 and it has improved to 115/59 with IV fluids. HR was 74, Tmax was 57F and oxygen saturation of 91% on 3 L [place]. Blood work revealed WBC count of 12.7. CXR revealed the findings of COPD and pulmonary fibrosis with superimposed left lower lobe pneumonia. There was also mild soft tissue fullness in the right peritracheal region. Pt was started on azithromycin and rocephin in ED.    Clinical Impression   Pt independent with ADLs and ADL mobility. No further acute OT services indicated at this time. OT will sign off    OT Assessment  Patient does not need any further OT services    Follow Up Recommendations  No OT follow up    Barriers to Discharge  none    Equipment Recommendations  None recommended by OT    Recommendations for Other Services    Frequency       Precautions / Restrictions Restrictions Weight Bearing Restrictions: No   Pertinent Vitals/Pain No c/o pain    ADL  Grooming: Performed;Independent Where Assessed - Grooming: Unsupported standing Upper Body Bathing: Simulated;Independent Where Assessed -  Upper Body Bathing: Unsupported standing Lower Body Bathing: Simulated Where Assessed - Lower Body Bathing: Unsupported standing Upper Body Dressing: Performed;Independent Lower Body Dressing: Performed;Independent Toilet Transfer: Performed;Independent Toilet Transfer Method: Sit to Loss adjuster, chartered: Regular height toilet Toileting - Clothing Manipulation and Hygiene: Performed;Independent Where Assessed - Toileting Clothing Manipulation and Hygiene: Standing Tub/Shower Transfer: Performed;Independent;Simulated Tub/Shower Transfer Method: Therapist, art: Walk in shower Transfers/Ambulation Related to ADLs: good and safe technique    OT Diagnosis:    OT Problem List:   OT Treatment Interventions:     OT Goals(Current goals can be found in the care plan section) Acute Rehab OT Goals Patient Stated Goal: " I want to go home "  Visit Information  Last OT Received On: 08/01/13 History of Present Illness: 58 year old female with past medical history of COPD on home oxygen, pulmonary fibrosis, hypertension, DM, dyslipidemia who presented to [facility] ED 08/07/2013 with worsening shortness of breath, productive cough, associated fever and chills ongoing for past week or so but getting progressively worse over past few days prior to this admission. Pt reported having upper respiratory tract infection for some time and thought she was improving but never really got better.She did not report chest pain, palpitations. No abdominal pain, nausea or vomiting. No reports of blood in stool or urine. No lightheadedness or dizziness. No LOC.  In ED, BP was 90/56 and it has improved to 115/59 with IV fluids. HR was 74, Tmax was 57F and oxygen saturation of 91% on 3 L [place]. Blood work revealed WBC count of 12.7.  CXR revealed the findings of COPD and pulmonary fibrosis with superimposed left lower lobe pneumonia. There was also mild soft tissue fullness in the right peritracheal  region. Pt was started on azithromycin and rocephin in ED.        Prior [place] expects to be discharged to:: Private residence Living Arrangements: Alone Available Help at Discharge: Family Type of Home: House Home Access: Stairs to enter [employer] of Steps: 3 steps in back (always enters this way) Entrance Stairs-Rails: Right;Left;Can reach both Home Layout: One level Home Equipment: None Prior Function Level of Independence: Independent Communication Communication: No difficulties Dominant Hand: Right         Vision/Perception Vision - History Baseline Vision: Wears glasses only for reading Patient Visual Report: No change from baseline Perception Perception: Within Functional Limits   Cognition  Cognition Arousal/Alertness: Awake/alert Behavior During Therapy: WFL for tasks assessed/performed Overall Cognitive Status: Within Functional Limits for tasks assessed    Extremity/Trunk Assessment Upper Extremity Assessment Upper Extremity Assessment: Overall WFL for tasks assessed Lower Extremity Assessment Lower Extremity Assessment: Defer to PT evaluation Cervical / Trunk Assessment Cervical / Trunk Assessment: Normal     Mobility Bed Mobility Overal bed mobility: Independent Transfers Overall transfer level: Independent          Balance Balance Overall balance assessment: Independent   End of Session OT - End of Session Activity Tolerance: Patient tolerated treatment well Patient left: in bed;Other (comment);with nursing/sitter in room (sitting EOB)  GO     Britt Bottom 08/08/2013, 1:20 PM

## 2013-08-08 NOTE — Care Management Note (Signed)
    Page 1 of 1   08/11/2013     11:34:50 AM   CARE MANAGEMENT NOTE 08/11/2013  Patient:  Jessica Rollins, Jessica Rollins   Account Number:  [id number]  Date Initiated:  08/08/2013  Documentation initiated by:  Tomi Bamberger  Subjective/Objective Assessment:   dx acute resp failure  admit- lives alone.  Has home oxygen.     Action/Plan:   pt/ot eval-   Anticipated DC Date:  08/11/2013   Anticipated DC Plan:  LONG TERM ACUTE CARE (LTAC)      DC Planning Services  CM consult      Choice offered to / List presented to:  C-1 Patient           Status of service:  Completed, signed off Medicare Important Message given?   (If response is "NO", the following Medicare IM given date fields will be blank) Date Medicare IM given:   Date Additional Medicare IM given:    Discharge Disposition:  LONG TERM ACUTE CARE (LTAC)  Per UR Regulation:  Reviewed for med. necessity/level of care/duration of stay  If discussed at [place] of Stay Meetings, dates discussed:    Comments:  08/11/13 10:00 Tomi Bamberger RN, BSN [phone] NCM asked ltac to screen patient to see is she would be a candidate , patient on iv abx, conts to be hypoxic requiring 6-7 lites oxgen when ambulating, ant 4-5 liters at rest. Per Sonia Baller with ltac patient is a candidate for ltac, they have beds and MD states patient is ready for dc today to ltac.  Informed Sonia Baller d/c summary is done.  08/08/13 11:50 Tomi Bamberger RN, BSN [phone] patient lives alone, await pt/ot eval.  NCM made referral for COPD program to [facility].  Per Mr. Dara Lords, patient does not meet COPD Gold criteria, NCM informed Apolonio Schneiders RN to give patient the COPD packet here on the unit.

## 2013-08-08 NOTE — Progress Notes (Signed)
INITIAL NUTRITION ASSESSMENT  DOCUMENTATION CODES Per approved criteria  -Not Applicable   INTERVENTION: Add MVI daily. Encouraged adequate intake of meals; if oral intake declines, pt agreeable to Ensure/Glucerna Shakes if needed. RD to continue to follow nutrition care plan.  NUTRITION DIAGNOSIS: Increased nutrient needs related to COPD as evidenced by estimated needs.   Goal: Intake to meet >90% of estimated nutrition needs.  Monitor:  weight trends, lab trends, I/O's, PO intake, supplement tolerance  Reason for Assessment: MD Consult for Nutrition Assessment (COPD GOLD Protocol)  58 y.o. female  Admitting Dx: Acute respiratory failure with hypoxia  ASSESSMENT: PMHx significant for COPD on home oxygen, pulmonary fibrosis, HTN, DM, dyslipidemia. Admitted with worsening SOB, productive cough, associated fever and chills x 1 week. Work-up reveals acute respiratory failure 2/2 COPD, pulmonary fibrosis and PNA. Influenza negative.  RD consulted via COPD GOLD protocol. Discussed pt during progression rounds.  CXR revealed findings of COPD and pulmonary fibrosis with superimposed LLL pneumonia. There was also mild soft tissue fullness in the right peritracheal region.   Inpatient diabetes coordinator saw pt - pt with elevated CBGs of 280, 186, 291. Pt receiving IV steroids (Solumedrol); RN recommending changing diet to Carbohydrate Modified Medium diet and initiation of Novolog Sensitive SSI.  Per chart, pt consumed 100% of food brought by son last evening. Pt ate 100% of her breakfast this morning as well. She reports that her intake remains excellent even when having difficulty breathing. Pt with thin extremities, but no fat/muscle mass loss detected. Pt states that her steroids help with her intake. Discussed need for adequate nutrition with COPD and discussed that she is at risk for weight loss given high energy expenditure.   Height: Ht Readings from Last 1 Encounters:   08/07/13 5\' 3"  (1.6 m)    Weight: Wt Readings from Last 1 Encounters:  08/08/13 138 lb 0.1 oz (62.6 kg)    Ideal Body Weight: 115 lb  % Ideal Body Weight: 120%  Wt Readings from Last 10 Encounters:  08/08/13 138 lb 0.1 oz (62.6 kg)  12/24/12 140 lb 11.2 oz (63.821 kg)    Usual Body Weight: 140 lb  % Usual Body Weight: 99%  BMI:  Body mass index is 24.45 kg/(m^2). Normal weight  Estimated Nutritional Needs: Kcal: 1650 - 1850 Protein: 90 - 105 g Fluid: 1.8 - 2 liters  Skin: intact  Diet Order: General  EDUCATION NEEDS: -No education needs identified at this time   Intake/Output Summary (Last 24 hours) at 08/08/13 0953 Last data filed at 08/08/13 0736  Gross per 24 hour  Intake    860 ml  Output   1500 ml  Net   -640 ml    Last BM: 1/28  Labs:   Recent Labs Lab 08/07/13 1309 08/07/13 1936 08/08/13 0550  NA 139 142 142  K 3.7 3.7 4.7  CL 99 100 103  CO2 25 25 24   BUN 15 15 15   CREATININE 0.40* 0.45* 0.42*  CALCIUM 9.0 8.9 9.2  MG  --  2.0  --   PHOS  --  4.0  --   GLUCOSE 280* 186* 291*    CBG (last 3)  No results found for this basename: GLUCAP,  in the last 72 hours  Scheduled Meds: . albuterol  5 mg Nebulization QID  . atorvastatin  20 mg Oral q1800  . azaTHIOprine  50 mg Oral BID  . azithromycin  500 mg Intravenous Q24H  . calcium-vitamin D  1 tablet  Oral Q breakfast  . cefTRIAXone (ROCEPHIN)  IV  1 g Intravenous Q24H  . famotidine  10 mg Oral Daily  . folic acid  1 mg Oral BID  . guaiFENesin  600 mg Oral BID  . lisinopril  10 mg Oral Daily  . LORazepam  1 mg Oral QHS  . methylPREDNISolone (SOLU-MEDROL) injection  60 mg Intravenous Q12H  . niacin  500 mg Oral Daily  . nicotine  21 mg Transdermal Daily  . omega-3 acid ethyl esters  1 g Oral Daily  . oxybutynin  10 mg Oral Daily  . pantoprazole  40 mg Oral Daily  . PARoxetine  30 mg Oral Daily  . potassium chloride  10 mEq Oral Daily    Continuous Infusions:   Past Medical  History  Diagnosis Date  . COPD (chronic obstructive pulmonary disease)   . Pulmonary fibrosis     diagnosed with this 2001.    . Diabetes mellitus without complication   . Hiatal hernia   . Depression   . Anxiety   . High cholesterol   . Hypertension   . Osteoporosis   . OSA on CPAP     13cm  . Overactive bladder   . Heart murmur     Past Surgical History  Procedure Laterality Date  . Bladder suspension    . Abdominal hysterectomy      severe bleeding-when she was in her 30's  . Lung biopsy     Inda Coke MS, RD, LDN Pager: [phone] After-hours pager: [phone]

## 2013-08-08 NOTE — ED Provider Notes (Signed)
Medical screening examination/treatment/procedure(s) were performed by non-physician practitioner and as supervising physician I was immediately available for consultation/collaboration.     Veryl Speak, MD 08/08/13 [phone]

## 2013-08-08 NOTE — Progress Notes (Signed)
Utilization review completed. Jerilynn Feldmeier, RN, BSN. 

## 2013-08-09 DIAGNOSIS — E119 Type 2 diabetes mellitus without complications: Secondary | ICD-10-CM

## 2013-08-09 DIAGNOSIS — J441 Chronic obstructive pulmonary disease with (acute) exacerbation: Secondary | ICD-10-CM

## 2013-08-09 LAB — CBC
HCT: 39.7 % (ref 36.0–46.0)
HEMOGLOBIN: 12.7 g/dL (ref 12.0–15.0)
MCH: 28.5 pg (ref 26.0–34.0)
MCHC: 32 g/dL (ref 30.0–36.0)
MCV: 89 fL (ref 78.0–100.0)
Platelets: 429 10*3/uL — ABNORMAL HIGH (ref 150–400)
RBC: 4.46 MIL/uL (ref 3.87–5.11)
RDW: 17.4 % — ABNORMAL HIGH (ref 11.5–15.5)
WBC: 15.2 10*3/uL — ABNORMAL HIGH (ref 4.0–10.5)

## 2013-08-09 LAB — URINE CULTURE
CULTURE: NO GROWTH
Colony Count: NO GROWTH

## 2013-08-09 LAB — BASIC METABOLIC PANEL
BUN: 16 mg/dL (ref 6–23)
CO2: 26 mEq/L (ref 19–32)
Calcium: 9.4 mg/dL (ref 8.4–10.5)
Chloride: 99 mEq/L (ref 96–112)
Creatinine, Ser: 0.38 mg/dL — ABNORMAL LOW (ref 0.50–1.10)
GFR calc Af Amer: >90 mL/min (ref >90)
GFR calc non Af Amer: >90 mL/min (ref >90)
GLUCOSE: 205 mg/dL — AB (ref 70–99)
POTASSIUM: 4.5 meq/L (ref 3.7–5.3)
SODIUM: 140 meq/L (ref 137–147)

## 2013-08-09 LAB — GLUCOSE, CAPILLARY
Glucose-Capillary: 165 mg/dL — ABNORMAL HIGH (ref 70–99)
Glucose-Capillary: 194 mg/dL — ABNORMAL HIGH (ref 70–99)
Glucose-Capillary: 270 mg/dL — ABNORMAL HIGH (ref 70–99)
Glucose-Capillary: 204 mg/dL — ABNORMAL HIGH (ref 70–99)

## 2013-08-09 LAB — EXPECTORATED SPUTUM ASSESSMENT W REFEX TO RESP CULTURE

## 2013-08-09 LAB — EXPECTORATED SPUTUM ASSESSMENT W GRAM STAIN, RFLX TO RESP C

## 2013-08-09 MED ORDER — METHYLPREDNISOLONE SODIUM SUCC 40 MG IJ SOLR
40.0000 mg | Freq: Two times a day (BID) | INTRAMUSCULAR | Status: DC
  Administered 2013-08-09 – 2013-08-11 (×4): 40 mg via INTRAVENOUS
  Filled 2013-08-09 (×6): qty 1

## 2013-08-09 NOTE — Progress Notes (Signed)
RT asked pt about CPAP.  Pt requested RT come back later after 10pm.  RT will return to place CPAP on pt.

## 2013-08-09 NOTE — Progress Notes (Signed)
PATIENT DETAILS Name: Jessica Rollins Age: 58 y.o. Sex: female Date of Birth: February 15, 1956 Admit Date: 08/07/2013 Admitting Physician Robbie Lis, MD [id number], Leafy Kindle., MD  Subjective Claims that her shortness of breath is much better. No other complaints  Assessment/Plan: Principal Problem:   Acute on chronic respiratory failure with hypoxia - Secondary to pneumonia, underlying COPD and pulmonary fibrosis - Continue with nebulized bronchodilators, IV antibiotics, steroids.  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 - Metformin on hold  Dyslipidemia - Stable, continue with niacin  GERD - On PPI and Pepcid-stable  Anxiety with depression - Stable with lorazepam, Paxil  Overactive bladder  -continue ditropan  Obstructive sleep apnea - CPAP each bedtime  Disposition: Remain inpatient  DVT Prophylaxis: Prophylactic Heparin   Code Status: Full code   Family Communication None at bedside  Procedures:  None  CONSULTS:  pulmonary/intensive care  Time spent 40 minutes-which includes 50% of the time with face-to-face with patient/ family and coordinating care related to the above assessment and  plan.    MEDICATIONS: Scheduled Meds: . albuterol  5 mg Nebulization QID  . atorvastatin  20 mg Oral q1800  . azaTHIOprine  50 mg Oral BID  . azithromycin  500 mg Oral q1800  . calcium-vitamin D  1 tablet Oral Q breakfast  . cefTRIAXone (ROCEPHIN)  IV  1 g Intravenous Q24H  . famotidine  10 mg Oral Daily  . folic acid  1 mg Oral BID  . guaiFENesin  600 mg Oral BID  . heparin subcutaneous  5,000 Units Subcutaneous Q8H  . insulin aspart  0-15 Units Subcutaneous TID WC  . insulin glargine  5 Units Subcutaneous Daily  . lisinopril  10 mg Oral Daily  . LORazepam  1 mg Oral QHS  . methylPREDNISolone (SOLU-MEDROL) injection  60 mg Intravenous Q12H  . multivitamin with minerals  1 tablet Oral Daily  . niacin  500 mg Oral Daily  . nicotine  21 mg Transdermal Daily  . omega-3 acid ethyl esters  1 g Oral Daily  . oxybutynin  10 mg Oral Daily  . pantoprazole  40 mg Oral Daily  . PARoxetine  30 mg Oral Daily  . potassium chloride  10 mEq Oral Daily   Continuous Infusions:  PRN Meds:.acetaminophen, acetaminophen, albuterol, chlorpheniramine-HYDROcodone, hydrALAZINE, HYDROcodone-acetaminophen, ipratropium, ondansetron (ZOFRAN) IV, ondansetron  Antibiotics: Anti-infectives   Start     Dose/Rate Route Frequency Ordered Stop   08/08/13 1800  azithromycin (ZITHROMAX) tablet 500 mg     500 mg Oral Daily-1800 08/08/13 1348     08/08/13 1600  azithromycin (ZITHROMAX) 500 mg in dextrose 5 % 250 mL IVPB  Status:  Discontinued     500 mg 250 mL/hr over 60 Minutes Intravenous Every 24 hours 08/08/13 0927 08/08/13 1348   08/08/13  1000  ceFEPIme (MAXIPIME) 1 g in dextrose 5 % 50 mL IVPB  Status:  Discontinued     1 g 100 mL/hr over 30 Minutes Intravenous Every 12 hours 08/08/13 0856 08/08/13 0925   08/08/13 1000  cefTRIAXone (ROCEPHIN) 1 g in dextrose 5 % 50 mL IVPB     1 g 100 mL/hr over 30 Minutes Intravenous Every 24 hours 08/08/13 0927 08/15/13 0959   08/08/13 0945  vancomycin (VANCOCIN) 1,250  mg in sodium chloride 0.9 % 250 mL IVPB  Status:  Discontinued     1,250 mg 166.7 mL/hr over 90 Minutes Intravenous NOW 08/08/13 0904 08/08/13 0925   08/07/13 1645  cefTRIAXone (ROCEPHIN) 1 g in dextrose 5 % 50 mL IVPB  Status:  Discontinued     1 g 100 mL/hr over 30 Minutes Intravenous Every 24 hours 08/07/13 1638 08/08/13 0856   08/07/13 1645  azithromycin (ZITHROMAX) 500 mg in dextrose 5 % 250 mL IVPB  Status:  Discontinued     500 mg 250 mL/hr over 60 Minutes Intravenous Every 24 hours 08/07/13 1638 08/08/13 0856   08/07/13 1600  azithromycin (ZITHROMAX) 500 mg in dextrose 5 % 250 mL IVPB     500 mg 250 mL/hr over 60 Minutes Intravenous  Once 08/07/13 1559 08/07/13 1731       PHYSICAL EXAM: Vital signs in last 24 hours: Filed Vitals:   08/09/13 0500 08/09/13 0521 08/09/13 1212 08/09/13 1300  BP:  151/82  156/71  Pulse:  76  78  Temp:    98 F (36.7 C)  TempSrc:      Resp:  18    Height:      Weight: 63.05 kg (139 lb)     SpO2:  97% 97% 94%    Weight change: -5.443 kg (-12 lb) Filed Weights   08/07/13 1842 08/08/13 0500 08/09/13 0500  Weight: 62.506 kg (137 lb 12.8 oz) 62.6 kg (138 lb 0.1 oz) 63.05 kg (139 lb)   Body mass index is 24.63 kg/(m^2).   Gen Exam: Awake and alert with clear speech.   Neck: Supple, No JVD.   Chest: B/L Clear.  Few scattered rhonchi. CVS: S1 S2 Regular, no murmurs.  Abdomen: soft, BS +, non tender, non distended.  Extremities: no edema, lower extremities warm to touch Neurologic: Non Focal.   Skin: No Rash.   Wounds: N/A.    Intake/Output from previous day:  Intake/Output Summary (Last 24 hours) at 08/09/13 1444 Last data filed at 08/09/13 0551  Gross per 24 hour  Intake    942 ml  Output   1000 ml  Net    -58 ml     LAB RESULTS: CBC  Recent Labs Lab 08/07/13 1309 08/07/13 1936 08/08/13 0550 08/09/13 0500  WBC 12.7* 11.8* 9.9 15.2*  HGB 12.4 12.1 11.7* 12.7  HCT 37.6 37.2 36.5 39.7  PLT 389 378 364 429*  MCV 87.6  87.7 88.4 89.0  MCH 28.9 28.5 28.3 28.5  MCHC 33.0 32.5 32.1 32.0  RDW 17.9* 17.8* 17.8* 17.4*  LYMPHSABS  --  1.5  --   --   MONOABS  --  0.8  --   --   EOSABS  --  0.3  --   --   BASOSABS  --  0.0  --   --     Chemistries   Recent Labs Lab 08/07/13 1309 08/07/13 1936 08/08/13 0550 08/09/13 0500  NA 139 142 142 140  K 3.7 3.7 4.7 4.5  CL 99 100 103 99  CO2 25 25 24 26   GLUCOSE 280* 186* 291* 205*  BUN 15 15 15 16   CREATININE 0.40* 0.45* 0.42* 0.38*  CALCIUM 9.0 8.9 9.2 9.4  MG  --  2.0  --   --     CBG:  Recent Labs Lab 08/08/13 1242 08/08/13 1657 08/08/13 2203 08/09/13 0733 08/09/13 1156  GLUCAP 274* 269* 200* 165* 194*    GFR Estimated Creatinine Clearance: 68.6 ml/min (by C-G formula based on Cr of 0.38).  Coagulation profile  Recent Labs Lab 08/07/13 1936  INR 0.99    Cardiac Enzymes No results found for this basename: CK, CKMB, TROPONINI, MYOGLOBIN,  in the last 168 hours  No components found with this basename: POCBNP,  No results found for this basename: DDIMER,  in the last 72 hours No results found for this basename: HGBA1C,  in the last 72 hours No results found for this basename: CHOL, HDL, LDLCALC, TRIG, CHOLHDL, LDLDIRECT,  in the last 72 hours  Recent Labs  08/07/13 1936  TSH 2.114   No results found for this basename: VITAMINB12, FOLATE, FERRITIN, TIBC, IRON, RETICCTPCT,  in the last 72 hours No results found for this basename: LIPASE, AMYLASE,  in the last 72 hours  Urine Studies No results found for this basename: UACOL, UAPR, USPG, UPH, UTP, UGL, UKET, UBIL, UHGB, UNIT, UROB, ULEU, UEPI, UWBC, URBC, UBAC, CAST, CRYS, UCOM, BILUA,  in the last 72 hours  MICROBIOLOGY: Recent Results (from the past 240 hour(s))  URINE CULTURE     Status: None   Collection Time    08/07/13  3:05 PM      Result Value Range Status   Specimen Description URINE, CLEAN CATCH   Final   Special Requests NONE   Final   Culture  Setup Time     Final    Value: 08/07/2013 20:38     Performed at [facility]     Final   Value: NO GROWTH     Performed at [facility]   Culture     Final   Value: NO GROWTH     Performed at [facility]   Report Status 08/08/2013 FINAL   Final  CULTURE, BLOOD (ROUTINE X 2)     Status: None   Collection Time    08/07/13  4:05 PM      Result Value Range Status   Specimen Description BLOOD ARM LEFT   Final   Special Requests BOTTLES DRAWN AEROBIC AND ANAEROBIC 10CC   Final   Culture  Setup Time     Final   Value: 08/08/2013 00:10     Performed at [facility]   Culture     Final   Value:        BLOOD CULTURE RECEIVED NO GROWTH TO DATE CULTURE WILL BE HELD FOR 5 DAYS BEFORE ISSUING A FINAL NEGATIVE REPORT     Performed at [facility]   Report Status PENDING   Incomplete  CULTURE, BLOOD (ROUTINE X 2)     Status: None   Collection Time    08/07/13  4:15 PM      Result Value Range Status   Specimen Description BLOOD HAND LEFT   Final   Special Requests BOTTLES DRAWN AEROBIC ONLY 10CC   Final   Culture  Setup Time     Final   Value: 08/08/2013 00:09     Performed at  [facility]     Final   Value:        BLOOD CULTURE RECEIVED NO GROWTH TO DATE CULTURE WILL BE HELD FOR 5 DAYS BEFORE ISSUING A FINAL NEGATIVE REPORT     Performed at [facility]   Report Status PENDING   Incomplete  URINE CULTURE     Status: None   Collection Time    08/07/13  9:12 PM      Result Value Range Status   Specimen Description URINE, RANDOM   Final   Special Requests NONE   Final   Culture  Setup Time     Final   Value: 08/07/2013 22:43     Performed at [facility]     Final   Value: NO GROWTH     Performed at [facility]   Culture     Final   Value: NO GROWTH     Performed at [facility]   Report Status 08/09/2013 FINAL   Final  CULTURE, EXPECTORATED SPUTUM-ASSESSMENT     Status: None    Collection Time    08/09/13  5:25 AM      Result Value Range Status   Specimen Description SPUTUM   Final   Special Requests NONE   Final   Sputum evaluation     Final   Value: THIS SPECIMEN IS ACCEPTABLE. RESPIRATORY CULTURE REPORT TO FOLLOW.   Report Status 08/09/2013 FINAL   Final    RADIOLOGY STUDIES/RESULTS: Dg Chest 2 View  08/07/2013   CLINICAL DATA:  Cough and congestion and chest pain. History of COPD and pulmonary fibrosis and tobacco use  EXAM: CHEST  2 VIEW  COMPARISON:  DG CHEST 2 VIEW dated 12/20/2012  FINDINGS: The lungs are hyperinflated. Diffusely increased interstitial markings are present and not greatly changed. There is increased density at the left lung base posteriorly consistent with pneumonia. The cardiac silhouette is not enlarged. The pulmonary vascularity is prominent centrally but stable. There is mild soft tissue fullness in the right paratracheal region which is not clearly new. The observed portions of the bony thorax exhibit no acute abnormalities.  IMPRESSION: The findings are consistent with COPD and pulmonary fibrosis with superimposed left lower lobe pneumonia. There is no overt evidence of CHF. There is mild soft tissue fullness in the right peritracheal region. When the patient can tolerate the procedure, a follow-up chest CT scan is recommended to further evaluate the pulmonary parenchyma and mediastinum.   Electronically Signed   By: David  Martinique   On: 08/07/2013 15:55   Ct Chest W Contrast  08/07/2013   CLINICAL DATA:  Shortness of breath, productive cough  EXAM: CT CHEST WITH CONTRAST  TECHNIQUE: Multidetector CT imaging of the chest was performed during intravenous contrast administration.  CONTRAST:  51mL OMNIPAQUE IOHEXOL 300 MG/ML  SOLN  COMPARISON:  08/07/2013 radiograph  FINDINGS: Scattered atherosclerosis of the aorta and branch vessels. Normal caliber. Pulmonary arterial vessels are patent centrally. The bolus timing is not optimized to evaluate the  more peripheral vessels. Heart size within normal limits. Coronary artery calcification.  Mediastinal and right greater than left hilar lymphadenopathy. As index, a right paratracheal conglomerate measuring 2.9 x 3.9 cm on image 19 of series 2 and a right hilar node measuring 2.5 cm on image 31. 1.0 cm short axis node medial to the left common carotid artery on image 12. Left hilar nodes measure sub cm short axis  however indeterminate.  Upper abdominal images show a nodular liver contour which may reflect underlying cirrhosis. Small hiatal hernia.  There is a 1.9 x 2.2 cm nodule within the right upper lobe anteriorly on image 28 series 3 with spiculated margins. Just superior to this, there is a 0.7 cm satellite nodule on image 25. Background emphysema and peripheral reticular opacities. Patchy right upper lobe airspace opacities are nonspecific. Bilateral lower lobe consolidations. Clustered nodular opacities within the right lower lobe measuring up to 1.2 cm on image 38 of series 3. Left upper lobe paramediastinal 0.9 cm nodule on image 24. Ground-glass opacity left upper lobe nodule subpleural measuring 0.7 cm on image 18.  The central airways are patent the some of the lower lobe bronchioles appear impacted. No pneumothorax.  No acute osseous finding.  IMPRESSION: 2.2 cm right upper lobe nodule is suspicious for a primary lung cancer. There is a 0.7 cm satellite nodule. Marked mediastinal and right hilar lymphadenopathy. Left hilar lymph nodes are indeterminate.  Bilateral lower lobe airspace opacities may reflect pneumonia or aspiration. Background emphysema and peripheral reticular opacities.  Indeterminate ground-glass opacity nodule within the left lung measuring 0.7 cm and nodular opacities within left upper lobe paramediastinal and right lower lobe, which may be infectious or malignant.  Cirrhotic liver morphology.   Electronically Signed   By: Carlos Levering M.D.   On: 08/07/2013 22:06     Oren Binet, MD  [facility] Pager:[phone]  If 7PM-7AM, please contact night-coverage [email or web address] Password [email or web address] 08/09/2013, 2:44 PM   LOS: 2 days

## 2013-08-10 ENCOUNTER — Inpatient Hospital Stay (HOSPITAL_COMMUNITY): Payer: Medicare Other

## 2013-08-10 DIAGNOSIS — I10 Essential (primary) hypertension: Secondary | ICD-10-CM

## 2013-08-10 LAB — CBC
HEMATOCRIT: 38.6 % (ref 36.0–46.0)
Hemoglobin: 12.2 g/dL (ref 12.0–15.0)
MCH: 28.3 pg (ref 26.0–34.0)
MCHC: 31.6 g/dL (ref 30.0–36.0)
MCV: 89.6 fL (ref 78.0–100.0)
Platelets: 385 10*3/uL (ref 150–400)
RBC: 4.31 MIL/uL (ref 3.87–5.11)
RDW: 17.4 % — AB (ref 11.5–15.5)
WBC: 12.9 10*3/uL — ABNORMAL HIGH (ref 4.0–10.5)

## 2013-08-10 LAB — BASIC METABOLIC PANEL
BUN: 20 mg/dL (ref 6–23)
CHLORIDE: 98 meq/L (ref 96–112)
CO2: 28 mEq/L (ref 19–32)
Calcium: 9.1 mg/dL (ref 8.4–10.5)
Creatinine, Ser: 0.44 mg/dL — ABNORMAL LOW (ref 0.50–1.10)
Glucose, Bld: 182 mg/dL — ABNORMAL HIGH (ref 70–99)
Potassium: 4 mEq/L (ref 3.7–5.3)
Sodium: 139 mEq/L (ref 137–147)

## 2013-08-10 LAB — GLUCOSE, CAPILLARY
GLUCOSE-CAPILLARY: 120 mg/dL — AB (ref 70–99)
GLUCOSE-CAPILLARY: 321 mg/dL — AB (ref 70–99)
Glucose-Capillary: 220 mg/dL — ABNORMAL HIGH (ref 70–99)
Glucose-Capillary: 259 mg/dL — ABNORMAL HIGH (ref 70–99)

## 2013-08-10 MED ORDER — WHITE PETROLATUM GEL
Status: AC
  Administered 2013-08-10: 0.2
  Filled 2013-08-10: qty 5

## 2013-08-10 MED ORDER — AMLODIPINE BESYLATE 5 MG PO TABS
5.0000 mg | ORAL_TABLET | Freq: Every day | ORAL | Status: DC
  Administered 2013-08-10 – 2013-08-11 (×2): 5 mg via ORAL
  Filled 2013-08-10 (×2): qty 1

## 2013-08-10 NOTE — Plan of Care (Signed)
Problem: Phase I Progression Outcomes Goal: Initial discharge plan identified Outcome: Completed/Met Date Met:  08/10/13 To return home with H/H  Problem: Phase II Progression Outcomes Goal: Wean O2 if indicated Outcome: Completed/Met Date Met:  08/10/13 Pt. Went from venti mask to 4l [place] this am Goal: Progress activity as tolerated unless otherwise ordered Outcome: Completed/Met Date Met:  08/10/13 Ambulating in halls with O2 today

## 2013-08-10 NOTE — Progress Notes (Signed)
Pt. Ambulated 50 ft. With walker, 3L O2 venturi mask at 28%  and continuous pulse ox. O2 saturations began at 91% and fell to 78% by the end of the 50 ft. Now resting in room. Saturations starting to climb with rest. Will continue to monitor pt.

## 2013-08-10 NOTE — Progress Notes (Signed)
PATIENT DETAILS Name: Jessica Rollins Age: 58 y.o. Sex: female Date of Birth: 1956-06-29 Admit Date: 08/07/2013 Admitting Physician Robbie Lis, MD CBS:[id number], Leafy Kindle., MD  Subjective She apparently desaturated on ambulation this morning. Currently on a Ventimask. However not in acute distress.  Assessment/Plan: Principal Problem:   Acute on chronic respiratory failure with hypoxia - Secondary to pneumonia, underlying COPD and pulmonary fibrosis. Currently on a Ventimask, we'll see if we can titrate her back to nasal cannula, suspect she will have increased oxygen requirement in the near future. - Continue with nebulized bronchodilators, IV antibiotics, steroids. - Encourage further well, incentive spirometry.  Community acquired pneumonia - Continue with Rocephin and Zithromax since 129 - Urine Legionella and antigen and negative - Blood cultures negative so far - Influenza PCR negative - Sputum cultures-pending  COPD with acute exacerbation - Improving - Continue Solu-Medrol-but will taper - Continue with nebulized bronchodilators  Pulmonary fibrosis - Continue on Imuran - On steroids- apparently on home regimen of 10 mg of prednisone daily- currently on IV Solu-Medrol.  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 Switch  to amlodipine.  Diabetes - Stable with Lantus and SSI. - Metformin on hold  Dyslipidemia - Stable, continue with niacin  GERD - On PPI and Pepcid-stable  Anxiety with depression - Stable with lorazepam, Paxil  Overactive bladder  -continue ditropan  Obstructive sleep apnea - CPAP each  bedtime  Disposition: Remain inpatient  DVT Prophylaxis: Prophylactic Heparin   Code Status: Full code   Family Communication None at bedside  Procedures:  None  CONSULTS:  pulmonary/intensive care  Time spent 40 minutes-which includes 50% of the time with face-to-face with patient/ family and coordinating care related to the above assessment and plan.  MEDICATIONS: Scheduled Meds: . albuterol  5 mg Nebulization QID  . atorvastatin  20 mg Oral q1800  . azaTHIOprine  50 mg Oral BID  . azithromycin  500 mg Oral q1800  . calcium-vitamin D  1 tablet Oral Q breakfast  . cefTRIAXone (ROCEPHIN)  IV  1 g Intravenous Q24H  . famotidine  10 mg Oral Daily  . folic acid  1 mg Oral BID  . guaiFENesin  600 mg Oral BID  . heparin subcutaneous  5,000 Units Subcutaneous Q8H  . insulin aspart  0-15 Units Subcutaneous TID WC  . insulin glargine  5 Units Subcutaneous Daily  . lisinopril  10 mg Oral Daily  . LORazepam  1 mg Oral QHS  . methylPREDNISolone (SOLU-MEDROL) injection  40 mg Intravenous Q12H  . multivitamin with minerals  1 tablet Oral Daily  . niacin  500 mg Oral Daily  . nicotine  21 mg Transdermal Daily  . omega-3 acid ethyl esters  1 g Oral Daily  . oxybutynin  10 mg Oral Daily  . pantoprazole  40 mg Oral Daily  . PARoxetine  30 mg Oral Daily  . potassium chloride  10 mEq Oral Daily   Continuous Infusions:  PRN Meds:.acetaminophen, acetaminophen, albuterol, chlorpheniramine-HYDROcodone, hydrALAZINE, HYDROcodone-acetaminophen, ipratropium, ondansetron (ZOFRAN) IV, ondansetron  Antibiotics: Anti-infectives   Start     Dose/Rate Route Frequency Ordered Stop   08/08/13 1800  azithromycin (ZITHROMAX) tablet 500 mg     500  mg Oral Daily-1800 08/08/13 1348     08/08/13 1600  azithromycin (ZITHROMAX) 500 mg in dextrose 5 % 250 mL IVPB  Status:  Discontinued     500 mg 250 mL/hr over 60 Minutes Intravenous Every 24 hours 08/08/13 0927 08/08/13 1348   08/08/13 1000   ceFEPIme (MAXIPIME) 1 g in dextrose 5 % 50 mL IVPB  Status:  Discontinued     1 g 100 mL/hr over 30 Minutes Intravenous Every 12 hours 08/08/13 0856 08/08/13 0925   08/08/13 1000  cefTRIAXone (ROCEPHIN) 1 g in dextrose 5 % 50 mL IVPB     1 g 100 mL/hr over 30 Minutes Intravenous Every 24 hours 08/08/13 0927 08/15/13 0959   08/08/13 0945  vancomycin (VANCOCIN) 1,250 mg in sodium chloride 0.9 % 250 mL IVPB  Status:  Discontinued     1,250 mg 166.7 mL/hr over 90 Minutes Intravenous NOW 08/08/13 0904 08/08/13 0925   08/07/13 1645  cefTRIAXone (ROCEPHIN) 1 g in dextrose 5 % 50 mL IVPB  Status:  Discontinued     1 g 100 mL/hr over 30 Minutes Intravenous Every 24 hours 08/07/13 1638 08/08/13 0856   08/07/13 1645  azithromycin (ZITHROMAX) 500 mg in dextrose 5 % 250 mL IVPB  Status:  Discontinued     500 mg 250 mL/hr over 60 Minutes Intravenous Every 24 hours 08/07/13 1638 08/08/13 0856   08/07/13 1600  azithromycin (ZITHROMAX) 500 mg in dextrose 5 % 250 mL IVPB     500 mg 250 mL/hr over 60 Minutes Intravenous  Once 08/07/13 1559 08/07/13 1731       PHYSICAL EXAM: Vital signs in last 24 hours: Filed Vitals:   08/09/13 2235 08/10/13 0631 08/10/13 0649 08/10/13 0750  BP:   160/79   Pulse: 68  68   Temp:   97.9 F (36.6 C)   TempSrc:   Oral   Resp: 20  22   Height:      Weight:  65.409 kg (144 lb 3.2 oz)    SpO2: 92%  92% 93%    Weight change: 2.359 kg (5 lb 3.2 oz) Filed Weights   08/08/13 0500 08/09/13 0500 08/10/13 0631  Weight: 62.6 kg (138 lb 0.1 oz) 63.05 kg (139 lb) 65.409 kg (144 lb 3.2 oz)   Body mass index is 25.55 kg/(m^2).   Gen Exam: Awake and alert with clear speech.  Not in any distress. Neck: Supple, No JVD.   Chest: B/L Clear.  Few bibasilar rales this morning. CVS: S1 S2 Regular, no murmurs.  Abdomen: soft, BS +, non tender, non distended.  Extremities: no edema, lower extremities warm to touch Neurologic: Non Focal.   Skin: No Rash.   Wounds: N/A.     Intake/Output from previous day:  Intake/Output Summary (Last 24 hours) at 08/10/13 1030 Last data filed at 08/10/13 0535  Gross per 24 hour  Intake    960 ml  Output   3000 ml  Net  -2040 ml     LAB RESULTS: CBC  Recent Labs Lab 08/07/13 1309 08/07/13 1936 08/08/13 0550 08/09/13 0500 08/10/13 0600  WBC 12.7* 11.8* 9.9 15.2* 12.9*  HGB 12.4 12.1 11.7* 12.7 12.2  HCT 37.6 37.2 36.5 39.7 38.6  PLT 389 378 364 429* 385  MCV 87.6 87.7 88.4 89.0 89.6  MCH 28.9 28.5 28.3 28.5 28.3  MCHC 33.0 32.5 32.1 32.0 31.6  RDW 17.9* 17.8* 17.8* 17.4* 17.4*  LYMPHSABS  --  1.5  --   --   --  MONOABS  --  0.8  --   --   --   EOSABS  --  0.3  --   --   --   BASOSABS  --  0.0  --   --   --     Chemistries   Recent Labs Lab 08/07/13 1309 08/07/13 1936 08/08/13 0550 08/09/13 0500 08/10/13 0600  NA 139 142 142 140 139  K 3.7 3.7 4.7 4.5 4.0  CL 99 100 103 99 98  CO2 25 25 24 26 28   GLUCOSE 280* 186* 291* 205* 182*  BUN 15 15 15 16 20   CREATININE 0.40* 0.45* 0.42* 0.38* 0.44*  CALCIUM 9.0 8.9 9.2 9.4 9.1  MG  --  2.0  --   --   --     CBG:  Recent Labs Lab 08/09/13 0733 08/09/13 1156 08/09/13 1806 08/09/13 2216 08/10/13 0814  GLUCAP 165* 194* 270* 204* 120*    GFR Estimated Creatinine Clearance: 69.7 ml/min (by C-G formula based on Cr of 0.44).  Coagulation profile  Recent Labs Lab 08/07/13 1936  INR 0.99    Cardiac Enzymes No results found for this basename: CK, CKMB, TROPONINI, MYOGLOBIN,  in the last 168 hours  No components found with this basename: POCBNP,  No results found for this basename: DDIMER,  in the last 72 hours No results found for this basename: HGBA1C,  in the last 72 hours No results found for this basename: CHOL, HDL, LDLCALC, TRIG, CHOLHDL, LDLDIRECT,  in the last 72 hours  Recent Labs  08/07/13 1936  TSH 2.114   No results found for this basename: VITAMINB12, FOLATE, FERRITIN, TIBC, IRON, RETICCTPCT,  in the last 72  hours No results found for this basename: LIPASE, AMYLASE,  in the last 72 hours  Urine Studies No results found for this basename: UACOL, UAPR, USPG, UPH, UTP, UGL, UKET, UBIL, UHGB, UNIT, UROB, ULEU, UEPI, UWBC, URBC, UBAC, CAST, CRYS, UCOM, BILUA,  in the last 72 hours  MICROBIOLOGY: Recent Results (from the past 240 hour(s))  URINE CULTURE     Status: None   Collection Time    08/07/13  3:05 PM      Result Value Range Status   Specimen Description URINE, CLEAN CATCH   Final   Special Requests NONE   Final   Culture  Setup Time     Final   Value: 08/07/2013 20:38     Performed at [facility]     Final   Value: NO GROWTH     Performed at [facility]   Culture     Final   Value: NO GROWTH     Performed at [facility]   Report Status 08/08/2013 FINAL   Final  CULTURE, BLOOD (ROUTINE X 2)     Status: None   Collection Time    08/07/13  4:05 PM      Result Value Range Status   Specimen Description BLOOD ARM LEFT   Final   Special Requests BOTTLES DRAWN AEROBIC AND ANAEROBIC 10CC   Final   Culture  Setup Time     Final   Value: 08/08/2013 00:10     Performed at [facility]   Culture     Final   Value:        BLOOD CULTURE RECEIVED NO GROWTH TO DATE CULTURE WILL BE HELD FOR 5 DAYS BEFORE ISSUING A FINAL NEGATIVE REPORT     Performed at  [facility]   Report Status PENDING   Incomplete  CULTURE, BLOOD (ROUTINE X 2)     Status: None   Collection Time    08/07/13  4:15 PM      Result Value Range Status   Specimen Description BLOOD HAND LEFT   Final   Special Requests BOTTLES DRAWN AEROBIC ONLY 10CC   Final   Culture  Setup Time     Final   Value: 08/08/2013 00:09     Performed at [facility]   Culture     Final   Value:        BLOOD CULTURE RECEIVED NO GROWTH TO DATE CULTURE WILL BE HELD FOR 5 DAYS BEFORE ISSUING A FINAL NEGATIVE REPORT     Performed at [facility]   Report Status PENDING    Incomplete  URINE CULTURE     Status: None   Collection Time    08/07/13  9:12 PM      Result Value Range Status   Specimen Description URINE, RANDOM   Final   Special Requests NONE   Final   Culture  Setup Time     Final   Value: 08/07/2013 22:43     Performed at [facility]     Final   Value: NO GROWTH     Performed at [facility]   Culture     Final   Value: NO GROWTH     Performed at [facility]   Report Status 08/09/2013 FINAL   Final  CULTURE, EXPECTORATED SPUTUM-ASSESSMENT     Status: None   Collection Time    08/09/13  5:25 AM      Result Value Range Status   Specimen Description SPUTUM   Final   Special Requests NONE   Final   Sputum evaluation     Final   Value: THIS SPECIMEN IS ACCEPTABLE. RESPIRATORY CULTURE REPORT TO FOLLOW.   Report Status 08/09/2013 FINAL   Final    RADIOLOGY STUDIES/RESULTS: Dg Chest 2 View  08/07/2013   CLINICAL DATA:  Cough and congestion and chest pain. History of COPD and pulmonary fibrosis and tobacco use  EXAM: CHEST  2 VIEW  COMPARISON:  DG CHEST 2 VIEW dated 12/20/2012  FINDINGS: The lungs are hyperinflated. Diffusely increased interstitial markings are present and not greatly changed. There is increased density at the left lung base posteriorly consistent with pneumonia. The cardiac silhouette is not enlarged. The pulmonary vascularity is prominent centrally but stable. There is mild soft tissue fullness in the right paratracheal region which is not clearly new. The observed portions of the bony thorax exhibit no acute abnormalities.  IMPRESSION: The findings are consistent with COPD and pulmonary fibrosis with superimposed left lower lobe pneumonia. There is no overt evidence of CHF. There is mild soft tissue fullness in the right peritracheal region. When the patient can tolerate the procedure, a follow-up chest CT scan is recommended to further evaluate the pulmonary parenchyma and mediastinum.    Electronically Signed   By: David  Martinique   On: 08/07/2013 15:55   Ct Chest W Contrast  08/07/2013   CLINICAL DATA:  Shortness of breath, productive cough  EXAM: CT CHEST WITH CONTRAST  TECHNIQUE: Multidetector CT imaging of the chest was performed during intravenous contrast administration.  CONTRAST:  63mL OMNIPAQUE IOHEXOL 300 MG/ML  SOLN  COMPARISON:  08/07/2013 radiograph  FINDINGS: Scattered atherosclerosis of the aorta and branch vessels. Normal  caliber. Pulmonary arterial vessels are patent centrally. The bolus timing is not optimized to evaluate the more peripheral vessels. Heart size within normal limits. Coronary artery calcification.  Mediastinal and right greater than left hilar lymphadenopathy. As index, a right paratracheal conglomerate measuring 2.9 x 3.9 cm on image 19 of series 2 and a right hilar node measuring 2.5 cm on image 31. 1.0 cm short axis node medial to the left common carotid artery on image 12. Left hilar nodes measure sub cm short axis however indeterminate.  Upper abdominal images show a nodular liver contour which may reflect underlying cirrhosis. Small hiatal hernia.  There is a 1.9 x 2.2 cm nodule within the right upper lobe anteriorly on image 28 series 3 with spiculated margins. Just superior to this, there is a 0.7 cm satellite nodule on image 25. Background emphysema and peripheral reticular opacities. Patchy right upper lobe airspace opacities are nonspecific. Bilateral lower lobe consolidations. Clustered nodular opacities within the right lower lobe measuring up to 1.2 cm on image 38 of series 3. Left upper lobe paramediastinal 0.9 cm nodule on image 24. Ground-glass opacity left upper lobe nodule subpleural measuring 0.7 cm on image 18.  The central airways are patent the some of the lower lobe bronchioles appear impacted. No pneumothorax.  No acute osseous finding.  IMPRESSION: 2.2 cm right upper lobe nodule is suspicious for a primary lung cancer. There is a 0.7 cm  satellite nodule. Marked mediastinal and right hilar lymphadenopathy. Left hilar lymph nodes are indeterminate.  Bilateral lower lobe airspace opacities may reflect pneumonia or aspiration. Background emphysema and peripheral reticular opacities.  Indeterminate ground-glass opacity nodule within the left lung measuring 0.7 cm and nodular opacities within left upper lobe paramediastinal and right lower lobe, which may be infectious or malignant.  Cirrhotic liver morphology.   Electronically Signed   By: Carlos Levering M.D.   On: 08/07/2013 22:06    Oren Binet, MD  [facility] Pager:[phone]  If 7PM-7AM, please contact night-coverage [email or web address] Password [email or web address] 08/10/2013, 10:30 AM   LOS: 3 days

## 2013-08-11 ENCOUNTER — Institutional Professional Consult (permissible substitution)
Admission: AD | Admit: 2013-08-11 | Discharge: 2013-08-22 | Disposition: A | Discharge status: Home / Self Care | Payer: Medicare Other | Source: Ambulatory Visit | Attending: Internal Medicine | Admitting: Internal Medicine

## 2013-08-11 ENCOUNTER — Telehealth: Payer: Self-pay | Admitting: Emergency Medicine

## 2013-08-11 DIAGNOSIS — Z9989 Dependence on other enabling machines and devices: Secondary | ICD-10-CM

## 2013-08-11 DIAGNOSIS — F419 Anxiety disorder, unspecified: Secondary | ICD-10-CM

## 2013-08-11 DIAGNOSIS — J189 Pneumonia, unspecified organism: Secondary | ICD-10-CM

## 2013-08-11 DIAGNOSIS — J9601 Acute respiratory failure with hypoxia: Secondary | ICD-10-CM

## 2013-08-11 DIAGNOSIS — J449 Chronic obstructive pulmonary disease, unspecified: Secondary | ICD-10-CM

## 2013-08-11 DIAGNOSIS — G4733 Obstructive sleep apnea (adult) (pediatric): Secondary | ICD-10-CM

## 2013-08-11 DIAGNOSIS — R918 Other nonspecific abnormal finding of lung field: Secondary | ICD-10-CM

## 2013-08-11 DIAGNOSIS — J8410 Pulmonary fibrosis, unspecified: Secondary | ICD-10-CM

## 2013-08-11 LAB — CBC
HCT: 39.8 % (ref 36.0–46.0)
HEMOGLOBIN: 12.7 g/dL (ref 12.0–15.0)
MCH: 28.3 pg (ref 26.0–34.0)
MCHC: 31.9 g/dL (ref 30.0–36.0)
MCV: 88.6 fL (ref 78.0–100.0)
Platelets: 376 10*3/uL (ref 150–400)
RBC: 4.49 MIL/uL (ref 3.87–5.11)
RDW: 17.1 % — ABNORMAL HIGH (ref 11.5–15.5)
WBC: 14.5 10*3/uL — ABNORMAL HIGH (ref 4.0–10.5)

## 2013-08-11 LAB — CULTURE, RESPIRATORY: CULTURE: NORMAL

## 2013-08-11 LAB — GLUCOSE, CAPILLARY
Glucose-Capillary: 164 mg/dL — ABNORMAL HIGH (ref 70–99)
Glucose-Capillary: 294 mg/dL — ABNORMAL HIGH (ref 70–99)

## 2013-08-11 MED ORDER — NICOTINE 21 MG/24HR TD PT24: 21.0000 mg | MEDICATED_PATCH | Freq: Every day | TRANSDERMAL | Status: DC

## 2013-08-11 MED ORDER — ALBUTEROL SULFATE (2.5 MG/3ML) 0.083% IN NEBU: 5.0000 mg | INHALATION_SOLUTION | Freq: Four times a day (QID) | RESPIRATORY_TRACT | Status: DC

## 2013-08-11 MED ORDER — AMLODIPINE BESYLATE 5 MG PO TABS: 5.0000 mg | ORAL_TABLET | Freq: Every day | ORAL | Status: DC

## 2013-08-11 MED ORDER — ADULT MULTIVITAMIN W/MINERALS CH: 1.0000 | ORAL_TABLET | Freq: Every day | ORAL | Status: AC

## 2013-08-11 MED ORDER — CALCIUM CARBONATE-VITAMIN D 500-200 MG-UNIT PO TABS: 1.0000 | ORAL_TABLET | Freq: Every day | ORAL | Status: DC

## 2013-08-11 MED ORDER — HEPARIN SODIUM (PORCINE) 5000 UNIT/ML IJ SOLN: 5000.0000 [IU] | Freq: Three times a day (TID) | INTRAMUSCULAR | Status: DC

## 2013-08-11 MED ORDER — INSULIN ASPART 100 UNIT/ML ~~LOC~~ SOLN: SUBCUTANEOUS | Status: DC

## 2013-08-11 MED ORDER — HYDROCOD POLST-CHLORPHEN POLST 10-8 MG/5ML PO LQCR: 5.0000 mL | Freq: Two times a day (BID) | ORAL | Status: DC | PRN

## 2013-08-11 MED ORDER — IPRATROPIUM BROMIDE 0.02 % IN SOLN: 0.5000 mg | Freq: Four times a day (QID) | RESPIRATORY_TRACT | Status: DC

## 2013-08-11 MED ORDER — DEXTROSE 5 % IV SOLN: 1.0000 g | INTRAVENOUS | Status: DC

## 2013-08-11 MED ORDER — ALBUTEROL SULFATE (2.5 MG/3ML) 0.083% IN NEBU: 2.5000 mg | INHALATION_SOLUTION | RESPIRATORY_TRACT | Status: AC | PRN

## 2013-08-11 MED ORDER — AZITHROMYCIN 500 MG PO TABS: 500.0000 mg | ORAL_TABLET | Freq: Every day | ORAL | Status: DC

## 2013-08-11 MED ORDER — METHYLPREDNISOLONE SODIUM SUCC 40 MG IJ SOLR: 40.0000 mg | Freq: Two times a day (BID) | INTRAMUSCULAR | Status: DC

## 2013-08-11 MED ORDER — INSULIN GLARGINE 100 UNIT/ML ~~LOC~~ SOLN: 5.0000 [IU] | Freq: Every day | SUBCUTANEOUS | Status: DC

## 2013-08-11 NOTE — Discharge Summary (Signed)
PATIENT DETAILS Name: Jessica Rollins Age: 58 y.o. Sex: female Date of Birth: 1955-08-19 MRN: [id number]. Admit Date: 08/07/2013 Admitting Physician: Robbie Lis, MD [id number], Leafy Kindle., MD  Recommendations for Outpatient Follow-up:  1. Patient will need follow up with pulmonology while at [facility] 2. Patient will need a bronchoscopy and biopsy of the right upper lung nodule that is highly suspicious for primary bronchogenic carcinoma 3. Currently requiring high amounts of oxygen to maintain O2 saturation, will need to be slowly titrated down to her usual home regimen of 2 L at rest and 3-4 L on ambulation 4. Please follow sputum cultures, blood cultures till negative  PRIMARY DISCHARGE DIAGNOSIS:  Principal Problem:   Acute respiratory failure with hypoxia Active Problems:   Pulmonary fibrosis   COPD (chronic obstructive pulmonary disease)   Diabetes mellitus without complication   OSA on CPAP   Hypertension   Anxiety   Overactive bladder   CAP (community acquired pneumonia)   Pulmonary nodules      PAST MEDICAL HISTORY: Past Medical History  Diagnosis Date  . COPD (chronic obstructive pulmonary disease)   . Pulmonary fibrosis     diagnosed with this 2001.    . Diabetes mellitus without complication   . Hiatal hernia   . Depression   . Anxiety   . High cholesterol   . Hypertension   . Osteoporosis   . OSA on CPAP     13cm  . Overactive bladder   . Heart murmur     DISCHARGE MEDICATIONS:   Medication List    STOP taking these medications       albuterol 108 (90 BASE) MCG/ACT inhaler  Commonly known as:  PROVENTIL HFA;VENTOLIN HFA  Replaced by:  albuterol (2.5 MG/3ML) 0.083% nebulizer solution     Fluticasone-Salmeterol 250-50 MCG/DOSE Aepb  Commonly known as:  ADVAIR     lisinopril 10 MG tablet  Commonly known as:  PRINIVIL,ZESTRIL     metFORMIN 500 MG (MOD) 24 hr tablet  Commonly known as:  GLUMETZA     tiotropium 18 MCG inhalation capsule    Commonly known as:  SPIRIVA      TAKE these medications       albuterol (2.5 MG/3ML) 0.083% nebulizer solution  Commonly known as:  PROVENTIL  Take 3 mLs (2.5 mg total) by nebulization every 2 (two) hours as needed for wheezing or shortness of breath.     albuterol (2.5 MG/3ML) 0.083% nebulizer solution  Commonly known as:  PROVENTIL  Take 6 mLs (5 mg total) by nebulization 4 (four) times daily.     amLODipine 5 MG tablet  Commonly known as:  NORVASC  Take 1 tablet (5 mg total) by mouth daily.     azaTHIOprine 50 MG tablet  Commonly known as:  IMURAN  Take 50 mg by mouth 2 (two) times daily.     azithromycin 500 MG tablet  Commonly known as:  ZITHROMAX  Take 1 tablet (500 mg total) by mouth daily at 6 PM.     calcium citrate-vitamin D 315-200 MG-UNIT per tablet  Commonly known as:  CITRACAL+D  Take 1 tablet by mouth daily.     calcium-vitamin D 500-200 MG-UNIT per tablet  Commonly known as:  OSCAL WITH D  Take 1 tablet by mouth daily with breakfast.     chlorpheniramine-HYDROcodone 10-8 MG/5ML Lqcr  Commonly known as:  TUSSIONEX  Take 5 mLs by mouth every 12 (twelve) hours as needed for cough.  dextrose 5 % SOLN 50 mL with cefTRIAXone 1 G SOLR 1 g  Inject 1 g into the vein daily.     esomeprazole 40 MG capsule  Commonly known as:  NEXIUM  Take 40 mg by mouth daily before breakfast.     Fish Oil 300 MG Caps  Take 1 capsule by mouth daily.     folic acid 1 MG tablet  Commonly known as:  FOLVITE  Take 1 mg by mouth 2 (two) times daily.     guaiFENesin 600 MG 12 hr tablet  Commonly known as:  MUCINEX  Take 600 mg by mouth 2 (two) times daily.     heparin 5000 UNIT/ML injection  Inject 1 mL (5,000 Units total) into the skin every 8 (eight) hours.     insulin aspart 100 UNIT/ML injection  Commonly known as:  novoLOG  - 0-15 Units, Subcutaneous, 3 times daily with meals  - Correction coverage: Moderate (average weight, post-op)  - CBG < 70: implement  hypoglycemia protocol  - CBG 70 - 120: 0 units  - CBG 121 - 150: 2 units  - CBG 151 - 200: 3 units  - CBG 201 - 250: 5 units  - CBG 251 - 300: 8 units  - CBG 301 - 350: 11 units  - CBG 351 - 400: 15 units  - CBG > 400: call MD and obtain STAT lab verification     insulin glargine 100 UNIT/ML injection  Commonly known as:  LANTUS  Inject 0.05 mLs (5 Units total) into the skin daily.     ipratropium 0.02 % nebulizer solution  Commonly known as:  ATROVENT  Take 2.5 mLs (0.5 mg total) by nebulization 4 (four) times daily.     IRON PO  Take 1 tablet by mouth 4 (four) times daily.     LORazepam 1 MG tablet  Commonly known as:  ATIVAN  Take 1 mg by mouth at bedtime.     methylPREDNISolone sodium succinate 40 mg/mL injection  Commonly known as:  SOLU-MEDROL  Inject 1 mL (40 mg total) into the vein every 12 (twelve) hours.     multivitamin with minerals Tabs tablet  Take 1 tablet by mouth daily.     niacin 500 MG tablet  Take 500 mg by mouth daily.     nicotine 21 mg/24hr patch  Commonly known as:  NICODERM CQ - dosed in mg/24 hours  Place 1 patch (21 mg total) onto the skin daily.     oxybutynin 10 MG 24 hr tablet  Commonly known as:  DITROPAN-XL  Take 10 mg by mouth daily.     PARoxetine 30 MG tablet  Commonly known as:  PAXIL  Take 30 mg by mouth daily.     potassium chloride 10 MEQ tablet  Commonly known as:  K-DUR  Take 10 mEq by mouth daily.     rosuvastatin 10 MG tablet  Commonly known as:  CRESTOR  Take 10 mg by mouth daily.     TAGAMET PO  Take 1 tablet by mouth daily.        ALLERGIES:  No Known Allergies  BRIEF HPI:  See H&P, Labs, Consult and Test reports for all details in brief, patient was admitted for worsening shortness of breath. Patient has a very complicated history of pulmonary issues including COPD on home oxygen, pulmonary fibrosis on chronic prednisone and Imuran. She was then admitted to the hospitalist service for further  evaluation and treatment.  CONSULTATIONS:  pulmonary/intensive care- Dr. Baltazar Apo [place] pulmonology  PERTINENT RADIOLOGIC STUDIES: Dg Chest 2 View  08/07/2013   CLINICAL DATA:  Cough and congestion and chest pain. History of COPD and pulmonary fibrosis and tobacco use  EXAM: CHEST  2 VIEW  COMPARISON:  DG CHEST 2 VIEW dated 12/20/2012  FINDINGS: The lungs are hyperinflated. Diffusely increased interstitial markings are present and not greatly changed. There is increased density at the left lung base posteriorly consistent with pneumonia. The cardiac silhouette is not enlarged. The pulmonary vascularity is prominent centrally but stable. There is mild soft tissue fullness in the right paratracheal region which is not clearly new. The observed portions of the bony thorax exhibit no acute abnormalities.  IMPRESSION: The findings are consistent with COPD and pulmonary fibrosis with superimposed left lower lobe pneumonia. There is no overt evidence of CHF. There is mild soft tissue fullness in the right peritracheal region. When the patient can tolerate the procedure, a follow-up chest CT scan is recommended to further evaluate the pulmonary parenchyma and mediastinum.   Electronically Signed   By: David  Martinique   On: 08/07/2013 15:55   Ct Chest W Contrast  08/07/2013   CLINICAL DATA:  Shortness of breath, productive cough  EXAM: CT CHEST WITH CONTRAST  TECHNIQUE: Multidetector CT imaging of the chest was performed during intravenous contrast administration.  CONTRAST:  67mL OMNIPAQUE IOHEXOL 300 MG/ML  SOLN  COMPARISON:  08/07/2013 radiograph  FINDINGS: Scattered atherosclerosis of the aorta and branch vessels. Normal caliber. Pulmonary arterial vessels are patent centrally. The bolus timing is not optimized to evaluate the more peripheral vessels. Heart size within normal limits. Coronary artery calcification.  Mediastinal and right greater than left hilar lymphadenopathy. As index, a right paratracheal  conglomerate measuring 2.9 x 3.9 cm on image 19 of series 2 and a right hilar node measuring 2.5 cm on image 31. 1.0 cm short axis node medial to the left common carotid artery on image 12. Left hilar nodes measure sub cm short axis however indeterminate.  Upper abdominal images show a nodular liver contour which may reflect underlying cirrhosis. Small hiatal hernia.  There is a 1.9 x 2.2 cm nodule within the right upper lobe anteriorly on image 28 series 3 with spiculated margins. Just superior to this, there is a 0.7 cm satellite nodule on image 25. Background emphysema and peripheral reticular opacities. Patchy right upper lobe airspace opacities are nonspecific. Bilateral lower lobe consolidations. Clustered nodular opacities within the right lower lobe measuring up to 1.2 cm on image 38 of series 3. Left upper lobe paramediastinal 0.9 cm nodule on image 24. Ground-glass opacity left upper lobe nodule subpleural measuring 0.7 cm on image 18.  The central airways are patent the some of the lower lobe bronchioles appear impacted. No pneumothorax.  No acute osseous finding.  IMPRESSION: 2.2 cm right upper lobe nodule is suspicious for a primary lung cancer. There is a 0.7 cm satellite nodule. Marked mediastinal and right hilar lymphadenopathy. Left hilar lymph nodes are indeterminate.  Bilateral lower lobe airspace opacities may reflect pneumonia or aspiration. Background emphysema and peripheral reticular opacities.  Indeterminate ground-glass opacity nodule within the left lung measuring 0.7 cm and nodular opacities within left upper lobe paramediastinal and right lower lobe, which may be infectious or malignant.  Cirrhotic liver morphology.   Electronically Signed   By: Carlos Levering M.D.   On: 08/07/2013 22:06   Dg Chest Port 1 View  08/10/2013   CLINICAL DATA:  Shortness  of breath with exertion.  EXAM: PORTABLE CHEST - 1 VIEW  COMPARISON:  CT CHEST W/CM dated 08/07/2013; DG CHEST 2 VIEW dated 08/07/2013; DG  CHEST 2 VIEW dated 12/20/2012  FINDINGS: Cardiac silhouette mildly enlarged for the AP portable technique, unchanged. Chronic interstitial pulmonary fibrosis. Chronic scarring in the lung bases, with superimposed atelectasis, with worsening aeration in the lung bases since the examination is 3 days ago. No new pulmonary parenchymal abnormalities elsewhere.  IMPRESSION: 1. Worsening atelectasis and/or pneumonia in the lower lobes, superimposed upon chronic scarring and baseline interstitial pulmonary fibrosis. 2. Stable mild cardiomegaly without pulmonary edema.   Electronically Signed   By: Evangeline Dakin M.D.   On: 08/10/2013 09:32     PERTINENT LAB RESULTS: CBC:  Recent Labs  08/10/13 0600 08/11/13 0740  WBC 12.9* 14.5*  HGB 12.2 12.7  HCT 38.6 39.8  PLT 385 376   CMET CMP     Component Value Date/Time   NA 139 08/10/2013 0600   K 4.0 08/10/2013 0600   CL 98 08/10/2013 0600   CO2 28 08/10/2013 0600   GLUCOSE 182* 08/10/2013 0600   BUN 20 08/10/2013 0600   CREATININE 0.44* 08/10/2013 0600   CALCIUM 9.1 08/10/2013 0600   PROT 6.6 08/08/2013 0550   ALBUMIN 2.5* 08/08/2013 0550   AST 9 08/08/2013 0550   ALT 8 08/08/2013 0550   ALKPHOS 102 08/08/2013 0550   BILITOT <0.2* 08/08/2013 0550   GFRNONAA >90 08/10/2013 0600   GFRAA >90 08/10/2013 0600    GFR Estimated Creatinine Clearance: 69.5 ml/min (by C-G formula based on Cr of 0.44). No results found for this basename: LIPASE, AMYLASE,  in the last 72 hours No results found for this basename: CKTOTAL, CKMB, CKMBINDEX, TROPONINI,  in the last 72 hours No components found with this basename: POCBNP,  No results found for this basename: DDIMER,  in the last 72 hours No results found for this basename: HGBA1C,  in the last 72 hours No results found for this basename: CHOL, HDL, LDLCALC, TRIG, CHOLHDL, LDLDIRECT,  in the last 72 hours No results found for this basename: TSH, T4TOTAL, FREET3, T3FREE, THYROIDAB,  in the last 72 hours No results found for  this basename: VITAMINB12, FOLATE, FERRITIN, TIBC, IRON, RETICCTPCT,  in the last 72 hours Coags: No results found for this basename: PT, INR,  in the last 72 hours Microbiology: Recent Results (from the past 240 hour(s))  URINE CULTURE     Status: None   Collection Time    08/07/13  3:05 PM      Result Value Range Status   Specimen Description URINE, CLEAN CATCH   Final   Special Requests NONE   Final   Culture  Setup Time     Final   Value: 08/07/2013 20:38     Performed at [place]     Final   Value: NO GROWTH     Performed at [facility]   Culture     Final   Value: NO GROWTH     Performed at [facility]   Report Status 08/08/2013 FINAL   Final  CULTURE, BLOOD (ROUTINE X 2)     Status: None   Collection Time    08/07/13  4:05 PM      Result Value Range Status   Specimen Description BLOOD ARM LEFT   Final   Special Requests BOTTLES DRAWN AEROBIC AND ANAEROBIC 10CC   Final  Culture  Setup Time     Final   Value: 08/08/2013 00:10     Performed at [facility]   Culture     Final   Value:        BLOOD CULTURE RECEIVED NO GROWTH TO DATE CULTURE WILL BE HELD FOR 5 DAYS BEFORE ISSUING A FINAL NEGATIVE REPORT     Performed at [facility]   Report Status PENDING   Incomplete  CULTURE, BLOOD (ROUTINE X 2)     Status: None   Collection Time    08/07/13  4:15 PM      Result Value Range Status   Specimen Description BLOOD HAND LEFT   Final   Special Requests BOTTLES DRAWN AEROBIC ONLY 10CC   Final   Culture  Setup Time     Final   Value: 08/08/2013 00:09     Performed at [facility]   Culture     Final   Value:        BLOOD CULTURE RECEIVED NO GROWTH TO DATE CULTURE WILL BE HELD FOR 5 DAYS BEFORE ISSUING A FINAL NEGATIVE REPORT     Performed at [facility]   Report Status PENDING   Incomplete  URINE CULTURE     Status: None   Collection Time    08/07/13  9:12 PM      Result Value Range Status    Specimen Description URINE, RANDOM   Final   Special Requests NONE   Final   Culture  Setup Time     Final   Value: 08/07/2013 22:43     Performed at [facility]     Final   Value: NO GROWTH     Performed at [facility]   Culture     Final   Value: NO GROWTH     Performed at [facility]   Report Status 08/09/2013 FINAL   Final  CULTURE, EXPECTORATED SPUTUM-ASSESSMENT     Status: None   Collection Time    08/09/13  5:25 AM      Result Value Range Status   Specimen Description SPUTUM   Final   Special Requests NONE   Final   Sputum evaluation     Final   Value: THIS SPECIMEN IS ACCEPTABLE. RESPIRATORY CULTURE REPORT TO FOLLOW.   Report Status 08/09/2013 FINAL   Final  CULTURE, RESPIRATORY (NON-EXPECTORATED)     Status: None   Collection Time    08/09/13  5:25 AM      Result Value Range Status   Specimen Description SPUTUM   Final   Special Requests NONE   Final   Gram Stain     Final   Value: MODERATE WBC PRESENT, PREDOMINANTLY PMN     FEW SQUAMOUS EPITHELIAL CELLS PRESENT     MODERATE GRAM POSITIVE COCCI IN CHAINS     MODERATE GRAM NEGATIVE COCCI     FEW GRAM NEGATIVE RODS   Culture     Final   Value: NORMAL OROPHARYNGEAL FLORA     Performed at [facility]   Report Status 08/11/2013 FINAL   Final     BRIEF HOSPITAL COURSE:  Acute on chronic respiratory failure with hypoxia  - Secondary to pneumonia, underlying COPD and pulmonary fibrosis. - Unfortunately his she is significantly high oxygen requirements and desaturates with very minimal movement. As noted above, she is on chronic oxygen at home but only uses 2 L at  rest and 3 L ambulation. Currently she is requiring a 4-to 5 L of oxygen via nasal cannula or from a Ventimask to maintain O2 saturations, on ambulation she requires up to 7-8 L of oxygen. - At this point, we'll continue with steroids, antibiotics, incentive spirometry and see if she would be able to come down on  oxygen requirement. - She is stable to be transferred to a long-term acute care hospital for further continued care  Community acquired pneumonia  - Continue with Rocephin and Zithromax since 1/29  - Urine Legionella and antigen and negative  - Blood cultures negative so far  - Influenza PCR negative  - Sputum cultures-pending  COPD with acute exacerbation  - Improving  - Continue Solu-Medrol-but have tapered to 40 mg twice daily, taper depending on clinical scenario. Please note patient is on chronic prednisone therapy and normally takes 10 mg of prednisone on a daily basis. - Continue with nebulized bronchodilators  Pulmonary fibrosis  - Continue on Imuran  - On steroids- apparently on home regimen of 10 mg of prednisone daily- currently on IV Solu-Medrol.   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 Needs continued treatment with IV antibiotics, steroids, and scheduled nebulized bronchodilators and needs to have decreased oxygen requirements are full proceeding with a bronchoscopy with biopsy. - CT chest this admission showed worrisome nodule in the right upper lobe  - Have notified Dr. Baltazar Apo of [facility]  pulmonology that patient is being transferred [facility]- please obtain pulmonology followup while at [facility] and after discharge from [facility].  Tobacco abuse  - On transdermal nicotine  - Counseled extensively regarding the importance of completely quitting smoking   Hypertension  - Stable with lisinopril- however given cough-we'll stop lisinopril. Switch to amlodipine.   Diabetes  - Stable with Lantus and SSI.  - Metformin on hold   Dyslipidemia  - Stable, continue with niacin   GERD  - On PPI and Pepcid-stable   Anxiety with depression  - Stable with lorazepam, Paxil   Overactive bladder  -continue ditropan   Obstructive sleep apnea  - CPAP each bedtime  TODAY-DAY OF DISCHARGE:  Subjective:   Dorothey Oetken today has no headache,no chest abdominal pain,no new weakness tingling or numbness. She continues to be stable and still requires significant amount of oxygen to maintain O2 saturation. She otherwise does not appear to be in acute respiratory distress  Objective:   Blood pressure 158/88, pulse 69, temperature 98 F (36.7 C), temperature source Oral, resp. rate 18, height 5\' 3"  (1.6 m), weight 64.774 kg (142 lb 12.8 oz), SpO2 90.00%.  Intake/Output Summary (Last 24 hours) at 08/11/13 1119 Last data filed at 08/11/13 0600  Gross per 24 hour  Intake    960 ml  Output   1850 ml  Net   -890 ml   Filed Weights   08/09/13 0500 08/10/13 0631 08/11/13 0641  Weight: 63.05 kg (139 lb) 65.409 kg (144 lb 3.2 oz) 64.774 kg (142 lb 12.8 oz)    Exam Awake Alert, Oriented *3, No new F.N deficits, Normal affect Kimberly.AT,PERRAL Supple Neck,No JVD, No cervical lymphadenopathy appriciated.  Symmetrical Chest wall movement, Good air movement bilaterally, by basilar rales, scattered rhonchi. RRR,No Gallops,Rubs or new Murmurs, No Parasternal Heave +ve B.Sounds, Abd Soft, Non tender, No organomegaly appriciated, No rebound -guarding or rigidity. No Cyanosis, Clubbing or edema, No new Rash or bruise  DISCHARGE CONDITION: Stable  DISPOSITION: [facility]ACH  DISCHARGE INSTRUCTIONS:  Activity:  As tolerated with Full fall precautions use walker/cane & assistance as needed  Diet recommendation: Diabetic Diet Heart Healthy diet      Discharge Orders   Future Appointments Provider Department Dept Phone   08/12/2013 2:00 PM Collene Gobble, MD [facility] [phone]   Future Orders Complete By Expires   Diet - low sodium heart healthy  As directed    Diet general  As directed    Increase activity slowly  As directed       Follow-up Information   Follow up with Collene Gobble., MD. Schedule an appointment as soon as possible for a visit in 1 week. (after discharge from [facility])     Specialty:  Pulmonary Disease   Contact information:   [address] [address] [phone]       Follow up with Henerson, Leafy Kindle., MD. Schedule an appointment as soon as possible for a visit in 1 week. (after discharge from [facility])    Specialty:  Internal Medicine   Contact information:   [address] [place]on [place] [address] [phone]         Total Time spent on discharge equals 45 minutes.  SignedOren Binet 08/11/2013 11:19 AM

## 2013-08-11 NOTE — Telephone Encounter (Signed)
Called, spoke with pt.  She was transferred to [facility] today.  Was advised by [facility] that she would not be able to see [facility] and pulm group in [facility] as it is a different part of the hospital.  Pt states she "doesn't want to loose track with Dr. Lamonte Sakai because he is supposed to f/u with me on some nodules" and obtain records from [facility].  Pt states she was supposed to see him tomorrow in office to discuss this, but will not be able to make it now as she is still in the hospital.  I have cancelled tomorrow's appt -- pt aware.  Pt states she will call back to reschedule this appt once she is d/c'd.  Per notes today from [facility], he is aware pt has been transferred to [facility].  I also informed pt of this.  She was very grateful for our help and would like RB to know this.  Will sign off and route as FYI.

## 2013-08-11 NOTE — Progress Notes (Signed)
Jessica Rollins to be D/C'd Rehab at [facility] per MD order.  Discussed with the patient and all questions fully answered.    Medication List    STOP taking these medications       albuterol 108 (90 BASE) MCG/ACT inhaler  Commonly known as:  PROVENTIL HFA;VENTOLIN HFA  Replaced by:  albuterol (2.5 MG/3ML) 0.083% nebulizer solution     Fluticasone-Salmeterol 250-50 MCG/DOSE Aepb  Commonly known as:  ADVAIR     lisinopril 10 MG tablet  Commonly known as:  PRINIVIL,ZESTRIL     metFORMIN 500 MG (MOD) 24 hr tablet  Commonly known as:  GLUMETZA     tiotropium 18 MCG inhalation capsule  Commonly known as:  SPIRIVA      TAKE these medications       albuterol (2.5 MG/3ML) 0.083% nebulizer solution  Commonly known as:  PROVENTIL  Take 3 mLs (2.5 mg total) by nebulization every 2 (two) hours as needed for wheezing or shortness of breath.     albuterol (2.5 MG/3ML) 0.083% nebulizer solution  Commonly known as:  PROVENTIL  Take 6 mLs (5 mg total) by nebulization 4 (four) times daily.     amLODipine 5 MG tablet  Commonly known as:  NORVASC  Take 1 tablet (5 mg total) by mouth daily.     azaTHIOprine 50 MG tablet  Commonly known as:  IMURAN  Take 50 mg by mouth 2 (two) times daily.     azithromycin 500 MG tablet  Commonly known as:  ZITHROMAX  Take 1 tablet (500 mg total) by mouth daily at 6 PM.     calcium citrate-vitamin D 315-200 MG-UNIT per tablet  Commonly known as:  CITRACAL+D  Take 1 tablet by mouth daily.     calcium-vitamin D 500-200 MG-UNIT per tablet  Commonly known as:  OSCAL WITH D  Take 1 tablet by mouth daily with breakfast.     chlorpheniramine-HYDROcodone 10-8 MG/5ML Lqcr  Commonly known as:  TUSSIONEX  Take 5 mLs by mouth every 12 (twelve) hours as needed for cough.     dextrose 5 % SOLN 50 mL with cefTRIAXone 1 G SOLR 1 g  Inject 1 g into the vein daily.     esomeprazole 40 MG capsule  Commonly known as:  NEXIUM  Take 40 mg by mouth  daily before breakfast.     Fish Oil 300 MG Caps  Take 1 capsule by mouth daily.     folic acid 1 MG tablet  Commonly known as:  FOLVITE  Take 1 mg by mouth 2 (two) times daily.     guaiFENesin 600 MG 12 hr tablet  Commonly known as:  MUCINEX  Take 600 mg by mouth 2 (two) times daily.     heparin 5000 UNIT/ML injection  Inject 1 mL (5,000 Units total) into the skin every 8 (eight) hours.     insulin aspart 100 UNIT/ML injection  Commonly known as:  novoLOG  - 0-15 Units, Subcutaneous, 3 times daily with meals  - Correction coverage: Moderate (average weight, post-op)  - CBG < 70: implement hypoglycemia protocol  - CBG 70 - 120: 0 units  - CBG 121 - 150: 2 units  - CBG 151 - 200: 3 units  - CBG 201 - 250: 5 units  - CBG 251 - 300: 8 units  - CBG 301 - 350: 11 units  - CBG 351 - 400: 15 units  - CBG > 400: call MD and obtain STAT  lab verification     insulin glargine 100 UNIT/ML injection  Commonly known as:  LANTUS  Inject 0.05 mLs (5 Units total) into the skin daily.     ipratropium 0.02 % nebulizer solution  Commonly known as:  ATROVENT  Take 2.5 mLs (0.5 mg total) by nebulization 4 (four) times daily.     IRON PO  Take 1 tablet by mouth 4 (four) times daily.     LORazepam 1 MG tablet  Commonly known as:  ATIVAN  Take 1 mg by mouth at bedtime.     methylPREDNISolone sodium succinate 40 mg/mL injection  Commonly known as:  SOLU-MEDROL  Inject 1 mL (40 mg total) into the vein every 12 (twelve) hours.     multivitamin with minerals Tabs tablet  Take 1 tablet by mouth daily.     niacin 500 MG tablet  Take 500 mg by mouth daily.     nicotine 21 mg/24hr patch  Commonly known as:  NICODERM CQ - dosed in mg/24 hours  Place 1 patch (21 mg total) onto the skin daily.     oxybutynin 10 MG 24 hr tablet  Commonly known as:  DITROPAN-XL  Take 10 mg by mouth daily.     PARoxetine 30 MG tablet  Commonly known as:  PAXIL  Take 30 mg by mouth daily.      potassium chloride 10 MEQ tablet  Commonly known as:  K-DUR  Take 10 mEq by mouth daily.     rosuvastatin 10 MG tablet  Commonly known as:  CRESTOR  Take 10 mg by mouth daily.     TAGAMET PO  Take 1 tablet by mouth daily.        Vitals stable, skin intact. On 4L of oxygen. No complaints of pain. Called report to [place] at [facility]	[place]. Will transfer patient shortly.  Driggers, Allene Pyo 08/11/2013 1:51 PM

## 2013-08-11 NOTE — Progress Notes (Signed)
PCCM Brief Note  I was notified by Dr Sloan Leiter today 2/2 that pt will go to [facility] for further recovery. She will need FOB + ENB and possibly EBUS. She had an appointment w me on 2/3 but clearly we will not be able to do this now. We will follow. She will need biopsy when medically stable to undergo.   Baltazar Apo, MD, PhD 08/11/2013, 11:16 AM [facility] [phone] or if no answer [phone]

## 2013-08-12 ENCOUNTER — Other Ambulatory Visit (HOSPITAL_COMMUNITY): Payer: Self-pay

## 2013-08-12 ENCOUNTER — Inpatient Hospital Stay: Payer: Medicare Other | Admitting: Emergency Medicine

## 2013-08-12 LAB — COMPREHENSIVE METABOLIC PANEL
ALT: 16 U/L (ref 0–35)
AST: 16 U/L (ref 0–37)
Albumin: 3 g/dL — ABNORMAL LOW (ref 3.5–5.2)
Alkaline Phosphatase: 104 U/L (ref 39–117)
BUN: 23 mg/dL (ref 6–23)
CALCIUM: 9 mg/dL (ref 8.4–10.5)
CO2: 28 meq/L (ref 19–32)
CREATININE: 0.39 mg/dL — AB (ref 0.50–1.10)
Chloride: 96 mEq/L (ref 96–112)
GFR calc Af Amer: >90 mL/min (ref >90)
Glucose, Bld: 227 mg/dL — ABNORMAL HIGH (ref 70–99)
Potassium: 4.6 mEq/L (ref 3.7–5.3)
Sodium: 137 mEq/L (ref 137–147)
TOTAL PROTEIN: 6.9 g/dL (ref 6.0–8.3)
Total Bilirubin: 0.3 mg/dL (ref 0.3–1.2)

## 2013-08-12 LAB — CBC
HCT: 41.8 % (ref 36.0–46.0)
Hemoglobin: 13.3 g/dL (ref 12.0–15.0)
MCH: 28.3 pg (ref 26.0–34.0)
MCHC: 31.8 g/dL (ref 30.0–36.0)
MCV: 88.9 fL (ref 78.0–100.0)
Platelets: 379 10*3/uL (ref 150–400)
RBC: 4.7 MIL/uL (ref 3.87–5.11)
RDW: 17.1 % — ABNORMAL HIGH (ref 11.5–15.5)
WBC: 15 10*3/uL — ABNORMAL HIGH (ref 4.0–10.5)

## 2013-08-12 LAB — PREALBUMIN: Prealbumin: 25.6 mg/dL (ref 17.0–34.0)

## 2013-08-12 LAB — PROCALCITONIN: PROCALCITONIN: 0.15 ng/mL

## 2013-08-12 LAB — TSH: TSH: 0.947 u[IU]/mL (ref 0.350–4.500)

## 2013-08-13 ENCOUNTER — Telehealth: Payer: Self-pay | Admitting: Emergency Medicine

## 2013-08-13 DIAGNOSIS — G4733 Obstructive sleep apnea (adult) (pediatric): Secondary | ICD-10-CM

## 2013-08-13 DIAGNOSIS — J8410 Pulmonary fibrosis, unspecified: Secondary | ICD-10-CM

## 2013-08-13 DIAGNOSIS — R918 Other nonspecific abnormal finding of lung field: Secondary | ICD-10-CM

## 2013-08-13 DIAGNOSIS — J449 Chronic obstructive pulmonary disease, unspecified: Secondary | ICD-10-CM

## 2013-08-13 NOTE — Telephone Encounter (Signed)
Jessica Rollins returned call.  He reports when he saw his mother this Am he was told pt is scheduled for a bronch. Was told we ordered this on pt and our doc would do this on pt. Is not sure when or what time this was going to be done. Was told bronch was going to be done in pt room? He wants to make sure this is correct? Please advise RB

## 2013-08-13 NOTE — Consult Note (Signed)
Name: Jessica Rollins MRN: [id number] DOB: 1956-01-09    ADMISSION DATE:  08/11/2013 CONSULTATION DATE:  08/13/2013  REFERRING MD :  Dr. Radene Gunning PRIMARY SERVICE:  [id number]  CHIEF COMPLAINT:  Pulmonary nodule  BRIEF PATIENT DESCRIPTION: 58 year old female with history of COPD and IPF, admitted 2/2 to [facility] with rehab after an admission in [facility] on 1/29 for CAP. Chest CT discovered multiple lung nodules suspicious for malignancy.  PCCM called to evaluate need for bronchoscopy.  LINES / TUBES: PIV  CULTURES: Per [facility]  ANTIBIOTICS: Per [facility]  PAST MEDICAL HISTORY :  Past Medical History  Diagnosis Date  . COPD (chronic obstructive pulmonary disease)   . Pulmonary fibrosis     diagnosed with this 2001.    . Diabetes mellitus without complication   . Hiatal hernia   . Depression   . Anxiety   . High cholesterol   . Hypertension   . Osteoporosis   . OSA on CPAP     13cm  . Overactive bladder   . Heart murmur    Past Surgical History  Procedure Laterality Date  . Bladder suspension    . Abdominal hysterectomy      severe bleeding-when she was in her 30's  . Lung biopsy     Prior to Admission medications   Medication Sig Start Date End Date Taking? Authorizing Provider  albuterol (PROVENTIL) (2.5 MG/3ML) 0.083% nebulizer solution Take 3 mLs (2.5 mg total) by nebulization every 2 (two) hours as needed for wheezing or shortness of breath. 08/11/13   Shanker Kristeen Mans, MD  albuterol (PROVENTIL) (2.5 MG/3ML) 0.083% nebulizer solution Take 6 mLs (5 mg total) by nebulization 4 (four) times daily. 08/11/13   Shanker Kristeen Mans, MD  amLODipine (NORVASC) 5 MG tablet Take 1 tablet (5 mg total) by mouth daily. 08/11/13   Shanker Kristeen Mans, MD  azaTHIOprine (IMURAN) 50 MG tablet Take 50 mg by mouth 2 (two) times daily.    Historical Provider, MD  azithromycin (ZITHROMAX) 500 MG tablet Take 1 tablet (500 mg total) by mouth daily at 6 PM. 08/11/13   Jonetta Osgood, MD  calcium citrate-vitamin D (CITRACAL+D)  315-200 MG-UNIT per tablet Take 1 tablet by mouth daily.    Historical Provider, MD  calcium-vitamin D (OSCAL WITH D) 500-200 MG-UNIT per tablet Take 1 tablet by mouth daily with breakfast. 08/11/13   Jonetta Osgood, MD  chlorpheniramine-HYDROcodone (TUSSIONEX) 10-8 MG/5ML LQCR Take 5 mLs by mouth every 12 (twelve) hours as needed for cough. 08/11/13   Shanker Kristeen Mans, MD  Cimetidine (TAGAMET PO) Take 1 tablet by mouth daily.    Historical Provider, MD  dextrose 5 % SOLN 50 mL with cefTRIAXone 1 G SOLR 1 g Inject 1 g into the vein daily. 08/11/13   Shanker Kristeen Mans, MD  esomeprazole (NEXIUM) 40 MG capsule Take 40 mg by mouth daily before breakfast.    Historical Provider, MD  folic acid (FOLVITE) 1 MG tablet Take 1 mg by mouth 2 (two) times daily.    Historical Provider, MD  guaiFENesin (MUCINEX) 600 MG 12 hr tablet Take 600 mg by mouth 2 (two) times daily.    Historical Provider, MD  heparin 5000 UNIT/ML injection Inject 1 mL (5,000 Units total) into the skin every 8 (eight) hours. 08/11/13   Shanker Kristeen Mans, MD  insulin aspart (NOVOLOG) 100 UNIT/ML injection 0-15 Units, Subcutaneous, 3 times daily with meals Correction coverage: Moderate (average weight, post-op) CBG < 70: implement hypoglycemia protocol  CBG 70 - 120: 0 units CBG 121 - 150: 2 units CBG 151 - 200: 3 units CBG 201 - 250: 5 units CBG 251 - 300: 8 units CBG 301 - 350: 11 units CBG 351 - 400: 15 units CBG > 400: call MD and obtain STAT lab verification 08/11/13   Jonetta Osgood, MD  insulin glargine (LANTUS) 100 UNIT/ML injection Inject 0.05 mLs (5 Units total) into the skin daily. 08/11/13   Shanker Kristeen Mans, MD  ipratropium (ATROVENT) 0.02 % nebulizer solution Take 2.5 mLs (0.5 mg total) by nebulization 4 (four) times daily. 08/11/13   Shanker Kristeen Mans, MD  IRON PO Take 1 tablet by mouth 4 (four) times daily.    Historical Provider, MD  LORazepam (ATIVAN) 1 MG tablet Take 1 mg by mouth at bedtime.    Historical Provider, MD    methylPREDNISolone sodium succinate (SOLU-MEDROL) 40 mg/mL injection Inject 1 mL (40 mg total) into the vein every 12 (twelve) hours. 08/11/13   Shanker Kristeen Mans, MD  Multiple Vitamin (MULTIVITAMIN WITH MINERALS) TABS tablet Take 1 tablet by mouth daily. 08/11/13   Shanker Kristeen Mans, MD  niacin 500 MG tablet Take 500 mg by mouth daily.    Historical Provider, MD  nicotine (NICODERM CQ - DOSED IN MG/24 HOURS) 21 mg/24hr patch Place 1 patch (21 mg total) onto the skin daily. 08/11/13   Shanker Kristeen Mans, MD  Omega-3 Fatty Acids (FISH OIL) 300 MG CAPS Take 1 capsule by mouth daily.    Historical Provider, MD  oxybutynin (DITROPAN-XL) 10 MG 24 hr tablet Take 10 mg by mouth daily.    Historical Provider, MD  PARoxetine (PAXIL) 30 MG tablet Take 30 mg by mouth daily.    Historical Provider, MD  potassium chloride (K-DUR) 10 MEQ tablet Take 10 mEq by mouth daily.    Historical Provider, MD  rosuvastatin (CRESTOR) 10 MG tablet Take 10 mg by mouth daily.    Historical Provider, MD   No Known Allergies  FAMILY HISTORY:  Family History  Problem Relation Age of Onset  . Pulmonary fibrosis Brother    SOCIAL HISTORY:  reports that she has been smoking Cigarettes.  She started smoking about 50 years ago. She has been smoking about 1.50 packs per day. She does not have any smokeless tobacco history on file. She reports that she does not drink alcohol or use illicit drugs.  REVIEW OF SYSTEMS:   Constitutional: Negative for fever, chills, weight loss, malaise/fatigue and diaphoresis.  HENT: Negative for hearing loss, ear pain, nosebleeds, congestion, sore throat, neck pain, tinnitus and ear discharge.   Eyes: Negative for blurred vision, double vision, photophobia, pain, discharge and redness.  Respiratory: Negative for hemoptysis, sputum production, wheezing and stridor.  Positive for cough and SOB, requires 4 L [place]. Cardiovascular: Negative for chest pain, palpitations, orthopnea, claudication, leg swelling and  PND.  Gastrointestinal: Negative for heartburn, nausea, vomiting, abdominal pain, diarrhea, constipation, blood in stool and melena.  Genitourinary: Negative for dysuria, urgency, frequency, hematuria and flank pain.  Musculoskeletal: Negative for myalgias, back pain, joint pain and falls.  Skin: Negative for itching and rash.  Neurological: Negative for dizziness, tingling, tremors, sensory change, speech change, focal weakness, seizures, loss of consciousness, weakness and headaches.  Endo/Heme/Allergies: Negative for environmental allergies and polydipsia. Does not bruise/bleed easily.  SUBJECTIVE: Feels well, hungry.  VITAL SIGNS:  Reviewed  PHYSICAL EXAMINATION: General:  Chronically ill appearing female, NAD. Neuro:  Alert and interactive, moving all ext to  command. HEENT:  [place]/AT, PERRL, EOM-I and MMM. Neck:  Supple, -LAN and -thyromegally. Cardiovascular:  RRR, Nl S1/S2, -M/R/G. Lungs:  Diffuse crackles in all lung fields, Velcro crackles. Abdomen:  Soft, NT, ND and +BS. Musculoskeletal:  -edema and -tenderness. Skin:  Intact.   Recent Labs Lab 08/09/13 0500 08/10/13 0600 08/12/13 0721  NA 140 139 137  K 4.5 4.0 4.6  CL 99 98 96  CO2 26 28 28   BUN 16 20 23   CREATININE 0.38* 0.44* 0.39*  GLUCOSE 205* 182* 227*    Recent Labs Lab 08/10/13 0600 08/11/13 0740 08/12/13 0721  HGB 12.2 12.7 13.3  HCT 38.6 39.8 41.8  WBC 12.9* 14.5* 15.0*  PLT 385 376 379   Dg Chest Port 1 View  08/12/2013   CLINICAL DATA:  Respiratory failure  EXAM: PORTABLE CHEST - 1 VIEW  COMPARISON:  08/10/1948  FINDINGS: Cardiac shadow is stable. Mild persistent basilar atelectatic changes are seen but significantly improved from the prior study. Mild interstitial changes are again identified and felt to be of a chronic nature. No new focal infiltrate is seen. No bony abnormality is noted. The cardiac shadow is stable. Thoracic aortic calcifications are again noted.  IMPRESSION: Persistent changes  but significantly improved from the previous exam.   Electronically Signed   By: Inez Catalina M.D.   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  Pulmonary saw in [facility] but patient was too unstable for bronch.  Patient rehabing now in [facility] and down to 4L [place].  Dr. Lamonte Sakai saw patient in [facility] and would like to perform an EBUS.  Based on that, will contact Dr. Lamonte Sakai to see when he will be comfortable doing procedure.  In the meantime, recommend keeping patient dry to avoid further pulmonary edema.  Abx as ordered.  Titrate O2 down and continue rehab efforts.  Rush Farmer, M.D. [facility]. Pager: [phone]. After hours pager: [phone].

## 2013-08-13 NOTE — Telephone Encounter (Signed)
I think this was a miscommunication. Dr Nelda Marseille saw his mom in the hospital today, sent message to me that she is probably stable for a procedure, but I haven't set anything up yet. The procedure will best be done in the OR, not in her room. Let him know that I will talk to yacoub tomorrow and clarify the plan with him.

## 2013-08-13 NOTE — Telephone Encounter (Signed)
lmomtcb x1 for son

## 2013-08-13 NOTE — Telephone Encounter (Signed)
lmomtcb x1 for [employer]

## 2013-08-14 LAB — CULTURE, BLOOD (ROUTINE X 2)
CULTURE: NO GROWTH
Culture: NO GROWTH

## 2013-08-14 NOTE — Telephone Encounter (Signed)
Pt son advised. [place] Bing, CMA

## 2013-08-14 NOTE — Telephone Encounter (Signed)
LMTCBx2 for pt son. [place] Bing, CMA

## 2013-08-15 ENCOUNTER — Other Ambulatory Visit: Payer: Self-pay | Admitting: Emergency Medicine

## 2013-08-15 DIAGNOSIS — R911 Solitary pulmonary nodule: Secondary | ICD-10-CM

## 2013-08-15 LAB — CBC
HCT: 43.5 % (ref 36.0–46.0)
Hemoglobin: 14 g/dL (ref 12.0–15.0)
MCH: 28.7 pg (ref 26.0–34.0)
MCHC: 32.2 g/dL (ref 30.0–36.0)
MCV: 89.1 fL (ref 78.0–100.0)
PLATELETS: 364 10*3/uL (ref 150–400)
RBC: 4.88 MIL/uL (ref 3.87–5.11)
RDW: 17.6 % — ABNORMAL HIGH (ref 11.5–15.5)
WBC: 20.2 10*3/uL — AB (ref 4.0–10.5)

## 2013-08-15 LAB — BASIC METABOLIC PANEL
BUN: 24 mg/dL — ABNORMAL HIGH (ref 6–23)
CHLORIDE: 94 meq/L — AB (ref 96–112)
CO2: 29 meq/L (ref 19–32)
Calcium: 9.2 mg/dL (ref 8.4–10.5)
Creatinine, Ser: 0.37 mg/dL — ABNORMAL LOW (ref 0.50–1.10)
GFR calc Af Amer: >90 mL/min (ref >90)
GFR calc non Af Amer: >90 mL/min (ref >90)
Glucose, Bld: 288 mg/dL — ABNORMAL HIGH (ref 70–99)
POTASSIUM: 4.7 meq/L (ref 3.7–5.3)
Sodium: 137 mEq/L (ref 137–147)

## 2013-08-15 LAB — PROTIME-INR
INR: 0.92 (ref 0.00–1.49)
Prothrombin Time: 12.2 seconds (ref 11.6–15.2)

## 2013-08-18 ENCOUNTER — Other Ambulatory Visit (HOSPITAL_COMMUNITY): Payer: Self-pay

## 2013-08-18 DIAGNOSIS — J9600 Acute respiratory failure, unspecified whether with hypoxia or hypercapnia: Secondary | ICD-10-CM

## 2013-08-18 DIAGNOSIS — F411 Generalized anxiety disorder: Secondary | ICD-10-CM

## 2013-08-18 DIAGNOSIS — J189 Pneumonia, unspecified organism: Secondary | ICD-10-CM

## 2013-08-18 DIAGNOSIS — C3490 Malignant neoplasm of unspecified part of unspecified bronchus or lung: Secondary | ICD-10-CM

## 2013-08-18 HISTORY — DX: Malignant neoplasm of unspecified part of unspecified bronchus or lung: C34.90

## 2013-08-18 LAB — BASIC METABOLIC PANEL
BUN: 23 mg/dL (ref 6–23)
CO2: 31 mEq/L (ref 19–32)
Calcium: 9.2 mg/dL (ref 8.4–10.5)
Chloride: 95 mEq/L — ABNORMAL LOW (ref 96–112)
Creatinine, Ser: 0.4 mg/dL — ABNORMAL LOW (ref 0.50–1.10)
GFR calc Af Amer: >90 mL/min (ref >90)
GFR calc non Af Amer: >90 mL/min (ref >90)
GLUCOSE: 149 mg/dL — AB (ref 70–99)
POTASSIUM: 4.5 meq/L (ref 3.7–5.3)
SODIUM: 137 meq/L (ref 137–147)

## 2013-08-18 LAB — CBC
HCT: 43.2 % (ref 36.0–46.0)
HEMOGLOBIN: 13.8 g/dL (ref 12.0–15.0)
MCH: 28.4 pg (ref 26.0–34.0)
MCHC: 31.9 g/dL (ref 30.0–36.0)
MCV: 88.9 fL (ref 78.0–100.0)
PLATELETS: 325 10*3/uL (ref 150–400)
RBC: 4.86 MIL/uL (ref 3.87–5.11)
RDW: 17.4 % — ABNORMAL HIGH (ref 11.5–15.5)
WBC: 15.3 10*3/uL — AB (ref 4.0–10.5)

## 2013-08-18 NOTE — Progress Notes (Signed)
   Name: Jessica Rollins MRN: [id number] DOB: 1956-04-29    ADMISSION DATE:  08/11/2013 CONSULTATION DATE:  08/13/2013  REFERRING MD :  Dr. Radene Gunning PRIMARY SERVICE:  [facility]  CHIEF COMPLAINT:  Pulmonary nodule  BRIEF PATIENT DESCRIPTION: 58 year old female with history of COPD and IPF, admitted 2/2 to [facility] with rehab after an admission in [facility] on 1/29 for CAP. Chest CT discovered multiple lung nodules suspicious for malignancy.  PCCM called to evaluate need for bronchoscopy.  LINES / TUBES: PIV  CULTURES: Per [facility]  ANTIBIOTICS: Per [facility] SUBJECTIVE: Feels well, hungry.  VITAL SIGNS: Vital signs reviewed. Abnormal values will appear under impression plan section.    PHYSICAL EXAMINATION: General:  Chronically ill appearing female, NAD. Neuro:  Alert and interactive, moving all ext to command. HEENT:  [place]/AT, PERRL, EOM-I and MMM. Neck:  Supple, large hard lymphnode suprclav on rt, nontender,  and -thyromegally. Cardiovascular:  RRR, Nl S1/S2, -M/R/G. Lungs:  Decreased bs Abdomen:  Soft, NT, ND and +BS. Musculoskeletal:  -edema and -tenderness. Skin:  Intact.   Recent Labs Lab 08/12/13 0721 08/15/13 0446 08/18/13 0742  NA 137 137 137  K 4.6 4.7 4.5  CL 96 94* 95*  CO2 28 29 31   BUN 23 24* 23  CREATININE 0.39* 0.37* 0.40*  GLUCOSE 227* 288* 149*    Recent Labs Lab 08/12/13 0721 08/15/13 0446 08/18/13 0742  HGB 13.3 14.0 13.8  HCT 41.8 43.5 43.2  WBC 15.0* 20.2* 15.3*  PLT 379 364 325   No results found.  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  likley malignancy  -on exam is a large hard lyph node, would proceed for FNA now prior to EBUS [place] td[facility] If nondiagnostic Dr Elsworth Soho will EBUS wed -follow up pcxr Consider dc all abx, s/p full course  I have fully examined this patient and agree with above findings.      Lavon Paganini. Titus Mould, MD, FACP Pgr: [place] [facility] [facility]     Richardson Landry Minor ACNP Maryanna Shape PCCM Pager [phone] till 3 pm If no answer page [phone] 08/18/2013, 1:28 PM  I have fully examined this patient and agree with above findings.    And edite dinfull  Lavon Paganini. Titus Mould, MD, [place] Pgr: [place] [facility]

## 2013-08-18 NOTE — Procedures (Signed)
Technically successful US guided biopsy of indeterminate right supraclavicular LN.  No immediate complications.

## 2013-08-18 NOTE — Progress Notes (Signed)
IR request for biopsy of Rt supraclavicular LN Has been in process of workup of pulm nodules and working towards bronchoscopy. However, palpable mass/node in Rt supraclav region correlating with findings on recent CT  Reviewed with Pulmonology and Dr. Pascal Lux. Amenable to US guided FNA/core biopsy today Lesion is superficial and should not require sedation.  Discussed procedure, risks, complications. Consent signed in chart.  Ascencion Dike PA-C Interventional Radiology 08/18/2013 1:53 PM

## 2013-08-19 ENCOUNTER — Encounter
Admission: AD | Disposition: A | Discharge status: Home / Self Care | Payer: Self-pay | Source: Ambulatory Visit | Attending: Internal Medicine

## 2013-08-19 ENCOUNTER — Ambulatory Visit: Admit: 2013-08-19 | Payer: Self-pay | Admitting: Pulmonary Disease

## 2013-08-19 SURGERY — VIDEO BRONCHOSCOPY WITH ENDOBRONCHIAL NAVIGATION
Anesthesia: General

## 2013-08-22 ENCOUNTER — Telehealth: Payer: Self-pay | Admitting: Internal Medicine

## 2013-08-22 NOTE — Telephone Encounter (Signed)
left message for Lelan Pons at [facility]  to return call to schedule np appt. [phone]

## 2013-08-25 ENCOUNTER — Telehealth: Payer: Self-pay | Admitting: Emergency Medicine

## 2013-08-25 NOTE — Telephone Encounter (Signed)
I spoke with the pt and she states Dr. Lamonte Sakai advised that she needs a PET scan after [facility]. Pt has f/u appt on 09-02-13 with RB. Please advise if PET scan is needed and we can order. Thanks. [place] Bing, CMA

## 2013-08-26 ENCOUNTER — Telehealth: Payer: Self-pay | Admitting: Internal Medicine

## 2013-08-26 NOTE — Telephone Encounter (Signed)
C/D 08/25/13 for appt. 09/01/13

## 2013-08-26 NOTE — Telephone Encounter (Signed)
S/W PT SON AND GAVE NEW PATIENT APPT FOR 02/23 @ 11 W/DR. MOHAMED.  WELCOME PACKET MAILED.

## 2013-08-27 NOTE — Telephone Encounter (Signed)
I have never met this patient, but I do know about her case. She doesn't need a PET scan from my perspective. 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 Please make sure she has an appt with Julien Nordmann. Let her know Julien Nordmann will order a PET if he feels she needs one.

## 2013-08-27 NOTE — Telephone Encounter (Signed)
Pt is aware of RB's response. She will check with Oncology and see if they feel he needs a PET scan.

## 2013-09-01 ENCOUNTER — Other Ambulatory Visit: Payer: Self-pay | Admitting: Medical Oncology

## 2013-09-01 ENCOUNTER — Ambulatory Visit: Payer: Medicare Other

## 2013-09-01 ENCOUNTER — Encounter: Payer: Self-pay | Admitting: Internal Medicine

## 2013-09-01 ENCOUNTER — Telehealth: Payer: Self-pay | Admitting: Internal Medicine

## 2013-09-01 ENCOUNTER — Ambulatory Visit (HOSPITAL_BASED_OUTPATIENT_CLINIC_OR_DEPARTMENT_OTHER): Payer: Medicare Other | Admitting: Internal Medicine

## 2013-09-01 ENCOUNTER — Other Ambulatory Visit (HOSPITAL_BASED_OUTPATIENT_CLINIC_OR_DEPARTMENT_OTHER): Payer: Medicare Other

## 2013-09-01 VITALS — BP 120/43 | HR 83 | Temp 97.9°F | Resp 18 | Ht 63.0 in | Wt 149.0 lb

## 2013-09-01 DIAGNOSIS — C3410 Malignant neoplasm of upper lobe, unspecified bronchus or lung: Secondary | ICD-10-CM

## 2013-09-01 DIAGNOSIS — M810 Age-related osteoporosis without current pathological fracture: Secondary | ICD-10-CM

## 2013-09-01 DIAGNOSIS — E119 Type 2 diabetes mellitus without complications: Secondary | ICD-10-CM

## 2013-09-01 DIAGNOSIS — J449 Chronic obstructive pulmonary disease, unspecified: Secondary | ICD-10-CM

## 2013-09-01 DIAGNOSIS — R918 Other nonspecific abnormal finding of lung field: Secondary | ICD-10-CM

## 2013-09-01 DIAGNOSIS — C3490 Malignant neoplasm of unspecified part of unspecified bronchus or lung: Secondary | ICD-10-CM | POA: Insufficient documentation

## 2013-09-01 LAB — COMPREHENSIVE METABOLIC PANEL (CC13)
ALK PHOS: 94 U/L (ref 40–150)
ALT: 17 U/L (ref 0–55)
AST: 13 U/L (ref 5–34)
Albumin: 3.1 g/dL — ABNORMAL LOW (ref 3.5–5.0)
Anion Gap: 12 mEq/L — ABNORMAL HIGH (ref 3–11)
BILIRUBIN TOTAL: 0.21 mg/dL (ref 0.20–1.20)
BUN: 12.8 mg/dL (ref 7.0–26.0)
CO2: 26 meq/L (ref 22–29)
Calcium: 9.4 mg/dL (ref 8.4–10.4)
Chloride: 104 mEq/L (ref 98–109)
Creatinine: 0.6 mg/dL (ref 0.6–1.1)
GLUCOSE: 226 mg/dL — AB (ref 70–140)
POTASSIUM: 4.2 meq/L (ref 3.5–5.1)
SODIUM: 141 meq/L (ref 136–145)
Total Protein: 6.2 g/dL — ABNORMAL LOW (ref 6.4–8.3)

## 2013-09-01 LAB — CBC WITH DIFFERENTIAL/PLATELET
BASO%: 0.5 % (ref 0.0–2.0)
Basophils Absolute: 0 10*3/uL (ref 0.0–0.1)
EOS ABS: 0.2 10*3/uL (ref 0.0–0.5)
EOS%: 2.3 % (ref 0.0–7.0)
HCT: 35.4 % (ref 34.8–46.6)
HGB: 11.7 g/dL (ref 11.6–15.9)
LYMPH%: 11.8 % — AB (ref 14.0–49.7)
MCH: 29.2 pg (ref 25.1–34.0)
MCHC: 32.9 g/dL (ref 31.5–36.0)
MCV: 88.6 fL (ref 79.5–101.0)
MONO#: 0.5 10*3/uL (ref 0.1–0.9)
MONO%: 5.2 % (ref 0.0–14.0)
NEUT%: 80.2 % — ABNORMAL HIGH (ref 38.4–76.8)
NEUTROS ABS: 8.5 10*3/uL — AB (ref 1.5–6.5)
Platelets: 304 10*3/uL (ref 145–400)
RBC: 4 10*6/uL (ref 3.70–5.45)
RDW: 19.2 % — AB (ref 11.2–14.5)
WBC: 10.6 10*3/uL — AB (ref 3.9–10.3)
lymph#: 1.2 10*3/uL (ref 0.9–3.3)

## 2013-09-01 MED ORDER — PROCHLORPERAZINE MALEATE 10 MG PO TABS: 10.0000 mg | ORAL_TABLET | Freq: Four times a day (QID) | ORAL | Status: DC | PRN

## 2013-09-01 NOTE — Progress Notes (Signed)
[place] [phone]   [phone]  CONSULT NOTE  REFERRING PHYSICIAN: Dr. Rush Farmer.  REASON FOR CONSULTATION:  58 years old white female recently diagnosed with lung cancer  HPI Jessica Rollins is a 58 y.o. female with past medical history significant for COPD, diabetes mellitus, depression, anxiety, osteoporosis, obstructive sleep apnea, pulmonary fibrosis, dyslipidemia as well as long history of smoking. The patient was admitted to [facility] on 08/07/2013 complaining of shortness of breath, cough productive of yellowish sputum. She had chest x-ray performed in 08/07/2013 and it showed findings are consistent with COPD and pulmonary fibrosis with superimposed left lower lobe pneumonia. There is no overt evidence  of CHF. There is mild soft tissue fullness in the right peritracheal region. This was followed by CT scan of the chest with contrast on the same day and it showed Mediastinal and right greater than left hilar lymphadenopathy. As index, a right paratracheal conglomerate measuring 2.9 x 3.9 cm and a right hilar node measuring 2.5 cm. 1.0 cm short axis node medial to the left common carotid artery on image 12. Left hilar nodes measure sub cm short axis however indeterminate. Upper abdominal images show a nodular liver contour which may reflect underlying cirrhosis. Small hiatal hernia. There is a 1.9 x 2.2 cm nodule within the right upper lobe anteriorly with spiculated margins. Just superior to this, there is a 0.7 cm satellite nodule. Background emphysema and peripheral reticular opacities. Patchy right upper lobe airspace opacities are nonspecific. Bilateral lower lobe consolidations. Clustered nodular opacities within the right lower lobe measuring up to 1.2 cm. Left upper lobe paramediastinal 0.9 cm nodule. Ground-glass opacity left upper lobe nodule subpleural measuring 0.7 cm. On 08/18/2013 the patient underwent ultrasound-guided  right supraclavicular lymph node biopsy by interventional radiology. The final pathology (Accession: [id number]) showed high-grade poorly differentiated neuroendocrine carcinoma, small cell type.  The patient was referred to me today for further evaluation and recommendation regarding treatment of her condition. When seen today she continues to have shortness of breath at baseline and increased with exertion and she is currently on home oxygen. She also has back pain with radiation to the front of the chest. She complains of cough productive of clear sputum but no hemoptysis. She lost around 30 pounds over the last 12 months. She has occasional headache started in November of 2014 but no visual changes. Family history significant for a father who was diagnosed with lung cancer in his 17s. The patient is single and has one son Jessica Rollins who accompanied her to the clinic today. She is currently retired and used to work and [employer] as a Freight forwarder. She has a history of smoking 2 packs per day for around 46 years and unfortunately she continues to smoke. I strongly advise him to quit smoking and offered her smoke cessation program. She has no history of alcohol drug abuse.  HPI  Past Medical History  Diagnosis Date  . COPD (chronic obstructive pulmonary disease)   . Pulmonary fibrosis     diagnosed with this 2001.    . Diabetes mellitus without complication   . Hiatal hernia   . Depression   . Anxiety   . High cholesterol   . Hypertension   . Osteoporosis   . OSA on CPAP     13cm  . Overactive bladder   . Heart murmur     Past Surgical History  Procedure Laterality Date  . Bladder suspension    .  Abdominal hysterectomy      severe bleeding-when she was in her 30's  . Lung biopsy      Family History  Problem Relation Age of Onset  . Pulmonary fibrosis Brother     Social History History  Substance Use Topics  . Smoking status: Current Every Day Smoker -- 1.50 packs/day     Types: Cigarettes    Start date: 07/11/1963  . Smokeless tobacco: Not on file  . Alcohol Use: No    No Known Allergies  Current Outpatient Prescriptions  Medication Sig Dispense Refill  . ADVAIR DISKUS 250-50 MCG/DOSE AEPB 1 puff.       Marland Kitchen albuterol (PROVENTIL) (2.5 MG/3ML) 0.083% nebulizer solution Take 3 mLs (2.5 mg total) by nebulization every 2 (two) hours as needed for wheezing or shortness of breath.  75 mL  12  . aspirin 325 MG tablet Take 325 mg by mouth daily.      Marland Kitchen azaTHIOprine (IMURAN) 50 MG tablet Take 50 mg by mouth 2 (two) times daily.      . calcium citrate-vitamin D (CITRACAL+D) 315-200 MG-UNIT per tablet Take 1 tablet by mouth daily.      . Cimetidine (TAGAMET PO) Take 1 tablet by mouth daily.      . cyanocobalamin 1000 MCG tablet Take 100 mcg by mouth daily.      Marland Kitchen esomeprazole (NEXIUM) 40 MG capsule Take 40 mg by mouth daily before breakfast.      . folic acid (FOLVITE) 1 MG tablet Take 1 mg by mouth 2 (two) times daily.      Marland Kitchen guaiFENesin (MUCINEX) 600 MG 12 hr tablet Take 600 mg by mouth 2 (two) times daily.      . IRON PO Take 1 tablet by mouth 4 (four) times daily.      Marland Kitchen LEVEMIR FLEXTOUCH 100 UNIT/ML Pen       . lisinopril (PRINIVIL,ZESTRIL) 10 MG tablet       . LORazepam (ATIVAN) 1 MG tablet Take 1 mg by mouth at bedtime.      . Multiple Vitamin (MULTIVITAMIN WITH MINERALS) TABS tablet Take 1 tablet by mouth daily.      . niacin 500 MG tablet Take 500 mg by mouth daily.      . Omega-3 Fatty Acids (FISH OIL) 300 MG CAPS Take 1 capsule by mouth daily.      Marland Kitchen oxybutynin (DITROPAN-XL) 10 MG 24 hr tablet Take 10 mg by mouth daily.      Marland Kitchen PARoxetine (PAXIL) 30 MG tablet Take 30 mg by mouth daily.      . potassium chloride (K-DUR) 10 MEQ tablet Take 10 mEq by mouth daily.      . predniSONE (DELTASONE) 10 MG tablet Take 10 mg by mouth daily with breakfast.       . rosuvastatin (CRESTOR) 10 MG tablet Take 10 mg by mouth daily.      Marland Kitchen SPIRIVA HANDIHALER 18 MCG  inhalation capsule       . vitamin E 400 UNIT capsule Take 400 Units by mouth daily.      Marland Kitchen amLODipine (NORVASC) 5 MG tablet Take 1 tablet (5 mg total) by mouth daily.      . insulin aspart (NOVOLOG) 100 UNIT/ML injection 0-15 Units, Subcutaneous, 3 times daily with meals Correction coverage: Moderate (average weight, post-op) CBG < 70: implement hypoglycemia protocol CBG 70 - 120: 0 units CBG 121 - 150: 2 units CBG 151 - 200: 3 units CBG 201 - 250:  5 units CBG 251 - 300: 8 units CBG 301 - 350: 11 units CBG 351 - 400: 15 units CBG > 400: call MD and obtain STAT lab verification  10 mL  11  . insulin glargine (LANTUS) 100 UNIT/ML injection Inject 0.05 mLs (5 Units total) into the skin daily.  10 mL  11  . metFORMIN (GLUCOPHAGE) 500 MG tablet        No current facility-administered medications for this visit.    Review of Systems  Constitutional: positive for anorexia, fatigue and weight loss Eyes: negative Ears, nose, mouth, throat, and face: negative Respiratory: positive for cough, dyspnea on exertion and sputum Cardiovascular: negative Gastrointestinal: negative Genitourinary:negative Integument/breast: negative Hematologic/lymphatic: negative Musculoskeletal:negative Neurological: negative Behavioral/Psych: negative Endocrine: negative Allergic/Immunologic: negative  Physical Exam  VQQ:VZDGL, healthy, no distress, well nourished and well developed SKIN: skin color, texture, turgor are normal, no rashes or significant lesions HEAD: Normocephalic, No masses, lesions, tenderness or abnormalities EYES: normal, PERRLA EARS: External ears normal, Canals clear OROPHARYNX:no exudate, no erythema and lips, buccal mucosa, and tongue normal  NECK: supple, no adenopathy, no JVD LYMPH:  no palpable lymphadenopathy, no hepatosplenomegaly BREAST:not examined LUNGS: expiratory wheezes bilaterally HEART: regular rate & rhythm, no murmurs and no gallops ABDOMEN:abdomen soft,  non-tender, normal bowel sounds and no masses or organomegaly BACK: Back symmetric, no curvature., No CVA tenderness EXTREMITIES:no joint deformities, effusion, or inflammation, no edema, no skin discoloration  NEURO: alert & oriented x 3 with fluent speech, no focal motor/sensory deficits  PERFORMANCE STATUS: ECOG 1  LABORATORY DATA: Lab Results  Component Value Date   WBC 10.6* 09/01/2013   HGB 11.7 09/01/2013   HCT 35.4 09/01/2013   MCV 88.6 09/01/2013   PLT 304 09/01/2013      Chemistry      Component Value Date/Time   NA 141 09/01/2013 1109   NA 137 08/18/2013 0742   K 4.2 09/01/2013 1109   K 4.5 08/18/2013 0742   CL 95* 08/18/2013 0742   CO2 26 09/01/2013 1109   CO2 31 08/18/2013 0742   BUN 12.8 09/01/2013 1109   BUN 23 08/18/2013 0742   CREATININE 0.6 09/01/2013 1109   CREATININE 0.40* 08/18/2013 0742      Component Value Date/Time   CALCIUM 9.4 09/01/2013 1109   CALCIUM 9.2 08/18/2013 0742   ALKPHOS 94 09/01/2013 1109   ALKPHOS 104 08/12/2013 0721   AST 13 09/01/2013 1109   AST 16 08/12/2013 0721   ALT 17 09/01/2013 1109   ALT 16 08/12/2013 0721   BILITOT 0.21 09/01/2013 1109   BILITOT 0.3 08/12/2013 0721       RADIOGRAPHIC STUDIES: Dg Chest 2 View  08/07/2013   CLINICAL DATA:  Cough and congestion and chest pain. History of COPD and pulmonary fibrosis and tobacco use  EXAM: CHEST  2 VIEW  COMPARISON:  DG CHEST 2 VIEW dated 12/20/2012  FINDINGS: The lungs are hyperinflated. Diffusely increased interstitial markings are present and not greatly changed. There is increased density at the left lung base posteriorly consistent with pneumonia. The cardiac silhouette is not enlarged. The pulmonary vascularity is prominent centrally but stable. There is mild soft tissue fullness in the right paratracheal region which is not clearly new. The observed portions of the bony thorax exhibit no acute abnormalities.  IMPRESSION: The findings are consistent with COPD and pulmonary fibrosis with superimposed left  lower lobe pneumonia. There is no overt evidence of CHF. There is mild soft tissue fullness in the right peritracheal region.  When the patient can tolerate the procedure, a follow-up chest CT scan is recommended to further evaluate the pulmonary parenchyma and mediastinum.   Electronically Signed   By: David  Martinique   On: 08/07/2013 15:55   Ct Chest W Contrast  08/07/2013   CLINICAL DATA:  Shortness of breath, productive cough  EXAM: CT CHEST WITH CONTRAST  TECHNIQUE: Multidetector CT imaging of the chest was performed during intravenous contrast administration.  CONTRAST:  77mL OMNIPAQUE IOHEXOL 300 MG/ML  SOLN  COMPARISON:  08/07/2013 radiograph  FINDINGS: Scattered atherosclerosis of the aorta and branch vessels. Normal caliber. Pulmonary arterial vessels are patent centrally. The bolus timing is not optimized to evaluate the more peripheral vessels. Heart size within normal limits. Coronary artery calcification.  Mediastinal and right greater than left hilar lymphadenopathy. As index, a right paratracheal conglomerate measuring 2.9 x 3.9 cm on image 19 of series 2 and a right hilar node measuring 2.5 cm on image 31. 1.0 cm short axis node medial to the left common carotid artery on image 12. Left hilar nodes measure sub cm short axis however indeterminate.  Upper abdominal images show a nodular liver contour which may reflect underlying cirrhosis. Small hiatal hernia.  There is a 1.9 x 2.2 cm nodule within the right upper lobe anteriorly on image 28 series 3 with spiculated margins. Just superior to this, there is a 0.7 cm satellite nodule on image 25. Background emphysema and peripheral reticular opacities. Patchy right upper lobe airspace opacities are nonspecific. Bilateral lower lobe consolidations. Clustered nodular opacities within the right lower lobe measuring up to 1.2 cm on image 38 of series 3. Left upper lobe paramediastinal 0.9 cm nodule on image 24. Ground-glass opacity left upper lobe nodule  subpleural measuring 0.7 cm on image 18.  The central airways are patent the some of the lower lobe bronchioles appear impacted. No pneumothorax.  No acute osseous finding.  IMPRESSION: 2.2 cm right upper lobe nodule is suspicious for a primary lung cancer. There is a 0.7 cm satellite nodule. Marked mediastinal and right hilar lymphadenopathy. Left hilar lymph nodes are indeterminate.  Bilateral lower lobe airspace opacities may reflect pneumonia or aspiration. Background emphysema and peripheral reticular opacities.  Indeterminate ground-glass opacity nodule within the left lung measuring 0.7 cm and nodular opacities within left upper lobe paramediastinal and right lower lobe, which may be infectious or malignant.  Cirrhotic liver morphology.   Electronically Signed   By: Carlos Levering M.D.   On: 08/07/2013 22:06   Ir US Guide Bx Asp/drain  08/18/2013   INDICATION: History of indeterminate right upper lobe pulmonary nodule, now with extensive mediastinal, right hilar and right supraclavicular lymphadenopathy worrisome for metastatic disease.  EXAM: IR ULTRASOUND GUIDED RIGHT SUPRACLAVICULAR LYMPH NODE BIOPSY  MEDICATIONS: None  TECHNIQUE: Informed written consent was obtained from the patient after a discussion of the risks, benefits and alternatives to treatment. Questions regarding the procedure were encouraged and answered. Initial ultrasound scanning demonstrated an enlarged approximately 2.6 x 2.5 cm hypoechoic right supraclavicular lymph node which correlates with the enlarged lymph node demonstrated on preceding chest CT and the patient's palpable area of concern. An ultrasound image was saved for documentation purposes. The procedure was planned. A timeout was performed prior to the initiation of the procedure.  The operative was prepped and draped in the usual sterile fashion, and a sterile drape was applied covering the operative field. Local anesthesia was provided with 1% lidocaine with  epinephrine.  Under direct ultrasound guidance, an  18 gauge core needle device was utilized to obtain to obtain 5 core needle biopsies of the enlarged right supraclavicular lymph node.  The samples were placed in saline and submitted to pathology. The needle was removed and hemostasis was achieved with manual compression. Post procedure scan was negative for significant hematoma. A dressing was placed. The patient tolerated the procedure well without immediate postprocedural complication.  ANESTHESIA/SEDATION: None  COMPARISON:  CT CHEST W/CM dated 01/10/5008  COMPLICATIONS: None immediate  IMPRESSION: Technically successful ultrasound guided right supraclavicular lymph node biopsy.   Electronically Signed   By: Sandi Mariscal M.D.   On: 08/18/2013 16:42   Dg Chest Port 1 View  08/12/2013   CLINICAL DATA:  Respiratory failure  EXAM: PORTABLE CHEST - 1 VIEW  COMPARISON:  08/10/1948  FINDINGS: Cardiac shadow is stable. Mild persistent basilar atelectatic changes are seen but significantly improved from the prior study. Mild interstitial changes are again identified and felt to be of a chronic nature. No new focal infiltrate is seen. No bony abnormality is noted. The cardiac shadow is stable. Thoracic aortic calcifications are again noted.  IMPRESSION: Persistent changes but significantly improved from the previous exam.   Electronically Signed   By: Inez Catalina M.D.   On: 08/12/2013 08:01   Dg Chest Port 1 View  08/10/2013   CLINICAL DATA:  Shortness of breath with exertion.  EXAM: PORTABLE CHEST - 1 VIEW  COMPARISON:  CT CHEST W/CM dated 08/07/2013; DG CHEST 2 VIEW dated 08/07/2013; DG CHEST 2 VIEW dated 12/20/2012  FINDINGS: Cardiac silhouette mildly enlarged for the AP portable technique, unchanged. Chronic interstitial pulmonary fibrosis. Chronic scarring in the lung bases, with superimposed atelectasis, with worsening aeration in the lung bases since the examination is 3 days ago. No new pulmonary parenchymal  abnormalities elsewhere.  IMPRESSION: 1. Worsening atelectasis and/or pneumonia in the lower lobes, superimposed upon chronic scarring and baseline interstitial pulmonary fibrosis. 2. Stable mild cardiomegaly without pulmonary edema.   Electronically Signed   By: Evangeline Dakin M.D.   On: 08/10/2013 09:32    ASSESSMENT: This is a very pleasant 58 years old white female recently diagnosed with extensive stage small cell lung cancer (T1b, N3, M1b), presented with bilateral pulmonary nodules in addition to mediastinal and right supraclavicular lymphadenopathy, diagnosed in February 2015   PLAN: I have a lengthy discussion with the patient and her son today about her current disease stage, prognosis and treatment options.  I will complete the staging workup by ordering a PET scan as well as MRI of the brain to rule out other metastatic disease. I discussed with the patient and her son and her treatment options including palliative care and hospice referral versus consideration of systemic chemotherapy with carboplatin for AUC of 5 on day 1 and etoposide 100 mg/M2 days 1, 2 and 3 with Neulasta support on day 4. I discussed with the patient adverse effect of the chemotherapy including but not limited to alopecia, myelosuppression, nausea and vomiting, peripheral neuropathy, liver or renal dysfunction. She would like to proceed with the chemotherapy and she is expected to start the first cycle of her treatment on 09/08/2013. I will arrange for the patient to have a chemotherapy education class before starting the first dose of her treatment. I will call her pharmacy with a prescription for Compazine 10 mg by mouth every 6 hours as needed for nausea. She would come back for followup visit in 2 weeks for reevaluation and management any adverse effect of her  chemotherapy. The patient was advised to call immediately if she has any concerning symptoms in the interval.  The patient voices understanding of  current disease status and treatment options and is in agreement with the current care plan.  All questions were answered. The patient knows to call the clinic with any problems, questions or concerns. We can certainly see the patient much sooner if necessary.  Thank you so much for allowing me to participate in the care of [facility]. I will continue to follow up the patient with you and assist in her care.  I spent 55 minutes counseling the patient face to face. The total time spent in the appointment was 70 minutes.  Disclaimer: This note was dictated with voice recognition software. Similar sounding words can inadvertently be transcribed and may not be corrected upon review.   Cynthia Stainback K. 09/01/2013, 1:01 PM

## 2013-09-01 NOTE — Progress Notes (Signed)
Checked in new patient with no financial issues. She has appt card and has not been to [place]. Her PCP is [facility] in [place],

## 2013-09-01 NOTE — Telephone Encounter (Signed)
gv pt/son appt schedule for march. [facility] will call w/pet appt - pt/son aware. i will call w/mri ([facility]) not able to complete scheduling mri due to [facility] has chart locked.

## 2013-09-01 NOTE — Patient Instructions (Signed)
Smoking Cessation, Tips for Success If you are ready to quit smoking, congratulations! You have chosen to help yourself be healthier. Cigarettes bring nicotine, tar, carbon monoxide, and other irritants into your body. Your lungs, heart, and blood vessels will be able to work better without these poisons. There are many different ways to quit smoking. Nicotine gum, nicotine patches, a nicotine inhaler, or nicotine nasal spray can help with physical craving. Hypnosis, support groups, and medicines help break the habit of smoking. WHAT THINGS CAN I DO TO MAKE QUITTING EASIER?  Here are some tips to help you quit for good:  Pick a date when you will quit smoking completely. Tell all of your friends and family about your plan to quit on that date.  Do not try to slowly cut down on the number of cigarettes you are smoking. Pick a quit date and quit smoking completely starting on that day.  Throw away all cigarettes.   Clean and remove all ashtrays from your home, work, and car.   On a card, write down your reasons for quitting. Carry the card with you and read it when you get the urge to smoke.   Cleanse your body of nicotine. Drink enough water and fluids to keep your urine clear or pale yellow. Do this after quitting to flush the nicotine from your body.   Learn to predict your moods. Do not let a bad situation be your excuse to have a cigarette. Some situations in your life might tempt you into wanting a cigarette.   Never have "just one" cigarette. It leads to wanting another and another. Remind yourself of your decision to quit.   Change habits associated with smoking. If you smoked while driving or when feeling stressed, try other activities to replace smoking. Stand up when drinking your coffee. Brush your teeth after eating. Sit in a different chair when you read the paper. Avoid alcohol while trying to quit, and try to drink fewer caffeinated beverages. Alcohol and caffeine may urge  you to smoke.   Avoid foods and drinks that can trigger a desire to smoke, such as sugary or spicy foods and alcohol.   Ask people who smoke not to smoke around you.   Have something planned to do right after eating or having a cup of coffee. For example, plan to take a walk or exercise.   Try a relaxation exercise to calm you down and decrease your stress. Remember, you may be tense and nervous for the first 2 weeks after you quit, but this will pass.   Find new activities to keep your hands busy. Play with a pen, coin, or rubber band. Doodle or draw things on paper.   Brush your teeth right after eating. This will help cut down on the craving for the taste of tobacco after meals. You can also try mouthwash.   Use oral substitutes in place of cigarettes. Try using lemon drops, carrots, cinnamon sticks, or chewing gum. Keep them handy so they are available when you have the urge to smoke.   When you have the urge to smoke, try deep breathing.   Designate your home as a nonsmoking area.   If you are a heavy smoker, ask your health care provider about a prescription for nicotine chewing gum. It can ease your withdrawal from nicotine.   Reward yourself. Set aside the cigarette money you save and buy yourself something nice.   Look for support from others. Join a support group or   smoking cessation program. Ask someone at home or at work to help you with your plan to quit smoking.   Always ask yourself, "Do I need this cigarette or is this just a reflex?" Tell yourself, "Today, I choose not to smoke," or "I do not want to smoke." You are reminding yourself of your decision to quit.  Do not replace cigarette smoking with electronic cigarettes (commonly called e-cigarettes). The safety of e-cigarettes is unknown, and some may contain harmful chemicals.  If you relapse, do not give up! Plan ahead and think about what you will do the next time you get the urge to smoke.  HOW WILL  I FEEL WHEN I QUIT SMOKING? You may have symptoms of withdrawal because your body is used to nicotine (the addictive substance in cigarettes). You may crave cigarettes, be irritable, feel very hungry, cough often, get headaches, or have difficulty concentrating. The withdrawal symptoms are only temporary. They are strongest when you first quit but will go away within 10 14 days. When withdrawal symptoms occur, stay in control. Think about your reasons for quitting. Remind yourself that these are signs that your body is healing and getting used to being without cigarettes. Remember that withdrawal symptoms are easier to treat than the major diseases that smoking can cause.  Even after the withdrawal is over, expect periodic urges to smoke. However, these cravings are generally short lived and will go away whether you smoke or not. Do not smoke!  WHAT RESOURCES ARE AVAILABLE TO HELP ME QUIT SMOKING? Your health care provider can direct you to community resources or hospitals for support, which may include:  Group support.  Education.  Hypnosis.  Therapy. Document Released: 03/24/2004 Document Revised: 04/16/2013 Document Reviewed: 12/12/2012 [employer] Patient Information 2014 [employer].  

## 2013-09-02 ENCOUNTER — Ambulatory Visit (INDEPENDENT_AMBULATORY_CARE_PROVIDER_SITE_OTHER): Payer: Medicare Other | Admitting: Emergency Medicine

## 2013-09-02 ENCOUNTER — Encounter: Payer: Self-pay | Admitting: Emergency Medicine

## 2013-09-02 ENCOUNTER — Telehealth: Payer: Self-pay | Admitting: Internal Medicine

## 2013-09-02 VITALS — BP 110/60 | HR 86 | Ht 63.0 in | Wt 150.0 lb

## 2013-09-02 DIAGNOSIS — Z9989 Dependence on other enabling machines and devices: Secondary | ICD-10-CM

## 2013-09-02 DIAGNOSIS — J449 Chronic obstructive pulmonary disease, unspecified: Secondary | ICD-10-CM

## 2013-09-02 DIAGNOSIS — G4733 Obstructive sleep apnea (adult) (pediatric): Secondary | ICD-10-CM

## 2013-09-02 DIAGNOSIS — J8410 Pulmonary fibrosis, unspecified: Secondary | ICD-10-CM

## 2013-09-02 DIAGNOSIS — C3490 Malignant neoplasm of unspecified part of unspecified bronchus or lung: Secondary | ICD-10-CM

## 2013-09-02 NOTE — Assessment & Plan Note (Signed)
-   continue CPAP qhs 

## 2013-09-02 NOTE — Assessment & Plan Note (Signed)
Dx by VATS at [facility] in 2001. Has been treated with Imuran + pred, followed by Dr Koleen Nimrod in [place] - continue this regimen for now. Some adjustments may need to be made when she is immunosuppressed on chemo. Will follow labs

## 2013-09-02 NOTE — Telephone Encounter (Signed)
lmonvm for pt re appt for mri @ [facility] ([address]) 09/04/13. pt/son was aware i would be callilng w/mri appt

## 2013-09-02 NOTE — Assessment & Plan Note (Signed)
Extensive stage. Will start chemo with Dr Julien Nordmann next week

## 2013-09-02 NOTE — Assessment & Plan Note (Signed)
-   continue O2, Spiriva, Advair, albuterol - get records from [place].  - we agreed that she would cut down cigarettes to 17 a day. We will discuss and continue to wean at next visit

## 2013-09-02 NOTE — Progress Notes (Signed)
Subjective:    Patient ID: Jessica Rollins, female    DOB: [birth year], 58 y.o.   MRN: [id number]  HPI 58 yo active smoker, hx of COPD, DM, OSA on CPAP, ILD (by VATS bx, on Imuran / Pred per Allie Dimmer). Admitted end of January '15 to [facility] for AE-COPD and LLL CAP. During that hospitalization she had CT scan that showed B nodules concerning for malignancy. There was some discussion about possible EBUS + ENB for diagnosis, but a R supraclavicular node was successfully bx'd and showed SCLCA. She was discharged from [place] on . She was seen by Dr Julien Nordmann on 2/23 and will start chemo next week. She reports that she has been coughing, has had epistaxis and has coughed up some dark old blood. She is having nausea. Has had some hypotension,    R supraclavicular nodal Bx 08/18/13 >> SCLCA  Review of Systems  Constitutional: Negative for fever and unexpected weight change.  HENT: Positive for congestion. Negative for dental problem, ear pain, nosebleeds, postnasal drip, rhinorrhea, sinus pressure, sneezing, sore throat and trouble swallowing.   Eyes: Negative for redness and itching.  Respiratory: Positive for cough, chest tightness, shortness of breath and wheezing.   Cardiovascular: Positive for leg swelling. Negative for palpitations.       Right ankle  Gastrointestinal: Negative for nausea and vomiting.  Genitourinary: Negative for dysuria.  Musculoskeletal: Negative for joint swelling.  Skin: Negative for rash.  Neurological: Positive for headaches.  Hematological: Bruises/bleeds easily.  Psychiatric/Behavioral: Positive for dysphoric mood. The patient is nervous/anxious.    Past Medical History  Diagnosis Date  . COPD (chronic obstructive pulmonary disease)   . Pulmonary fibrosis     diagnosed with this 2001.    . Diabetes mellitus without complication   . Hiatal hernia   . Depression   . Anxiety   . High cholesterol   . Hypertension   . Osteoporosis   . OSA on CPAP     13cm  .  Overactive bladder   . Heart murmur      Family History  Problem Relation Age of Onset  . Pulmonary fibrosis Brother      History   Social History  . Marital Status: Single    Spouse Name: N/A    Number of Children: N/A  . Years of Education: N/A   Occupational History  . Not on file.   Social History Main Topics  . Smoking status: Current Every Day Smoker -- 1.50 packs/day    Types: Cigarettes    Start date: 07/11/1963  . Smokeless tobacco: Not on file  . Alcohol Use: No  . Drug Use: No  . Sexual Activity: Not on file   Other Topics Concern  . Not on file   Social History Narrative   Lives in [place]   Disabled from breathing issues- and collapsed lung      No Known Allergies   Outpatient Prescriptions Prior to Visit  Medication Sig Dispense Refill  . ADVAIR DISKUS 250-50 MCG/DOSE AEPB 1 puff.       Marland Kitchen albuterol (PROVENTIL) (2.5 MG/3ML) 0.083% nebulizer solution Take 3 mLs (2.5 mg total) by nebulization every 2 (two) hours as needed for wheezing or shortness of breath.  75 mL  12  . aspirin 325 MG tablet Take 325 mg by mouth daily.      Marland Kitchen azaTHIOprine (IMURAN) 50 MG tablet Take 50 mg by mouth 2 (two) times daily.      . calcium  citrate-vitamin D (CITRACAL+D) 315-200 MG-UNIT per tablet Take 1 tablet by mouth daily.      . Cimetidine (TAGAMET PO) Take 1 tablet by mouth daily.      . cyanocobalamin 1000 MCG tablet Take 100 mcg by mouth daily.      Marland Kitchen esomeprazole (NEXIUM) 40 MG capsule Take 40 mg by mouth daily before breakfast.      . folic acid (FOLVITE) 1 MG tablet Take 1 mg by mouth 2 (two) times daily.      Marland Kitchen guaiFENesin (MUCINEX) 600 MG 12 hr tablet Take 600 mg by mouth 2 (two) times daily.      . insulin aspart (NOVOLOG) 100 UNIT/ML injection 0-15 Units, Subcutaneous, 3 times daily with meals Correction coverage: Moderate (average weight, post-op) CBG < 70: implement hypoglycemia protocol CBG 70 - 120: 0 units CBG 121 - 150: 2 units CBG 151 - 200: 3  units CBG 201 - 250: 5 units CBG 251 - 300: 8 units CBG 301 - 350: 11 units CBG 351 - 400: 15 units CBG > 400: call MD and obtain STAT lab verification  10 mL  11  . insulin glargine (LANTUS) 100 UNIT/ML injection Inject 0.05 mLs (5 Units total) into the skin daily.  10 mL  11  . IRON PO Take 1 tablet by mouth 4 (four) times daily.      Marland Kitchen LEVEMIR FLEXTOUCH 100 UNIT/ML Pen       . lisinopril (PRINIVIL,ZESTRIL) 10 MG tablet       . LORazepam (ATIVAN) 1 MG tablet Take 1 mg by mouth at bedtime.      . metFORMIN (GLUCOPHAGE) 500 MG tablet       . niacin 500 MG tablet Take 500 mg by mouth daily.      . Omega-3 Fatty Acids (FISH OIL) 300 MG CAPS Take 1 capsule by mouth daily.      Marland Kitchen oxybutynin (DITROPAN-XL) 10 MG 24 hr tablet Take 10 mg by mouth daily.      Marland Kitchen PARoxetine (PAXIL) 30 MG tablet Take 30 mg by mouth daily.      . potassium chloride (K-DUR) 10 MEQ tablet Take 10 mEq by mouth daily.      . predniSONE (DELTASONE) 10 MG tablet Take 10 mg by mouth daily with breakfast.       . prochlorperazine (COMPAZINE) 10 MG tablet Take 1 tablet (10 mg total) by mouth every 6 (six) hours as needed for nausea or vomiting.  60 tablet  0  . rosuvastatin (CRESTOR) 10 MG tablet Take 10 mg by mouth daily.      Marland Kitchen SPIRIVA HANDIHALER 18 MCG inhalation capsule       . vitamin E 400 UNIT capsule Take 400 Units by mouth daily.      Marland Kitchen amLODipine (NORVASC) 5 MG tablet Take 1 tablet (5 mg total) by mouth daily.      . Multiple Vitamin (MULTIVITAMIN WITH MINERALS) TABS tablet Take 1 tablet by mouth daily.       No facility-administered medications prior to visit.         Objective:   Physical Exam Filed Vitals:   09/02/13 1105  BP: 110/60  Pulse: 86  Height: 5\' 3"  (1.6 m)  Weight: 150 lb (68.04 kg)  SpO2: 93%   Gen: Pleasant, well-nourished, in no distress,  normal affect  ENT: No lesions,  mouth clear,  oropharynx clear, no postnasal drip  Neck: No JVD, no TMG, no carotid bruits  Lungs: No  use of  accessory muscles, no dullness to percussion, clear without rales or rhonchi  Cardiovascular: RRR, heart sounds normal, no murmur or gallops, no peripheral edema  Musculoskeletal: No deformities, no cyanosis or clubbing  Neuro: alert, non focal  Skin: Warm, no lesions or rashes     CT chest 08/07/13 --  COMPARISON: 08/07/2013 radiograph  FINDINGS:  Scattered atherosclerosis of the aorta and branch vessels. Normal  caliber. Pulmonary arterial vessels are patent centrally. The bolus  timing is not optimized to evaluate the more peripheral vessels.  Heart size within normal limits. Coronary artery calcification.  Mediastinal and right greater than left hilar lymphadenopathy. As  index, a right paratracheal conglomerate measuring 2.9 x 3.9 cm on  image 19 of series 2 and a right hilar node measuring 2.5 cm on  image 31. 1.0 cm short axis node medial to the left common carotid  artery on image 12. Left hilar nodes measure sub cm short axis  however indeterminate.  Upper abdominal images show a nodular liver contour which may  reflect underlying cirrhosis. Small hiatal hernia.  There is a 1.9 x 2.2 cm nodule within the right upper lobe  anteriorly on image 28 series 3 with spiculated margins. Just  superior to this, there is a 0.7 cm satellite nodule on image 25.  Background emphysema and peripheral reticular opacities. Patchy  right upper lobe airspace opacities are nonspecific. Bilateral lower  lobe consolidations. Clustered nodular opacities within the right  lower lobe measuring up to 1.2 cm on image 38 of series 3. Left  upper lobe paramediastinal 0.9 cm nodule on image 24. Ground-glass  opacity left upper lobe nodule subpleural measuring 0.7 cm on image  18.  The central airways are patent the some of the lower lobe  bronchioles appear impacted. No pneumothorax.  No acute osseous finding.  IMPRESSION:  2.2 cm right upper lobe nodule is suspicious for a primary lung  cancer.  There is a 0.7 cm satellite nodule. Marked mediastinal and  right hilar lymphadenopathy. Left hilar lymph nodes are  indeterminate.  Bilateral lower lobe airspace opacities may reflect pneumonia or  aspiration. Background emphysema and peripheral reticular opacities.  Indeterminate ground-glass opacity nodule within the left lung  measuring 0.7 cm and nodular opacities within left upper lobe  paramediastinal and right lower lobe, which may be infectious or  malignant      Assessment & Plan:  Pulmonary fibrosis Dx by VATS at [facility] in 2001. Has been treated with Imuran + pred, followed by Dr Koleen Nimrod in [place] - continue this regimen for now. Some adjustments may need to be made when she is immunosuppressed on chemo. Will follow labs   OSA on CPAP - continue CPAP qhs  Small cell lung carcinoma Extensive stage. Will start chemo with Dr Julien Nordmann next week   COPD (chronic obstructive pulmonary disease) - continue O2, Spiriva, Advair, albuterol - get records from [place].  - we agreed that she would cut down cigarettes to 17 a day. We will discuss and continue to wean at next visit

## 2013-09-02 NOTE — Patient Instructions (Signed)
We will get your Pulmonary Function Tests and CT scans from Dr Koleen Nimrod in [place] We have agreed that you will cut your cigarettes down to 17 cigarettes a day Please continue your Advair and Spiriva  Continue Imuran and Prednisone Continue your oxygen at 3.5L/min Wear your CPAP every night Follow with Dr Julien Nordmann as planned Follow with Dr Lamonte Sakai in 1 month or sooner if you have any problems.

## 2013-09-03 ENCOUNTER — Other Ambulatory Visit: Payer: Medicare Other

## 2013-09-03 ENCOUNTER — Ambulatory Visit
Admission: RE | Admit: 2013-09-03 | Discharge: 2013-09-03 | Disposition: A | Discharge status: Home / Self Care | Payer: Medicare Other | Source: Ambulatory Visit | Attending: Internal Medicine | Admitting: Internal Medicine

## 2013-09-03 DIAGNOSIS — C3490 Malignant neoplasm of unspecified part of unspecified bronchus or lung: Secondary | ICD-10-CM

## 2013-09-03 MED ORDER — GADOBENATE DIMEGLUMINE 529 MG/ML IV SOLN
14.0000 mL | Freq: Once | INTRAVENOUS | Status: AC | PRN
  Administered 2013-09-03: 14 mL via INTRAVENOUS

## 2013-09-04 ENCOUNTER — Inpatient Hospital Stay: Admission: RE | Admit: 2013-09-04 | Payer: Medicare Other | Source: Ambulatory Visit

## 2013-09-08 ENCOUNTER — Telehealth: Payer: Self-pay | Admitting: Medical Oncology

## 2013-09-08 ENCOUNTER — Encounter: Payer: Self-pay | Admitting: *Deleted

## 2013-09-08 ENCOUNTER — Other Ambulatory Visit (HOSPITAL_BASED_OUTPATIENT_CLINIC_OR_DEPARTMENT_OTHER): Payer: Medicare Other

## 2013-09-08 ENCOUNTER — Ambulatory Visit (HOSPITAL_BASED_OUTPATIENT_CLINIC_OR_DEPARTMENT_OTHER): Payer: Medicare Other

## 2013-09-08 VITALS — BP 101/64 | HR 74 | Temp 98.4°F | Resp 18

## 2013-09-08 DIAGNOSIS — Z5111 Encounter for antineoplastic chemotherapy: Secondary | ICD-10-CM

## 2013-09-08 DIAGNOSIS — C3410 Malignant neoplasm of upper lobe, unspecified bronchus or lung: Secondary | ICD-10-CM

## 2013-09-08 DIAGNOSIS — C3490 Malignant neoplasm of unspecified part of unspecified bronchus or lung: Secondary | ICD-10-CM

## 2013-09-08 LAB — CBC WITH DIFFERENTIAL/PLATELET
BASO%: 0.3 % (ref 0.0–2.0)
Basophils Absolute: 0 10*3/uL (ref 0.0–0.1)
EOS%: 1.4 % (ref 0.0–7.0)
Eosinophils Absolute: 0.1 10*3/uL (ref 0.0–0.5)
HEMATOCRIT: 37.4 % (ref 34.8–46.6)
HGB: 11.9 g/dL (ref 11.6–15.9)
LYMPH%: 7.8 % — AB (ref 14.0–49.7)
MCH: 28.4 pg (ref 25.1–34.0)
MCHC: 31.8 g/dL (ref 31.5–36.0)
MCV: 89.3 fL (ref 79.5–101.0)
MONO#: 0.4 10*3/uL (ref 0.1–0.9)
MONO%: 5.3 % (ref 0.0–14.0)
NEUT#: 6.5 10*3/uL (ref 1.5–6.5)
NEUT%: 85.2 % — AB (ref 38.4–76.8)
NRBC: 0 % (ref 0–0)
PLATELETS: 293 10*3/uL (ref 145–400)
RBC: 4.19 10*6/uL (ref 3.70–5.45)
RDW: 19.1 % — ABNORMAL HIGH (ref 11.2–14.5)
WBC: 7.7 10*3/uL (ref 3.9–10.3)
lymph#: 0.6 10*3/uL — ABNORMAL LOW (ref 0.9–3.3)

## 2013-09-08 LAB — COMPREHENSIVE METABOLIC PANEL (CC13)
ALT: 16 U/L (ref 0–55)
AST: 14 U/L (ref 5–34)
Albumin: 3.3 g/dL — ABNORMAL LOW (ref 3.5–5.0)
Alkaline Phosphatase: 114 U/L (ref 40–150)
Anion Gap: 8 mEq/L (ref 3–11)
BILIRUBIN TOTAL: 0.21 mg/dL (ref 0.20–1.20)
BUN: 12.8 mg/dL (ref 7.0–26.0)
CO2: 25 mEq/L (ref 22–29)
CREATININE: 0.6 mg/dL (ref 0.6–1.1)
Calcium: 9.6 mg/dL (ref 8.4–10.4)
Chloride: 105 mEq/L (ref 98–109)
Glucose: 265 mg/dl — ABNORMAL HIGH (ref 70–140)
Potassium: 4.5 mEq/L (ref 3.5–5.1)
Sodium: 138 mEq/L (ref 136–145)
Total Protein: 6.5 g/dL (ref 6.4–8.3)

## 2013-09-08 MED ORDER — DEXAMETHASONE SODIUM PHOSPHATE 20 MG/5ML IJ SOLN
INTRAMUSCULAR | Status: AC
  Filled 2013-09-08: qty 5

## 2013-09-08 MED ORDER — ONDANSETRON 16 MG/50ML IVPB (CHCC)
16.0000 mg | Freq: Once | INTRAVENOUS | Status: AC
  Administered 2013-09-08: 16 mg via INTRAVENOUS

## 2013-09-08 MED ORDER — SODIUM CHLORIDE 0.9 % IV SOLN
670.0000 mg | Freq: Once | INTRAVENOUS | Status: AC
  Administered 2013-09-08: 670 mg via INTRAVENOUS
  Filled 2013-09-08: qty 67

## 2013-09-08 MED ORDER — DEXAMETHASONE SODIUM PHOSPHATE 20 MG/5ML IJ SOLN
20.0000 mg | Freq: Once | INTRAMUSCULAR | Status: AC
  Administered 2013-09-08: 20 mg via INTRAVENOUS

## 2013-09-08 MED ORDER — ONDANSETRON 16 MG/50ML IVPB (CHCC)
INTRAVENOUS | Status: AC
  Filled 2013-09-08: qty 16

## 2013-09-08 MED ORDER — SODIUM CHLORIDE 0.9 % IV SOLN
100.0000 mg/m2 | Freq: Once | INTRAVENOUS | Status: AC
  Administered 2013-09-08: 170 mg via INTRAVENOUS
  Filled 2013-09-08: qty 8.5

## 2013-09-08 MED ORDER — SODIUM CHLORIDE 0.9 % IV SOLN
Freq: Once | INTRAVENOUS | Status: AC
  Administered 2013-09-08: 14:00:00 via INTRAVENOUS

## 2013-09-08 NOTE — Telephone Encounter (Signed)
erroneous

## 2013-09-08 NOTE — Progress Notes (Signed)
Spoke with Jessica Rollins Friday regarding pt distress screening.  Pt had a high distress level.  Social worker was notified.  I spoke with pt today in chemo room.  Pt was a little sleepy due to medication and did not mention any distress at this time.  Family/friend with patient.

## 2013-09-08 NOTE — Patient Instructions (Signed)
[facility] Discharge Instructions for Patients Receiving Chemotherapy  Today you received the following chemotherapy agents: Carboplatin, Etoposide  To help prevent nausea and vomiting after your treatment, we encourage you to take your nausea medication as prescribed.  If you develop nausea and vomiting that is not controlled by your nausea medication, call the clinic.   BELOW ARE SYMPTOMS THAT SHOULD BE REPORTED IMMEDIATELY:  *FEVER GREATER THAN 100.5 F  *CHILLS WITH OR WITHOUT FEVER  NAUSEA AND VOMITING THAT IS NOT CONTROLLED WITH YOUR NAUSEA MEDICATION  *UNUSUAL SHORTNESS OF BREATH  *UNUSUAL BRUISING OR BLEEDING  TENDERNESS IN MOUTH AND THROAT WITH OR WITHOUT PRESENCE OF ULCERS  *URINARY PROBLEMS  *BOWEL PROBLEMS  UNUSUAL RASH Items with * indicate a potential emergency and should be followed up as soon as possible.  Feel free to call the clinic you have any questions or concerns. The clinic phone number is [phone].

## 2013-09-09 ENCOUNTER — Ambulatory Visit (HOSPITAL_BASED_OUTPATIENT_CLINIC_OR_DEPARTMENT_OTHER): Payer: Medicare Other

## 2013-09-09 VITALS — BP 126/63 | HR 76 | Temp 97.0°F | Resp 20

## 2013-09-09 DIAGNOSIS — C3410 Malignant neoplasm of upper lobe, unspecified bronchus or lung: Secondary | ICD-10-CM

## 2013-09-09 DIAGNOSIS — Z5111 Encounter for antineoplastic chemotherapy: Secondary | ICD-10-CM

## 2013-09-09 DIAGNOSIS — C3490 Malignant neoplasm of unspecified part of unspecified bronchus or lung: Secondary | ICD-10-CM

## 2013-09-09 MED ORDER — PROCHLORPERAZINE MALEATE 10 MG PO TABS
ORAL_TABLET | ORAL | Status: AC
  Filled 2013-09-09: qty 1

## 2013-09-09 MED ORDER — SODIUM CHLORIDE 0.9 % IV SOLN
Freq: Once | INTRAVENOUS | Status: AC
  Administered 2013-09-09: 15:00:00 via INTRAVENOUS

## 2013-09-09 MED ORDER — PROCHLORPERAZINE MALEATE 10 MG PO TABS
10.0000 mg | ORAL_TABLET | Freq: Once | ORAL | Status: AC
  Administered 2013-09-09: 10 mg via ORAL

## 2013-09-09 MED ORDER — SODIUM CHLORIDE 0.9 % IV SOLN
100.0000 mg/m2 | Freq: Once | INTRAVENOUS | Status: AC
  Administered 2013-09-09: 170 mg via INTRAVENOUS
  Filled 2013-09-09: qty 8.5

## 2013-09-10 ENCOUNTER — Encounter (HOSPITAL_COMMUNITY)
Admission: RE | Admit: 2013-09-10 | Discharge: 2013-09-10 | Disposition: A | Discharge status: Home / Self Care | Payer: Medicare Other | Source: Ambulatory Visit | Attending: Internal Medicine | Admitting: Internal Medicine

## 2013-09-10 ENCOUNTER — Ambulatory Visit (HOSPITAL_BASED_OUTPATIENT_CLINIC_OR_DEPARTMENT_OTHER): Payer: Medicare Other

## 2013-09-10 VITALS — BP 114/48 | HR 82 | Temp 98.2°F | Resp 20

## 2013-09-10 DIAGNOSIS — J8410 Pulmonary fibrosis, unspecified: Secondary | ICD-10-CM | POA: Insufficient documentation

## 2013-09-10 DIAGNOSIS — C7952 Secondary malignant neoplasm of bone marrow: Secondary | ICD-10-CM

## 2013-09-10 DIAGNOSIS — Z90710 Acquired absence of both cervix and uterus: Secondary | ICD-10-CM | POA: Insufficient documentation

## 2013-09-10 DIAGNOSIS — K449 Diaphragmatic hernia without obstruction or gangrene: Secondary | ICD-10-CM | POA: Insufficient documentation

## 2013-09-10 DIAGNOSIS — J984 Other disorders of lung: Secondary | ICD-10-CM | POA: Insufficient documentation

## 2013-09-10 DIAGNOSIS — C3490 Malignant neoplasm of unspecified part of unspecified bronchus or lung: Secondary | ICD-10-CM

## 2013-09-10 DIAGNOSIS — C3410 Malignant neoplasm of upper lobe, unspecified bronchus or lung: Secondary | ICD-10-CM

## 2013-09-10 DIAGNOSIS — K7460 Unspecified cirrhosis of liver: Secondary | ICD-10-CM | POA: Insufficient documentation

## 2013-09-10 DIAGNOSIS — C7951 Secondary malignant neoplasm of bone: Secondary | ICD-10-CM | POA: Insufficient documentation

## 2013-09-10 DIAGNOSIS — R599 Enlarged lymph nodes, unspecified: Secondary | ICD-10-CM | POA: Insufficient documentation

## 2013-09-10 DIAGNOSIS — Z5111 Encounter for antineoplastic chemotherapy: Secondary | ICD-10-CM

## 2013-09-10 DIAGNOSIS — I2510 Atherosclerotic heart disease of native coronary artery without angina pectoris: Secondary | ICD-10-CM | POA: Insufficient documentation

## 2013-09-10 LAB — GLUCOSE, CAPILLARY: Glucose-Capillary: 145 mg/dL — ABNORMAL HIGH (ref 70–99)

## 2013-09-10 MED ORDER — SODIUM CHLORIDE 0.9 % IV SOLN
Freq: Once | INTRAVENOUS | Status: AC
  Administered 2013-09-10: 14:00:00 via INTRAVENOUS

## 2013-09-10 MED ORDER — SODIUM CHLORIDE 0.9 % IV SOLN
100.0000 mg/m2 | Freq: Once | INTRAVENOUS | Status: AC
  Administered 2013-09-10: 170 mg via INTRAVENOUS
  Filled 2013-09-10: qty 8.5

## 2013-09-10 MED ORDER — PROCHLORPERAZINE MALEATE 10 MG PO TABS
10.0000 mg | ORAL_TABLET | Freq: Once | ORAL | Status: AC
  Administered 2013-09-10: 10 mg via ORAL

## 2013-09-10 MED ORDER — PROCHLORPERAZINE MALEATE 10 MG PO TABS
ORAL_TABLET | ORAL | Status: AC
  Filled 2013-09-10: qty 1

## 2013-09-10 MED ORDER — FLUDEOXYGLUCOSE F - 18 (FDG) INJECTION
9.1000 | Freq: Once | INTRAVENOUS | Status: AC | PRN
  Administered 2013-09-10: 9.1 via INTRAVENOUS

## 2013-09-10 NOTE — Patient Instructions (Signed)
[facility] Discharge Instructions for Patients Receiving Chemotherapy  Today you received the following chemotherapy agent: Etoposide   To help prevent nausea and vomiting after your treatment, we encourage you to take your nausea medication as prescribed.    If you develop nausea and vomiting that is not controlled by your nausea medication, call the clinic.   BELOW ARE SYMPTOMS THAT SHOULD BE REPORTED IMMEDIATELY:  *FEVER GREATER THAN 100.5 F  *CHILLS WITH OR WITHOUT FEVER  NAUSEA AND VOMITING THAT IS NOT CONTROLLED WITH YOUR NAUSEA MEDICATION  *UNUSUAL SHORTNESS OF BREATH  *UNUSUAL BRUISING OR BLEEDING  TENDERNESS IN MOUTH AND THROAT WITH OR WITHOUT PRESENCE OF ULCERS  *URINARY PROBLEMS  *BOWEL PROBLEMS  UNUSUAL RASH Items with * indicate a potential emergency and should be followed up as soon as possible.  Feel free to call the clinic you have any questions or concerns. The clinic phone number is [phone].

## 2013-09-11 ENCOUNTER — Telehealth: Payer: Self-pay | Admitting: *Deleted

## 2013-09-11 ENCOUNTER — Ambulatory Visit (HOSPITAL_BASED_OUTPATIENT_CLINIC_OR_DEPARTMENT_OTHER): Payer: Medicare Other

## 2013-09-11 VITALS — BP 98/50 | HR 84 | Temp 98.3°F

## 2013-09-11 DIAGNOSIS — C3410 Malignant neoplasm of upper lobe, unspecified bronchus or lung: Secondary | ICD-10-CM

## 2013-09-11 DIAGNOSIS — Z5189 Encounter for other specified aftercare: Secondary | ICD-10-CM

## 2013-09-11 DIAGNOSIS — C3490 Malignant neoplasm of unspecified part of unspecified bronchus or lung: Secondary | ICD-10-CM

## 2013-09-11 MED ORDER — PEGFILGRASTIM INJECTION 6 MG/0.6ML
6.0000 mg | Freq: Once | SUBCUTANEOUS | Status: AC
  Administered 2013-09-11: 6 mg via SUBCUTANEOUS
  Filled 2013-09-11: qty 0.6

## 2013-09-11 NOTE — Telephone Encounter (Signed)
Jessica Rollins here for Neulasta injection following 1st carbo/vp chemo treatment   States that she is doing well.  No nausea, vomiting, or diarrhea.  Is drinking her fluids and eating as tolerated.  All questions answered  Knows to call if she has any problems or concerns.

## 2013-09-11 NOTE — Patient Instructions (Signed)

## 2013-09-11 NOTE — Telephone Encounter (Signed)
Called pt to check on her.  I listened as she stated she was feeling good and doing well.  I let her know the [facility] is her to help if needed.

## 2013-09-15 ENCOUNTER — Encounter: Payer: Self-pay | Admitting: Internal Medicine

## 2013-09-15 ENCOUNTER — Other Ambulatory Visit (HOSPITAL_BASED_OUTPATIENT_CLINIC_OR_DEPARTMENT_OTHER): Payer: Medicare Other

## 2013-09-15 ENCOUNTER — Ambulatory Visit (HOSPITAL_BASED_OUTPATIENT_CLINIC_OR_DEPARTMENT_OTHER): Payer: Medicare Other | Admitting: Internal Medicine

## 2013-09-15 VITALS — BP 114/50 | HR 78 | Temp 97.5°F | Resp 18 | Ht 63.0 in | Wt 153.2 lb

## 2013-09-15 DIAGNOSIS — C7951 Secondary malignant neoplasm of bone: Secondary | ICD-10-CM

## 2013-09-15 DIAGNOSIS — C7952 Secondary malignant neoplasm of bone marrow: Secondary | ICD-10-CM

## 2013-09-15 DIAGNOSIS — C3490 Malignant neoplasm of unspecified part of unspecified bronchus or lung: Secondary | ICD-10-CM

## 2013-09-15 DIAGNOSIS — C3410 Malignant neoplasm of upper lobe, unspecified bronchus or lung: Secondary | ICD-10-CM

## 2013-09-15 DIAGNOSIS — C771 Secondary and unspecified malignant neoplasm of intrathoracic lymph nodes: Secondary | ICD-10-CM

## 2013-09-15 LAB — COMPREHENSIVE METABOLIC PANEL (CC13)
ALT: 21 U/L (ref 0–55)
AST: 14 U/L (ref 5–34)
Albumin: 3.3 g/dL — ABNORMAL LOW (ref 3.5–5.0)
Alkaline Phosphatase: 171 U/L — ABNORMAL HIGH (ref 40–150)
Anion Gap: 7 mEq/L (ref 3–11)
BILIRUBIN TOTAL: 0.2 mg/dL (ref 0.20–1.20)
BUN: 11.9 mg/dL (ref 7.0–26.0)
CO2: 28 mEq/L (ref 22–29)
Calcium: 9.6 mg/dL (ref 8.4–10.4)
Chloride: 104 mEq/L (ref 98–109)
Creatinine: 0.6 mg/dL (ref 0.6–1.1)
Glucose: 253 mg/dl — ABNORMAL HIGH (ref 70–140)
Potassium: 4.7 mEq/L (ref 3.5–5.1)
SODIUM: 139 meq/L (ref 136–145)
TOTAL PROTEIN: 6.5 g/dL (ref 6.4–8.3)

## 2013-09-15 LAB — CBC WITH DIFFERENTIAL/PLATELET
BASO%: 0.5 % (ref 0.0–2.0)
Basophils Absolute: 0 10*3/uL (ref 0.0–0.1)
EOS%: 0.8 % (ref 0.0–7.0)
Eosinophils Absolute: 0.1 10*3/uL (ref 0.0–0.5)
HEMATOCRIT: 34.4 % — AB (ref 34.8–46.6)
HGB: 11.2 g/dL — ABNORMAL LOW (ref 11.6–15.9)
LYMPH%: 9.3 % — AB (ref 14.0–49.7)
MCH: 28.7 pg (ref 25.1–34.0)
MCHC: 32.5 g/dL (ref 31.5–36.0)
MCV: 88.1 fL (ref 79.5–101.0)
MONO#: 0.4 10*3/uL (ref 0.1–0.9)
MONO%: 4.7 % (ref 0.0–14.0)
NEUT#: 7.6 10*3/uL — ABNORMAL HIGH (ref 1.5–6.5)
NEUT%: 84.7 % — AB (ref 38.4–76.8)
Platelets: 396 10*3/uL (ref 145–400)
RBC: 3.9 10*6/uL (ref 3.70–5.45)
RDW: 19.2 % — ABNORMAL HIGH (ref 11.2–14.5)
WBC: 9 10*3/uL (ref 3.9–10.3)
lymph#: 0.8 10*3/uL — ABNORMAL LOW (ref 0.9–3.3)

## 2013-09-15 NOTE — Patient Instructions (Signed)
Smoking Cessation, Tips for Success If you are ready to quit smoking, congratulations! You have chosen to help yourself be healthier. Cigarettes bring nicotine, tar, carbon monoxide, and other irritants into your body. Your lungs, heart, and blood vessels will be able to work better without these poisons. There are many different ways to quit smoking. Nicotine gum, nicotine patches, a nicotine inhaler, or nicotine nasal spray can help with physical craving. Hypnosis, support groups, and medicines help break the habit of smoking. WHAT THINGS CAN I DO TO MAKE QUITTING EASIER?  Here are some tips to help you quit for good:  Pick a date when you will quit smoking completely. Tell all of your friends and family about your plan to quit on that date.  Do not try to slowly cut down on the number of cigarettes you are smoking. Pick a quit date and quit smoking completely starting on that day.  Throw away all cigarettes.   Clean and remove all ashtrays from your home, work, and car.   On a card, write down your reasons for quitting. Carry the card with you and read it when you get the urge to smoke.   Cleanse your body of nicotine. Drink enough water and fluids to keep your urine clear or pale yellow. Do this after quitting to flush the nicotine from your body.   Learn to predict your moods. Do not let a bad situation be your excuse to have a cigarette. Some situations in your life might tempt you into wanting a cigarette.   Never have "just one" cigarette. It leads to wanting another and another. Remind yourself of your decision to quit.   Change habits associated with smoking. If you smoked while driving or when feeling stressed, try other activities to replace smoking. Stand up when drinking your coffee. Brush your teeth after eating. Sit in a different chair when you read the paper. Avoid alcohol while trying to quit, and try to drink fewer caffeinated beverages. Alcohol and caffeine may urge  you to smoke.   Avoid foods and drinks that can trigger a desire to smoke, such as sugary or spicy foods and alcohol.   Ask people who smoke not to smoke around you.   Have something planned to do right after eating or having a cup of coffee. For example, plan to take a walk or exercise.   Try a relaxation exercise to calm you down and decrease your stress. Remember, you may be tense and nervous for the first 2 weeks after you quit, but this will pass.   Find new activities to keep your hands busy. Play with a pen, coin, or rubber band. Doodle or draw things on paper.   Brush your teeth right after eating. This will help cut down on the craving for the taste of tobacco after meals. You can also try mouthwash.   Use oral substitutes in place of cigarettes. Try using lemon drops, carrots, cinnamon sticks, or chewing gum. Keep them handy so they are available when you have the urge to smoke.   When you have the urge to smoke, try deep breathing.   Designate your home as a nonsmoking area.   If you are a heavy smoker, ask your health care provider about a prescription for nicotine chewing gum. It can ease your withdrawal from nicotine.   Reward yourself. Set aside the cigarette money you save and buy yourself something nice.   Look for support from others. Join a support group or   smoking cessation program. Ask someone at home or at work to help you with your plan to quit smoking.   Always ask yourself, "Do I need this cigarette or is this just a reflex?" Tell yourself, "Today, I choose not to smoke," or "I do not want to smoke." You are reminding yourself of your decision to quit.  Do not replace cigarette smoking with electronic cigarettes (commonly called e-cigarettes). The safety of e-cigarettes is unknown, and some may contain harmful chemicals.  If you relapse, do not give up! Plan ahead and think about what you will do the next time you get the urge to smoke.  HOW WILL  I FEEL WHEN I QUIT SMOKING? You may have symptoms of withdrawal because your body is used to nicotine (the addictive substance in cigarettes). You may crave cigarettes, be irritable, feel very hungry, cough often, get headaches, or have difficulty concentrating. The withdrawal symptoms are only temporary. They are strongest when you first quit but will go away within 10 14 days. When withdrawal symptoms occur, stay in control. Think about your reasons for quitting. Remind yourself that these are signs that your body is healing and getting used to being without cigarettes. Remember that withdrawal symptoms are easier to treat than the major diseases that smoking can cause.  Even after the withdrawal is over, expect periodic urges to smoke. However, these cravings are generally short lived and will go away whether you smoke or not. Do not smoke!  WHAT RESOURCES ARE AVAILABLE TO HELP ME QUIT SMOKING? Your health care provider can direct you to community resources or hospitals for support, which may include:  Group support.  Education.  Hypnosis.  Therapy. Document Released: 03/24/2004 Document Revised: 04/16/2013 Document Reviewed: 12/12/2012 [employer] Patient Information 2014 [employer].  

## 2013-09-15 NOTE — Progress Notes (Signed)
[facility] [phone]   [phone]  OFFICE PROGRESS NOTE  [name]., MD [address] [place] [address]  DIAGNOSIS: Extensive stage small cell lung cancer diagnosed in February of 2015.  PRIOR THERAPY: None  CURRENT THERAPY: Systemic chemotherapy with carboplatin for AUC of 5 on day 1 and etoposide 100 mg/M2 on days 1, 2 and 3 with Neulasta support on day 4. She is status post 1 cycle.   INTERVAL HISTORY: Jessica Rollins 58 y.o. female returns to the clinic today for followup visit accompanied by her son. The patient tolerated the first week of her systemic chemotherapy fairly well with no significant adverse effects. She denied having any significant fever or chills. She denied having any chest pain but continues to have the baseline shortness of breath and she is currently on home oxygen, no cough or hemoptysis. She has no significant weight loss or night sweats. She had MRI of the brain as well as PET scan performed recently and she is here for evaluation and discussion of her imaging studies.  MEDICAL HISTORY: Past Medical History  Diagnosis Date  . COPD (chronic obstructive pulmonary disease)   . Pulmonary fibrosis     diagnosed with this 2001.    . Diabetes mellitus without complication   . Hiatal hernia   . Depression   . Anxiety   . High cholesterol   . Hypertension   . Osteoporosis   . OSA on CPAP     13cm  . Overactive bladder   . Heart murmur     ALLERGIES:  has No Known Allergies.  MEDICATIONS:  Current Outpatient Prescriptions  Medication Sig Dispense Refill  . ADVAIR DISKUS 250-50 MCG/DOSE AEPB 1 puff.       Marland Kitchen albuterol (PROVENTIL) (2.5 MG/3ML) 0.083% nebulizer solution Take 3 mLs (2.5 mg total) by nebulization every 2 (two) hours as needed for wheezing or shortness of breath.  75 mL  12  . amLODipine (NORVASC) 5 MG tablet Take 1 tablet (5 mg total) by mouth daily.      Marland Kitchen aspirin 325 MG tablet  Take 325 mg by mouth daily.      Marland Kitchen azaTHIOprine (IMURAN) 50 MG tablet Take 50 mg by mouth 2 (two) times daily.      . calcium citrate-vitamin D (CITRACAL+D) 315-200 MG-UNIT per tablet Take 1 tablet by mouth daily.      . Cimetidine (TAGAMET PO) Take 1 tablet by mouth daily.      . cyanocobalamin 1000 MCG tablet Take 100 mcg by mouth daily.      Marland Kitchen esomeprazole (NEXIUM) 40 MG capsule Take 40 mg by mouth daily before breakfast.      . folic acid (FOLVITE) 1 MG tablet Take 1 mg by mouth 2 (two) times daily.      Marland Kitchen guaiFENesin (MUCINEX) 600 MG 12 hr tablet Take 600 mg by mouth 2 (two) times daily.      . insulin aspart (NOVOLOG) 100 UNIT/ML injection 0-15 Units, Subcutaneous, 3 times daily with meals Correction coverage: Moderate (average weight, post-op) CBG < 70: implement hypoglycemia protocol CBG 70 - 120: 0 units CBG 121 - 150: 2 units CBG 151 - 200: 3 units CBG 201 - 250: 5 units CBG 251 - 300: 8 units CBG 301 - 350: 11 units CBG 351 - 400: 15 units CBG > 400: call MD and obtain STAT lab verification  10 mL  11  . insulin  glargine (LANTUS) 100 UNIT/ML injection Inject 0.05 mLs (5 Units total) into the skin daily.  10 mL  11  . IRON PO Take 1 tablet by mouth 4 (four) times daily.      Marland Kitchen LEVEMIR FLEXTOUCH 100 UNIT/ML Pen       . lisinopril (PRINIVIL,ZESTRIL) 10 MG tablet       . LORazepam (ATIVAN) 1 MG tablet Take 1 mg by mouth at bedtime.      . metFORMIN (GLUCOPHAGE) 500 MG tablet       . Multiple Vitamin (MULTIVITAMIN WITH MINERALS) TABS tablet Take 1 tablet by mouth daily.      . niacin 500 MG tablet Take 500 mg by mouth daily.      . Omega-3 Fatty Acids (FISH OIL) 300 MG CAPS Take 1 capsule by mouth daily.      Marland Kitchen oxybutynin (DITROPAN-XL) 10 MG 24 hr tablet Take 10 mg by mouth daily.      Marland Kitchen PARoxetine (PAXIL) 30 MG tablet Take 30 mg by mouth daily.      . potassium chloride (K-DUR) 10 MEQ tablet Take 10 mEq by mouth daily.      . predniSONE (DELTASONE) 10 MG tablet Take 10 mg by  mouth daily with breakfast.       . prochlorperazine (COMPAZINE) 10 MG tablet Take 1 tablet (10 mg total) by mouth every 6 (six) hours as needed for nausea or vomiting.  60 tablet  0  . rosuvastatin (CRESTOR) 10 MG tablet Take 10 mg by mouth daily.      Marland Kitchen SPIRIVA HANDIHALER 18 MCG inhalation capsule       . vitamin E 400 UNIT capsule Take 400 Units by mouth daily.       No current facility-administered medications for this visit.    SURGICAL HISTORY:  Past Surgical History  Procedure Laterality Date  . Bladder suspension    . Abdominal hysterectomy      severe bleeding-when she was in her 30's  . Lung biopsy      REVIEW OF SYSTEMS:  Constitutional: positive for fatigue Eyes: negative Ears, nose, mouth, throat, and face: negative Respiratory: positive for dyspnea on exertion Cardiovascular: negative Gastrointestinal: negative Genitourinary:negative Integument/breast: negative Hematologic/lymphatic: negative Musculoskeletal:negative Neurological: negative Behavioral/Psych: negative Endocrine: negative Allergic/Immunologic: negative   PHYSICAL EXAMINATION: General appearance: alert, cooperative, fatigued and no distress Head: Normocephalic, without obvious abnormality, atraumatic Neck: no adenopathy, no JVD, supple, symmetrical, trachea midline and thyroid not enlarged, symmetric, no tenderness/mass/nodules Lymph nodes: Cervical, supraclavicular, and axillary nodes normal. Resp: clear to auscultation bilaterally Back: symmetric, no curvature. ROM normal. No CVA tenderness. Cardio: regular rate and rhythm, S1, S2 normal, no murmur, click, rub or gallop GI: soft, non-tender; bowel sounds normal; no masses,  no organomegaly Extremities: extremities normal, atraumatic, no cyanosis or edema  ECOG PERFORMANCE STATUS: 1 - Symptomatic but completely ambulatory  Blood pressure 114/50, pulse 78, temperature 97.5 F (36.4 C), resp. rate 18, height 5\' 3"  (1.6 m), weight 153 lb 3.2 oz  (69.491 kg), SpO2 96.00%.  LABORATORY DATA: Lab Results  Component Value Date   WBC 9.0 09/15/2013   HGB 11.2* 09/15/2013   HCT 34.4* 09/15/2013   MCV 88.1 09/15/2013   PLT 396 09/15/2013      Chemistry      Component Value Date/Time   NA 138 09/08/2013 1306   NA 137 08/18/2013 0742   K 4.5 09/08/2013 1306   K 4.5 08/18/2013 0742   CL 95* 08/18/2013 7425  CO2 25 09/08/2013 1306   CO2 31 08/18/2013 0742   BUN 12.8 09/08/2013 1306   BUN 23 08/18/2013 0742   CREATININE 0.6 09/08/2013 1306   CREATININE 0.40* 08/18/2013 0742      Component Value Date/Time   CALCIUM 9.6 09/08/2013 1306   CALCIUM 9.2 08/18/2013 0742   ALKPHOS 114 09/08/2013 1306   ALKPHOS 104 08/12/2013 0721   AST 14 09/08/2013 1306   AST 16 08/12/2013 0721   ALT 16 09/08/2013 1306   ALT 16 08/12/2013 0721   BILITOT 0.21 09/08/2013 1306   BILITOT 0.3 08/12/2013 2585       RADIOGRAPHIC STUDIES: Mr Kizzie Fantasia Contrast  2013/09/12   CLINICAL DATA:  58 year old female with new diagnosis small cell lung cancer. Staging. Subsequent encounter.  EXAM: MRI HEAD WITHOUT AND WITH CONTRAST  TECHNIQUE: Multiplanar, multiecho pulse sequences of the brain and surrounding structures were obtained without and with intravenous contrast.  CONTRAST:  56mL MULTIHANCE GADOBENATE DIMEGLUMINE 529 MG/ML IV SOLN  COMPARISON:  None.  FINDINGS: Motion artifact on post-contrast images. No abnormal enhancement identified.  Mildly decreased T1 bone marrow signal in the skull, mostly parietal and occipital bones. Mildly heterogeneous T1 signal at the skullbase and in the visible cervical spine. No destructive osseous lesion identified.  No restricted diffusion or evidence of acute infarction. Major intracranial vascular flow voids are preserved. Cerebral volume is normal. No ventriculomegaly. No midline shift, mass effect, or evidence of intracranial mass lesion. Negative pituitary and cervicomedullary junction. Mild for age small cerebral white matter foci of T2 and FLAIR hyperintensity, more  so in the right hemisphere. Mild to moderate patchy T2 and FLAIR hyperintensity in the pons. These are nonspecific. No acute intracranial hemorrhage identified.  Visualized orbit soft tissues are within normal limits. Visualized paranasal sinuses and mastoids are clear. Trace retained secretions in the nasopharynx. Visualized scalp soft tissues are within normal limits.  IMPRESSION: No acute or metastatic intracranial abnormality. Some motion artifact on post-contrast images.  Mild for age nonspecific signal changes, most commonly due to chronic small vessel disease.   Electronically Signed   By: Lars Pinks M.D.   On: September 12, 2013 10:06   Nm Pet Image Initial (pi) Skull Base To Thigh  09/10/2013   CLINICAL DATA:  Initial treatment strategy for staging of small cell lung cancer.  EXAM: NUCLEAR MEDICINE PET SKULL BASE TO THIGH  FASTING BLOOD GLUCOSE:  Value: 145 mg/dl  TECHNIQUE: 9.1 mCi F-18 FDG was injected intravenously. Full-ring PET imaging was performed from the skull base to thigh after the radiotracer. CT data was obtained and used for attenuation correction and anatomic localization.  COMPARISON:  CT CHEST W/CM dated 08/07/2013  FINDINGS: NECK  Low right cervical/ supraclavicular hypermetabolic adenopathy. An index right supraclavicular node measures 1.6 cm and a S.U.V. max of 6.3 on image 36.  CHEST  A left thoracic inlet node measures 1.0 cm and a S.U.V. max of 3.9 on image 40. Right paratracheal nodal mass of 3.1 x 4.0 cm. This measures a S.U.V. max of the 8.8.  Anterior right upper lobe lung nodule measures 2.8 cm and a S.U.V. max of 9.5 on image 66. A right lower lobe nodule measures 1.2 cm and a S.U.V. max of 2.6 on image 78.  Hypermetabolic prevascular and right hilar adenopathy as well.  ABDOMEN/PELVIS  No areas of abnormal hypermetabolism.  SKELETON  Multi focal osseous metastasis. Index right humeral head lesion which measures a S.U.V. max of 4.2 on image 32. A  likely subtly sclerotic right femoral  neck lesion measures a S.U.V. max of 4.7 on image 181.  CT IMAGES PERFORMED FOR ATTENUATION CORRECTION  Aortic and coronary artery atherosclerosis.  Pulmonary fibrosis.  Mild cirrhosis. A small hiatal hernia. Normal adrenal glands. Hysterectomy.  IMPRESSION: 1. Nodal metastasis within the chest and lower neck. Right upper and right lower lobe lung lesions which could represent primary or primaries. 2. Osseous metastasis. 3. Cirrhosis. 4.  Age advanced coronary artery atherosclerosis.   Electronically Signed   By: Abigail Miyamoto M.D.   On: 09/10/2013 16:40   Ir US Guide Bx Asp/drain  08/18/2013   INDICATION: History of indeterminate right upper lobe pulmonary nodule, now with extensive mediastinal, right hilar and right supraclavicular lymphadenopathy worrisome for metastatic disease.  EXAM: IR ULTRASOUND GUIDED RIGHT SUPRACLAVICULAR LYMPH NODE BIOPSY  MEDICATIONS: None  TECHNIQUE: Informed written consent was obtained from the patient after a discussion of the risks, benefits and alternatives to treatment. Questions regarding the procedure were encouraged and answered. Initial ultrasound scanning demonstrated an enlarged approximately 2.6 x 2.5 cm hypoechoic right supraclavicular lymph node which correlates with the enlarged lymph node demonstrated on preceding chest CT and the patient's palpable area of concern. An ultrasound image was saved for documentation purposes. The procedure was planned. A timeout was performed prior to the initiation of the procedure.  The operative was prepped and draped in the usual sterile fashion, and a sterile drape was applied covering the operative field. Local anesthesia was provided with 1% lidocaine with epinephrine.  Under direct ultrasound guidance, an 18 gauge core needle device was utilized to obtain to obtain 5 core needle biopsies of the enlarged right supraclavicular lymph node.  The samples were placed in saline and submitted to pathology. The needle was removed and  hemostasis was achieved with manual compression. Post procedure scan was negative for significant hematoma. A dressing was placed. The patient tolerated the procedure well without immediate postprocedural complication.  ANESTHESIA/SEDATION: None  COMPARISON:  CT CHEST W/CM dated 7/91/5056  COMPLICATIONS: None immediate  IMPRESSION: Technically successful ultrasound guided right supraclavicular lymph node biopsy.   Electronically Signed   By: Sandi Mariscal M.D.   On: 08/18/2013 16:42    ASSESSMENT AND PLAN: This is a very pleasant 58 years old white female with extensive stage small cell lung cancer currently undergoing systemic chemotherapy with carboplatin and etoposide status post 1 cycle. She tolerated the first cycle of her treatment fairly well with no significant adverse effects. I discussed the MRI of the brain as well as the PET scan with the patient today. I recommended for her to continue her current treatment with carboplatin and etoposide as scheduled. Her next cycle will be on 09/29/2013. She would come back for followup visit in 2 weeks with the start of cycle #2. She was advised to call immediately if she has any concerning symptoms in the interval. The patient voices understanding of current disease status and treatment options and is in agreement with the current care plan.  All questions were answered. The patient knows to call the clinic with any problems, questions or concerns. We can certainly see the patient much sooner if necessary.   Disclaimer: This note was dictated with voice recognition software. Similar sounding words can inadvertently be transcribed and may not be corrected upon review.

## 2013-09-18 ENCOUNTER — Telehealth: Payer: Self-pay | Admitting: Internal Medicine

## 2013-09-18 NOTE — Telephone Encounter (Signed)
per MM pt can be seen by AJ 3/23. lmonvm for pt re new appts beginning 09/29/13. other appts remain the same and pt to get new schdule @ 3/16 appt.

## 2013-09-22 ENCOUNTER — Other Ambulatory Visit (HOSPITAL_BASED_OUTPATIENT_CLINIC_OR_DEPARTMENT_OTHER): Payer: Medicare Other

## 2013-09-22 DIAGNOSIS — C3490 Malignant neoplasm of unspecified part of unspecified bronchus or lung: Secondary | ICD-10-CM

## 2013-09-22 DIAGNOSIS — C3410 Malignant neoplasm of upper lobe, unspecified bronchus or lung: Secondary | ICD-10-CM

## 2013-09-22 LAB — COMPREHENSIVE METABOLIC PANEL (CC13)
ALK PHOS: 158 U/L — AB (ref 40–150)
ALT: 14 U/L (ref 0–55)
AST: 12 U/L (ref 5–34)
Albumin: 3.3 g/dL — ABNORMAL LOW (ref 3.5–5.0)
Anion Gap: 9 mEq/L (ref 3–11)
BUN: 12.4 mg/dL (ref 7.0–26.0)
CALCIUM: 9.4 mg/dL (ref 8.4–10.4)
CO2: 25 mEq/L (ref 22–29)
CREATININE: 0.6 mg/dL (ref 0.6–1.1)
Chloride: 103 mEq/L (ref 98–109)
Glucose: 190 mg/dl — ABNORMAL HIGH (ref 70–140)
Potassium: 4.1 mEq/L (ref 3.5–5.1)
Sodium: 138 mEq/L (ref 136–145)
Total Bilirubin: 0.21 mg/dL (ref 0.20–1.20)
Total Protein: 6.3 g/dL — ABNORMAL LOW (ref 6.4–8.3)

## 2013-09-22 LAB — CBC WITH DIFFERENTIAL/PLATELET
BASO%: 0.5 % (ref 0.0–2.0)
Basophils Absolute: 0.1 10*3/uL (ref 0.0–0.1)
EOS%: 0.7 % (ref 0.0–7.0)
Eosinophils Absolute: 0.1 10*3/uL (ref 0.0–0.5)
HEMATOCRIT: 34.9 % (ref 34.8–46.6)
HEMOGLOBIN: 11.5 g/dL — AB (ref 11.6–15.9)
LYMPH%: 7.7 % — ABNORMAL LOW (ref 14.0–49.7)
MCH: 29.1 pg (ref 25.1–34.0)
MCHC: 33.1 g/dL (ref 31.5–36.0)
MCV: 87.9 fL (ref 79.5–101.0)
MONO#: 1 10*3/uL — ABNORMAL HIGH (ref 0.1–0.9)
MONO%: 6.7 % (ref 0.0–14.0)
NEUT#: 12 10*3/uL — ABNORMAL HIGH (ref 1.5–6.5)
NEUT%: 84.4 % — AB (ref 38.4–76.8)
Platelets: 191 10*3/uL (ref 145–400)
RBC: 3.97 10*6/uL (ref 3.70–5.45)
RDW: 20 % — ABNORMAL HIGH (ref 11.2–14.5)
WBC: 14.2 10*3/uL — ABNORMAL HIGH (ref 3.9–10.3)
lymph#: 1.1 10*3/uL (ref 0.9–3.3)

## 2013-09-29 ENCOUNTER — Ambulatory Visit (HOSPITAL_BASED_OUTPATIENT_CLINIC_OR_DEPARTMENT_OTHER): Payer: Medicare Other

## 2013-09-29 ENCOUNTER — Encounter: Payer: Self-pay | Admitting: Internal Medicine

## 2013-09-29 ENCOUNTER — Other Ambulatory Visit (HOSPITAL_BASED_OUTPATIENT_CLINIC_OR_DEPARTMENT_OTHER): Payer: Medicare Other

## 2013-09-29 ENCOUNTER — Encounter: Payer: Self-pay | Admitting: Physician Assistant

## 2013-09-29 ENCOUNTER — Ambulatory Visit (HOSPITAL_BASED_OUTPATIENT_CLINIC_OR_DEPARTMENT_OTHER): Payer: Medicare Other | Admitting: Physician Assistant

## 2013-09-29 VITALS — BP 116/52 | HR 78 | Temp 97.1°F | Resp 18 | Ht 63.0 in | Wt 158.6 lb

## 2013-09-29 DIAGNOSIS — C3490 Malignant neoplasm of unspecified part of unspecified bronchus or lung: Secondary | ICD-10-CM

## 2013-09-29 DIAGNOSIS — C7A1 Malignant poorly differentiated neuroendocrine tumors: Secondary | ICD-10-CM

## 2013-09-29 DIAGNOSIS — R110 Nausea: Secondary | ICD-10-CM

## 2013-09-29 DIAGNOSIS — M810 Age-related osteoporosis without current pathological fracture: Secondary | ICD-10-CM

## 2013-09-29 DIAGNOSIS — Z5111 Encounter for antineoplastic chemotherapy: Secondary | ICD-10-CM

## 2013-09-29 DIAGNOSIS — C7B8 Other secondary neuroendocrine tumors: Secondary | ICD-10-CM

## 2013-09-29 LAB — CBC WITH DIFFERENTIAL/PLATELET
BASO%: 0.1 % (ref 0.0–2.0)
BASOS ABS: 0 10*3/uL (ref 0.0–0.1)
EOS ABS: 0 10*3/uL (ref 0.0–0.5)
EOS%: 0.2 % (ref 0.0–7.0)
HEMATOCRIT: 36.6 % (ref 34.8–46.6)
HEMOGLOBIN: 11.8 g/dL (ref 11.6–15.9)
LYMPH%: 4.2 % — ABNORMAL LOW (ref 14.0–49.7)
MCH: 29.3 pg (ref 25.1–34.0)
MCHC: 32.2 g/dL (ref 31.5–36.0)
MCV: 90.8 fL (ref 79.5–101.0)
MONO#: 0.5 10*3/uL (ref 0.1–0.9)
MONO%: 4 % (ref 0.0–14.0)
NEUT%: 91.5 % — ABNORMAL HIGH (ref 38.4–76.8)
NEUTROS ABS: 12.4 10*3/uL — AB (ref 1.5–6.5)
Platelets: 378 10*3/uL (ref 145–400)
RBC: 4.03 10*6/uL (ref 3.70–5.45)
RDW: 20 % — ABNORMAL HIGH (ref 11.2–14.5)
WBC: 13.6 10*3/uL — ABNORMAL HIGH (ref 3.9–10.3)
lymph#: 0.6 10*3/uL — ABNORMAL LOW (ref 0.9–3.3)
nRBC: 0 % (ref 0–0)

## 2013-09-29 LAB — COMPREHENSIVE METABOLIC PANEL (CC13)
ALT: 13 U/L (ref 0–55)
ANION GAP: 10 meq/L (ref 3–11)
AST: 13 U/L (ref 5–34)
Albumin: 3.3 g/dL — ABNORMAL LOW (ref 3.5–5.0)
Alkaline Phosphatase: 174 U/L — ABNORMAL HIGH (ref 40–150)
BUN: 14.5 mg/dL (ref 7.0–26.0)
CO2: 24 meq/L (ref 22–29)
Calcium: 9 mg/dL (ref 8.4–10.4)
Chloride: 107 mEq/L (ref 98–109)
Creatinine: 0.6 mg/dL (ref 0.6–1.1)
GLUCOSE: 224 mg/dL — AB (ref 70–140)
Potassium: 4.4 mEq/L (ref 3.5–5.1)
SODIUM: 141 meq/L (ref 136–145)
TOTAL PROTEIN: 6.4 g/dL (ref 6.4–8.3)
Total Bilirubin: <0.2 mg/dL (ref 0.20–1.20)

## 2013-09-29 MED ORDER — SODIUM CHLORIDE 0.9 % IV SOLN
534.0000 mg | Freq: Once | INTRAVENOUS | Status: AC
  Administered 2013-09-29: 530 mg via INTRAVENOUS
  Filled 2013-09-29: qty 53

## 2013-09-29 MED ORDER — ONDANSETRON 16 MG/50ML IVPB (CHCC)
INTRAVENOUS | Status: AC
  Filled 2013-09-29: qty 16

## 2013-09-29 MED ORDER — DEXAMETHASONE SODIUM PHOSPHATE 20 MG/5ML IJ SOLN
20.0000 mg | Freq: Once | INTRAMUSCULAR | Status: AC
  Administered 2013-09-29: 20 mg via INTRAVENOUS

## 2013-09-29 MED ORDER — SODIUM CHLORIDE 0.9 % IV SOLN
100.0000 mg/m2 | Freq: Once | INTRAVENOUS | Status: AC
  Administered 2013-09-29: 170 mg via INTRAVENOUS
  Filled 2013-09-29: qty 8.5

## 2013-09-29 MED ORDER — DEXAMETHASONE SODIUM PHOSPHATE 20 MG/5ML IJ SOLN
INTRAMUSCULAR | Status: AC
  Filled 2013-09-29: qty 5

## 2013-09-29 MED ORDER — SODIUM CHLORIDE 0.9 % IV SOLN
Freq: Once | INTRAVENOUS | Status: AC
  Administered 2013-09-29: 12:00:00 via INTRAVENOUS

## 2013-09-29 MED ORDER — ONDANSETRON 16 MG/50ML IVPB (CHCC)
16.0000 mg | Freq: Once | INTRAVENOUS | Status: AC
  Administered 2013-09-29: 16 mg via INTRAVENOUS

## 2013-09-29 NOTE — Patient Instructions (Signed)
Continue weekly labs as scheduled Followup with Dr. Julien Nordmann in 3 weeks prior to your next cycle of chemotherapy with a restaging CT scan of the chest, abdomen and pelvis to reevaluate your disease

## 2013-09-29 NOTE — Patient Instructions (Signed)
[facility] Discharge Instructions for Patients Receiving Chemotherapy  Today you received the following chemotherapy agents:  Carboplatin and Etoposide.  To help prevent nausea and vomiting after your treatment, we encourage you to take your nausea medication as ordered per MD.   If you develop nausea and vomiting that is not controlled by your nausea medication, call the clinic.   BELOW ARE SYMPTOMS THAT SHOULD BE REPORTED IMMEDIATELY:  *FEVER GREATER THAN 100.5 F  *CHILLS WITH OR WITHOUT FEVER  NAUSEA AND VOMITING THAT IS NOT CONTROLLED WITH YOUR NAUSEA MEDICATION  *UNUSUAL SHORTNESS OF BREATH  *UNUSUAL BRUISING OR BLEEDING  TENDERNESS IN MOUTH AND THROAT WITH OR WITHOUT PRESENCE OF ULCERS  *URINARY PROBLEMS  *BOWEL PROBLEMS  UNUSUAL RASH Items with * indicate a potential emergency and should be followed up as soon as possible.  Feel free to call the clinic you have any questions or concerns. The clinic phone number is [phone].

## 2013-09-29 NOTE — Progress Notes (Addendum)
[facility] [phone]   [phone]  SHARED VISIT PROGRESS NOTE  Jessica Harrison Leafy Kindle., MD [address] [place] [address]  DIAGNOSIS: Extensive stage small cell lung cancer diagnosed in February of 2015.  PRIOR THERAPY: None  CURRENT THERAPY: Systemic chemotherapy with carboplatin for AUC of 5 on day 1 and etoposide 100 mg/M2 on days 1, 2 and 3 with Neulasta support on day 4. She is status post 1 cycle.   INTERVAL HISTORY: Jessica Rollins 58 y.o. female returns to the clinic today for followup visit accompanied by her son. The patient tolerated the first cycle of her systemic chemotherapy fairly well with no significant adverse effects. He did have some nausea that was well-controlled with her Compazine. Chills reported some achiness in her legs that lasted 2 days after her Neulasta injection. She did take Claritin as advised. She denied having any significant fever or chills. She denied having any chest pain but continues to have the baseline shortness of breath and she is currently on home oxygen at 3-3.5 L per minute. She denied cough or hemoptysis. She has no significant weight loss or night sweats. She denied any fever but reports some occasional subjective chills. She denies any current nausea or vomiting, no diarrhea or constipation. She denied any pain today.   MEDICAL HISTORY: Past Medical History  Diagnosis Date  . COPD (chronic obstructive pulmonary disease)   . Pulmonary fibrosis     diagnosed with this 2001.    . Diabetes mellitus without complication   . Hiatal hernia   . Depression   . Anxiety   . High cholesterol   . Hypertension   . Osteoporosis   . OSA on CPAP     13cm  . Overactive bladder   . Heart murmur     ALLERGIES:  has No Known Allergies.  MEDICATIONS:  Current Outpatient Prescriptions  Medication Sig Dispense Refill  . albuterol (PROVENTIL) (2.5 MG/3ML) 0.083% nebulizer solution Take 3 mLs (2.5 mg  total) by nebulization every 2 (two) hours as needed for wheezing or shortness of breath.  75 mL  12  . azaTHIOprine (IMURAN) 50 MG tablet Take 50 mg by mouth 2 (two) times daily.      . calcium citrate-vitamin D (CITRACAL+D) 315-200 MG-UNIT per tablet Take 1 tablet by mouth daily.      . Cimetidine (TAGAMET PO) Take 1 tablet by mouth daily.      . cyanocobalamin 1000 MCG tablet Take 100 mcg by mouth daily.      Marland Kitchen esomeprazole (NEXIUM) 40 MG capsule Take 40 mg by mouth daily before breakfast.      . folic acid (FOLVITE) 1 MG tablet Take 1 mg by mouth 2 (two) times daily.      Marland Kitchen guaiFENesin (MUCINEX) 600 MG 12 hr tablet Take 600 mg by mouth 2 (two) times daily.      . IRON PO Take 1 tablet by mouth 4 (four) times daily.      Marland Kitchen LEVEMIR FLEXTOUCH 100 UNIT/ML Pen       . LORazepam (ATIVAN) 1 MG tablet Take 1 mg by mouth at bedtime.      . Multiple Vitamin (MULTIVITAMIN WITH MINERALS) TABS tablet Take 1 tablet by mouth daily.      . niacin 500 MG tablet Take 500 mg by mouth daily.      . Omega-3 Fatty Acids (FISH OIL) 300 MG CAPS Take 1 capsule by mouth  daily.      . oxybutynin (DITROPAN-XL) 10 MG 24 hr tablet Take 10 mg by mouth daily.      Marland Kitchen PARoxetine (PAXIL) 30 MG tablet Take 30 mg by mouth daily.      . potassium chloride (K-DUR) 10 MEQ tablet Take 10 mEq by mouth daily.      . predniSONE (DELTASONE) 10 MG tablet Take 10 mg by mouth daily with breakfast.       . prochlorperazine (COMPAZINE) 10 MG tablet Take 1 tablet (10 mg total) by mouth every 6 (six) hours as needed for nausea or vomiting.  60 tablet  0  . rosuvastatin (CRESTOR) 10 MG tablet Take 10 mg by mouth daily.      Marland Kitchen SPIRIVA HANDIHALER 18 MCG inhalation capsule       . vitamin E 400 UNIT capsule Take 400 Units by mouth daily.      Marland Kitchen ADVAIR DISKUS 250-50 MCG/DOSE AEPB 1 puff.       Marland Kitchen amLODipine (NORVASC) 5 MG tablet Take 1 tablet (5 mg total) by mouth daily.      Marland Kitchen aspirin 325 MG tablet Take 325 mg by mouth daily.      . insulin  aspart (NOVOLOG) 100 UNIT/ML injection 0-15 Units, Subcutaneous, 3 times daily with meals Correction coverage: Moderate (average weight, post-op) CBG < 70: implement hypoglycemia protocol CBG 70 - 120: 0 units CBG 121 - 150: 2 units CBG 151 - 200: 3 units CBG 201 - 250: 5 units CBG 251 - 300: 8 units CBG 301 - 350: 11 units CBG 351 - 400: 15 units CBG > 400: call MD and obtain STAT lab verification  10 mL  11  . insulin glargine (LANTUS) 100 UNIT/ML injection Inject 0.05 mLs (5 Units total) into the skin daily.  10 mL  11  . lisinopril (PRINIVIL,ZESTRIL) 10 MG tablet       . metFORMIN (GLUCOPHAGE) 500 MG tablet        No current facility-administered medications for this visit.   Facility-Administered Medications Ordered in Other Visits  Medication Dose Route Frequency Provider Last Rate Last Dose  . CARBOplatin (PARAPLATIN) 530 mg in sodium chloride 0.9 % 250 mL chemo infusion  530 mg Intravenous Once Juan Olthoff E Akeela Busk, PA-C      . etoposide (VEPESID) 170 mg in sodium chloride 0.9 % 500 mL chemo infusion  100 mg/m2 (Treatment Plan Actual) Intravenous Once Carlton Adam, PA-C        SURGICAL HISTORY:  Past Surgical History  Procedure Laterality Date  . Bladder suspension    . Abdominal hysterectomy      severe bleeding-when she was in her 30's  . Lung biopsy      REVIEW OF SYSTEMS:  Constitutional: positive for fatigue Eyes: negative Ears, nose, mouth, throat, and face: negative Respiratory: positive for dyspnea on exertion Cardiovascular: negative Gastrointestinal: negative Genitourinary:negative Integument/breast: negative Hematologic/lymphatic: negative Musculoskeletal:negative Neurological: negative Behavioral/Psych: negative Endocrine: negative Allergic/Immunologic: negative   PHYSICAL EXAMINATION: General appearance: alert, cooperative, fatigued and no distress Head: Normocephalic, without obvious abnormality, atraumatic Neck: no adenopathy, no JVD, supple,  symmetrical, trachea midline and thyroid not enlarged, symmetric, no tenderness/mass/nodules Lymph nodes: Cervical, supraclavicular, and axillary nodes normal. Resp: clear to auscultation bilaterally Back: symmetric, no curvature. ROM normal. No CVA tenderness. Cardio: regular rate and rhythm, S1, S2 normal, no murmur, click, rub or gallop GI: soft, non-tender; bowel sounds normal; no masses,  no organomegaly Extremities: extremities normal, atraumatic, no cyanosis or edema  ECOG PERFORMANCE STATUS: 1 - Symptomatic but completely ambulatory  Blood pressure 116/52, pulse 78, temperature 97.1 F (36.2 C), temperature source Oral, resp. rate 18, height 5\' 3"  (1.6 m), weight 158 lb 9.6 oz (71.94 kg), SpO2 97.00%.  LABORATORY DATA: Lab Results  Component Value Date   WBC 13.6* 09/29/2013   HGB 11.8 09/29/2013   HCT 36.6 09/29/2013   MCV 90.8 09/29/2013   PLT 378 09/29/2013      Chemistry      Component Value Date/Time   NA 138 09/22/2013 0904   NA 137 08/18/2013 0742   K 4.1 09/22/2013 0904   K 4.5 08/18/2013 0742   CL 95* 08/18/2013 0742   CO2 25 09/22/2013 0904   CO2 31 08/18/2013 0742   BUN 12.4 09/22/2013 0904   BUN 23 08/18/2013 0742   CREATININE 0.6 09/22/2013 0904   CREATININE 0.40* 08/18/2013 0742      Component Value Date/Time   CALCIUM 9.4 09/22/2013 0904   CALCIUM 9.2 08/18/2013 0742   ALKPHOS 158* 09/22/2013 0904   ALKPHOS 104 08/12/2013 0721   AST 12 09/22/2013 0904   AST 16 08/12/2013 0721   ALT 14 09/22/2013 0904   ALT 16 08/12/2013 0721   BILITOT 0.21 09/22/2013 0904   BILITOT 0.3 08/12/2013 0721       RADIOGRAPHIC STUDIES: Mr Jeri Cos Wo Contrast  2013-09-10   CLINICAL DATA:  58 year old female with new diagnosis small cell lung cancer. Staging. Subsequent encounter.  EXAM: MRI HEAD WITHOUT AND WITH CONTRAST  TECHNIQUE: Multiplanar, multiecho pulse sequences of the brain and surrounding structures were obtained without and with intravenous contrast.  CONTRAST:  77mL MULTIHANCE GADOBENATE  DIMEGLUMINE 529 MG/ML IV SOLN  COMPARISON:  None.  FINDINGS: Motion artifact on post-contrast images. No abnormal enhancement identified.  Mildly decreased T1 bone marrow signal in the skull, mostly parietal and occipital bones. Mildly heterogeneous T1 signal at the skullbase and in the visible cervical spine. No destructive osseous lesion identified.  No restricted diffusion or evidence of acute infarction. Major intracranial vascular flow voids are preserved. Cerebral volume is normal. No ventriculomegaly. No midline shift, mass effect, or evidence of intracranial mass lesion. Negative pituitary and cervicomedullary junction. Mild for age small cerebral white matter foci of T2 and FLAIR hyperintensity, more so in the right hemisphere. Mild to moderate patchy T2 and FLAIR hyperintensity in the pons. These are nonspecific. No acute intracranial hemorrhage identified.  Visualized orbit soft tissues are within normal limits. Visualized paranasal sinuses and mastoids are clear. Trace retained secretions in the nasopharynx. Visualized scalp soft tissues are within normal limits.  IMPRESSION: No acute or metastatic intracranial abnormality. Some motion artifact on post-contrast images.  Mild for age nonspecific signal changes, most commonly due to chronic small vessel disease.   Electronically Signed   By: Lars Pinks M.D.   On: 09-10-2013 10:06   Nm Pet Image Initial (pi) Skull Base To Thigh  09/10/2013   CLINICAL DATA:  Initial treatment strategy for staging of small cell lung cancer.  EXAM: NUCLEAR MEDICINE PET SKULL BASE TO THIGH  FASTING BLOOD GLUCOSE:  Value: 145 mg/dl  TECHNIQUE: 9.1 mCi F-18 FDG was injected intravenously. Full-ring PET imaging was performed from the skull base to thigh after the radiotracer. CT data was obtained and used for attenuation correction and anatomic localization.  COMPARISON:  CT CHEST W/CM dated 08/07/2013  FINDINGS: NECK  Low right cervical/ supraclavicular hypermetabolic adenopathy.  An index right supraclavicular node measures 1.6 cm and  a S.U.V. max of 6.3 on image 36.  CHEST  A left thoracic inlet node measures 1.0 cm and a S.U.V. max of 3.9 on image 40. Right paratracheal nodal mass of 3.1 x 4.0 cm. This measures a S.U.V. max of the 8.8.  Anterior right upper lobe lung nodule measures 2.8 cm and a S.U.V. max of 9.5 on image 66. A right lower lobe nodule measures 1.2 cm and a S.U.V. max of 2.6 on image 78.  Hypermetabolic prevascular and right hilar adenopathy as well.  ABDOMEN/PELVIS  No areas of abnormal hypermetabolism.  SKELETON  Multi focal osseous metastasis. Index right humeral head lesion which measures a S.U.V. max of 4.2 on image 32. A likely subtly sclerotic right femoral neck lesion measures a S.U.V. max of 4.7 on image 181.  CT IMAGES PERFORMED FOR ATTENUATION CORRECTION  Aortic and coronary artery atherosclerosis.  Pulmonary fibrosis.  Mild cirrhosis. A small hiatal hernia. Normal adrenal glands. Hysterectomy.  IMPRESSION: 1. Nodal metastasis within the chest and lower neck. Right upper and right lower lobe lung lesions which could represent primary or primaries. 2. Osseous metastasis. 3. Cirrhosis. 4.  Age advanced coronary artery atherosclerosis.   Electronically Signed   By: Abigail Miyamoto M.D.   On: 09/10/2013 16:40   Ir US Guide Bx Asp/drain  08/18/2013   INDICATION: History of indeterminate right upper lobe pulmonary nodule, now with extensive mediastinal, right hilar and right supraclavicular lymphadenopathy worrisome for metastatic disease.  EXAM: IR ULTRASOUND GUIDED RIGHT SUPRACLAVICULAR LYMPH NODE BIOPSY  MEDICATIONS: None  TECHNIQUE: Informed written consent was obtained from the patient after a discussion of the risks, benefits and alternatives to treatment. Questions regarding the procedure were encouraged and answered. Initial ultrasound scanning demonstrated an enlarged approximately 2.6 x 2.5 cm hypoechoic right supraclavicular lymph node which correlates with  the enlarged lymph node demonstrated on preceding chest CT and the patient's palpable area of concern. An ultrasound image was saved for documentation purposes. The procedure was planned. A timeout was performed prior to the initiation of the procedure.  The operative was prepped and draped in the usual sterile fashion, and a sterile drape was applied covering the operative field. Local anesthesia was provided with 1% lidocaine with epinephrine.  Under direct ultrasound guidance, an 18 gauge core needle device was utilized to obtain to obtain 5 core needle biopsies of the enlarged right supraclavicular lymph node.  The samples were placed in saline and submitted to pathology. The needle was removed and hemostasis was achieved with manual compression. Post procedure scan was negative for significant hematoma. A dressing was placed. The patient tolerated the procedure well without immediate postprocedural complication.  ANESTHESIA/SEDATION: None  COMPARISON:  CT CHEST W/CM dated 9/37/1696  COMPLICATIONS: None immediate  IMPRESSION: Technically successful ultrasound guided right supraclavicular lymph node biopsy.   Electronically Signed   By: Sandi Mariscal M.D.   On: 08/18/2013 16:42    ASSESSMENT AND PLAN: This is a very pleasant 58 years old white female with extensive stage small cell lung cancer currently undergoing systemic chemotherapy with carboplatin and etoposide status post 1 cycle. Overall she tolerated her first cycle of chemotherapy without any significant difficulty. Patient was discussed with and also seen by Dr. Julien Nordmann. She'll proceed with cycle #2 of her systemic chemotherapy with carboplatin and etoposide with Neulasta support. She'll continue with weekly labs as scheduled. She'll followup with Dr. Julien Nordmann in 3 weeks prior to cycle #3 with repeat CT of the chest, abdomen and pelvis with contrast  to reevaluate her disease.  She was advised to call immediately if she has any concerning symptoms in  the interval. The patient voices understanding of current disease status and treatment options and is in agreement with the current care plan.  All questions were answered. The patient knows to call the clinic with any problems, questions or concerns. We can certainly see the patient much sooner if necessary.  Carlton Adam PA-C   ADDENDUM: Hematology/Oncology Attending:  I had a face to face encounter with the patient today. I recommended her care plan. This is a very pleasant 58 years old white female with extensive stage small cell lung cancer, currently undergoing systemic chemotherapy with carboplatin and etoposide status post 1 cycle. The patient tolerated the first cycle of her treatment fairly well with no significant adverse effects except for mild fatigue. I recommended for the patient to continue her treatment was carboplatin and etoposide as scheduled. She will receive cycle #2 today. She would come back for followup visit in 3 weeks with repeat CT scan of the chest, abdomen and pelvis for restaging of her disease before starting cycle #3. She was advised to call immediately if she has any concerning symptoms in the interval.  Disclaimer: This note was dictated with voice recognition software. Similar sounding words can inadvertently be transcribed and may not be corrected upon review. Eilleen Kempf., MD 09/29/2013

## 2013-09-30 ENCOUNTER — Ambulatory Visit (HOSPITAL_BASED_OUTPATIENT_CLINIC_OR_DEPARTMENT_OTHER): Payer: Medicare Other

## 2013-09-30 ENCOUNTER — Telehealth: Payer: Self-pay | Admitting: Internal Medicine

## 2013-09-30 VITALS — BP 140/68 | HR 84 | Temp 97.0°F | Resp 20

## 2013-09-30 DIAGNOSIS — C3490 Malignant neoplasm of unspecified part of unspecified bronchus or lung: Secondary | ICD-10-CM

## 2013-09-30 DIAGNOSIS — Z5111 Encounter for antineoplastic chemotherapy: Secondary | ICD-10-CM

## 2013-09-30 MED ORDER — SODIUM CHLORIDE 0.9 % IV SOLN
Freq: Once | INTRAVENOUS | Status: AC
  Administered 2013-09-30: 14:00:00 via INTRAVENOUS

## 2013-09-30 MED ORDER — PROCHLORPERAZINE MALEATE 10 MG PO TABS
ORAL_TABLET | ORAL | Status: AC
  Filled 2013-09-30: qty 1

## 2013-09-30 MED ORDER — SODIUM CHLORIDE 0.9 % IV SOLN
100.0000 mg/m2 | Freq: Once | INTRAVENOUS | Status: AC
  Administered 2013-09-30: 170 mg via INTRAVENOUS
  Filled 2013-09-30: qty 8.5

## 2013-09-30 MED ORDER — PROCHLORPERAZINE MALEATE 10 MG PO TABS
10.0000 mg | ORAL_TABLET | Freq: Once | ORAL | Status: AC
  Administered 2013-09-30: 10 mg via ORAL

## 2013-09-30 NOTE — Patient Instructions (Signed)
[facility] Discharge Instructions for Patients Receiving Chemotherapy  Today you received the following chemotherapy agents: Etoposide  To help prevent nausea and vomiting after your treatment, we encourage you to take your nausea medication as prescribed by your physician: Compazine 10 mg every 6 hrs.    If you develop nausea and vomiting that is not controlled by your nausea medication, call the clinic.   BELOW ARE SYMPTOMS THAT SHOULD BE REPORTED IMMEDIATELY:  *FEVER GREATER THAN 100.5 F  *CHILLS WITH OR WITHOUT FEVER  NAUSEA AND VOMITING THAT IS NOT CONTROLLED WITH YOUR NAUSEA MEDICATION  *UNUSUAL SHORTNESS OF BREATH  *UNUSUAL BRUISING OR BLEEDING  TENDERNESS IN MOUTH AND THROAT WITH OR WITHOUT PRESENCE OF ULCERS  *URINARY PROBLEMS  *BOWEL PROBLEMS  UNUSUAL RASH Items with * indicate a potential emergency and should be followed up as soon as possible.  Feel free to call the clinic you have any questions or concerns. The clinic phone number is [phone].

## 2013-09-30 NOTE — Telephone Encounter (Signed)
s.w. pt and advised new time for 4.13 appts....pt ok and aware

## 2013-10-01 ENCOUNTER — Ambulatory Visit (HOSPITAL_BASED_OUTPATIENT_CLINIC_OR_DEPARTMENT_OTHER): Payer: Medicare Other

## 2013-10-01 VITALS — BP 135/62 | HR 87 | Temp 97.7°F | Resp 18

## 2013-10-01 DIAGNOSIS — C3490 Malignant neoplasm of unspecified part of unspecified bronchus or lung: Secondary | ICD-10-CM

## 2013-10-01 DIAGNOSIS — C7B8 Other secondary neuroendocrine tumors: Secondary | ICD-10-CM

## 2013-10-01 DIAGNOSIS — C7A1 Malignant poorly differentiated neuroendocrine tumors: Secondary | ICD-10-CM

## 2013-10-01 DIAGNOSIS — Z5111 Encounter for antineoplastic chemotherapy: Secondary | ICD-10-CM

## 2013-10-01 MED ORDER — PROCHLORPERAZINE MALEATE 10 MG PO TABS
10.0000 mg | ORAL_TABLET | Freq: Once | ORAL | Status: AC
  Administered 2013-10-01: 10 mg via ORAL

## 2013-10-01 MED ORDER — SODIUM CHLORIDE 0.9 % IV SOLN
100.0000 mg/m2 | Freq: Once | INTRAVENOUS | Status: AC
  Administered 2013-10-01: 170 mg via INTRAVENOUS
  Filled 2013-10-01: qty 8.5

## 2013-10-01 MED ORDER — PROCHLORPERAZINE MALEATE 10 MG PO TABS
ORAL_TABLET | ORAL | Status: AC
  Filled 2013-10-01: qty 1

## 2013-10-01 MED ORDER — SODIUM CHLORIDE 0.9 % IV SOLN
Freq: Once | INTRAVENOUS | Status: AC
  Administered 2013-10-01: 14:00:00 via INTRAVENOUS

## 2013-10-01 NOTE — Patient Instructions (Signed)
[facility] Discharge Instructions for Patients Receiving Chemotherapy  Today you received the following chemotherapy agents:  Etoposide  To help prevent nausea and vomiting after your treatment, we encourage you to take your nausea medication as ordered per MD.   If you develop nausea and vomiting that is not controlled by your nausea medication, call the clinic.   BELOW ARE SYMPTOMS THAT SHOULD BE REPORTED IMMEDIATELY:  *FEVER GREATER THAN 100.5 F  *CHILLS WITH OR WITHOUT FEVER  NAUSEA AND VOMITING THAT IS NOT CONTROLLED WITH YOUR NAUSEA MEDICATION  *UNUSUAL SHORTNESS OF BREATH  *UNUSUAL BRUISING OR BLEEDING  TENDERNESS IN MOUTH AND THROAT WITH OR WITHOUT PRESENCE OF ULCERS  *URINARY PROBLEMS  *BOWEL PROBLEMS  UNUSUAL RASH Items with * indicate a potential emergency and should be followed up as soon as possible.  Feel free to call the clinic you have any questions or concerns. The clinic phone number is [phone].

## 2013-10-01 NOTE — Progress Notes (Signed)
[facility] Psychosocial Distress Screening Clinical Social Work  Clinical Social Work was referred by distress screening protocol.  The patient scored a 10 on the Psychosocial Distress Thermometer which indicates severe distress. Clinical Social Worker Intern telephoned to assess for distress and other psychosocial needs. Patient stated that she was "feeling good" and "everything was fine".  Patient referenced her score as a "distressing day" only.  Patient was made aware of available services and agrees to follow up if needed.   Clinical Social Worker follow up needed: no  If yes, follow up plan:   Oronde Hallenbeck S. [place] Work Intern [employer] [phone]

## 2013-10-02 ENCOUNTER — Ambulatory Visit (HOSPITAL_BASED_OUTPATIENT_CLINIC_OR_DEPARTMENT_OTHER): Payer: Medicare Other

## 2013-10-02 VITALS — BP 127/45 | HR 77 | Temp 98.1°F

## 2013-10-02 DIAGNOSIS — C3490 Malignant neoplasm of unspecified part of unspecified bronchus or lung: Secondary | ICD-10-CM

## 2013-10-02 DIAGNOSIS — C7B8 Other secondary neuroendocrine tumors: Secondary | ICD-10-CM

## 2013-10-02 DIAGNOSIS — C7A1 Malignant poorly differentiated neuroendocrine tumors: Secondary | ICD-10-CM

## 2013-10-02 DIAGNOSIS — Z5189 Encounter for other specified aftercare: Secondary | ICD-10-CM

## 2013-10-02 MED ORDER — PEGFILGRASTIM INJECTION 6 MG/0.6ML
6.0000 mg | Freq: Once | SUBCUTANEOUS | Status: AC
  Administered 2013-10-02: 6 mg via SUBCUTANEOUS
  Filled 2013-10-02: qty 0.6

## 2013-10-06 ENCOUNTER — Other Ambulatory Visit (HOSPITAL_BASED_OUTPATIENT_CLINIC_OR_DEPARTMENT_OTHER): Payer: Medicare Other

## 2013-10-06 DIAGNOSIS — C7A1 Malignant poorly differentiated neuroendocrine tumors: Secondary | ICD-10-CM

## 2013-10-06 DIAGNOSIS — C3490 Malignant neoplasm of unspecified part of unspecified bronchus or lung: Secondary | ICD-10-CM

## 2013-10-06 LAB — COMPREHENSIVE METABOLIC PANEL (CC13)
ALT: 11 U/L (ref 0–55)
AST: 13 U/L (ref 5–34)
Albumin: 3.4 g/dL — ABNORMAL LOW (ref 3.5–5.0)
Alkaline Phosphatase: 215 U/L — ABNORMAL HIGH (ref 40–150)
Anion Gap: 12 mEq/L — ABNORMAL HIGH (ref 3–11)
BUN: 13.3 mg/dL (ref 7.0–26.0)
CALCIUM: 9.3 mg/dL (ref 8.4–10.4)
CHLORIDE: 104 meq/L (ref 98–109)
CO2: 24 mEq/L (ref 22–29)
Creatinine: 0.6 mg/dL (ref 0.6–1.1)
Glucose: 193 mg/dl — ABNORMAL HIGH (ref 70–140)
POTASSIUM: 4 meq/L (ref 3.5–5.1)
Sodium: 140 mEq/L (ref 136–145)
Total Bilirubin: 0.29 mg/dL (ref 0.20–1.20)
Total Protein: 6.2 g/dL — ABNORMAL LOW (ref 6.4–8.3)

## 2013-10-06 LAB — CBC WITH DIFFERENTIAL/PLATELET
BASO%: 0.6 % (ref 0.0–2.0)
Basophils Absolute: 0.1 10*3/uL (ref 0.0–0.1)
EOS%: 0.7 % (ref 0.0–7.0)
Eosinophils Absolute: 0.1 10*3/uL (ref 0.0–0.5)
HCT: 33.5 % — ABNORMAL LOW (ref 34.8–46.6)
HGB: 10.9 g/dL — ABNORMAL LOW (ref 11.6–15.9)
LYMPH#: 0.7 10*3/uL — AB (ref 0.9–3.3)
LYMPH%: 5.8 % — ABNORMAL LOW (ref 14.0–49.7)
MCH: 29.6 pg (ref 25.1–34.0)
MCHC: 32.5 g/dL (ref 31.5–36.0)
MCV: 91 fL (ref 79.5–101.0)
MONO#: 0.4 10*3/uL (ref 0.1–0.9)
MONO%: 3.1 % (ref 0.0–14.0)
NEUT#: 11.2 10*3/uL — ABNORMAL HIGH (ref 1.5–6.5)
NEUT%: 89.8 % — ABNORMAL HIGH (ref 38.4–76.8)
Platelets: 504 10*3/uL — ABNORMAL HIGH (ref 145–400)
RBC: 3.68 10*6/uL — ABNORMAL LOW (ref 3.70–5.45)
RDW: 19.5 % — AB (ref 11.2–14.5)
WBC: 12.5 10*3/uL — ABNORMAL HIGH (ref 3.9–10.3)
nRBC: 0 % (ref 0–0)

## 2013-10-07 ENCOUNTER — Ambulatory Visit (INDEPENDENT_AMBULATORY_CARE_PROVIDER_SITE_OTHER): Payer: Medicare Other | Admitting: Emergency Medicine

## 2013-10-07 ENCOUNTER — Encounter: Payer: Self-pay | Admitting: Emergency Medicine

## 2013-10-07 VITALS — BP 130/64 | HR 76 | Ht 63.0 in | Wt 162.0 lb

## 2013-10-07 DIAGNOSIS — J4489 Other specified chronic obstructive pulmonary disease: Secondary | ICD-10-CM

## 2013-10-07 DIAGNOSIS — G4733 Obstructive sleep apnea (adult) (pediatric): Secondary | ICD-10-CM

## 2013-10-07 DIAGNOSIS — J449 Chronic obstructive pulmonary disease, unspecified: Secondary | ICD-10-CM

## 2013-10-07 DIAGNOSIS — C3490 Malignant neoplasm of unspecified part of unspecified bronchus or lung: Secondary | ICD-10-CM

## 2013-10-07 DIAGNOSIS — Z9989 Dependence on other enabling machines and devices: Secondary | ICD-10-CM

## 2013-10-07 MED ORDER — FLUTICASONE-SALMETEROL 250-50 MCG/DOSE IN AEPB: 1.0000 | INHALATION_SPRAY | Freq: Two times a day (BID) | RESPIRATORY_TRACT | Status: AC

## 2013-10-07 NOTE — Assessment & Plan Note (Signed)
-   add advair back to spiriva - albuterol prn - discussed smoking cessation plan - rov 4

## 2013-10-07 NOTE — Assessment & Plan Note (Signed)
On chemo and due for CT scan in 2 weeks. Following w Dr Julien Nordmann.

## 2013-10-07 NOTE — Assessment & Plan Note (Signed)
Continue CPAP 13

## 2013-10-07 NOTE — Addendum Note (Signed)
Addended by: Carlos American A on: 10/07/2013 09:37 AM   Modules accepted: Orders

## 2013-10-07 NOTE — Patient Instructions (Signed)
Continue your Spiriva daily Please restart your advair twice a day. Rinse your mouth after using Use ProAir as needed Congratulations on decreasing your cigarettes! Stay at 14 cigarettes a day until we follow up Continue to follow with Dr Julien Nordmann.  Continue CPAP every night Follow with Dr Lamonte Sakai in 4 months or sooner if you have any problems.

## 2013-10-07 NOTE — Progress Notes (Signed)
Subjective:    Patient ID: Jessica Rollins, female    DOB: April 16, 1956, 58 y.o.   MRN: [id number]  HPI 58 yo active smoker, hx of COPD, DM, OSA on CPAP, ILD (by VATS bx, on Imuran / Pred per Allie Dimmer). Admitted end of January '15 to [facility] for AE-COPD and LLL CAP. During that hospitalization she had CT scan that showed B nodules concerning for malignancy. There was some discussion about possible EBUS + ENB for diagnosis, but a R supraclavicular node was successfully bx'd and showed SCLCA. She was discharged from [place] on . She was seen by Dr Julien Nordmann on 2/23 and will start chemo next week. She reports that she has been coughing, has had epistaxis and has coughed up some dark old blood. She is having nausea. Has had some hypotension,   R supraclavicular nodal Bx 08/18/13 >> SCLCA  ROV 10/07/13 -- follows for tobacco, OSA, ILD, new dx SCLCA. She has seen Dr Julien Nordmann and is on carboplatin, etoposide. She is due for a CT scan in 2 weeks. She is fatigued but is otherwise doing OK. She has cut her cigarettes down to 14 a day (we had agreed on 17 a day). She has been off Advair for a month - has been coughing more, feels that she misses it. Uses ProAir a couple times a day. She coughs up- mucous at night and in the am when she wakes.   Review of Systems  Constitutional: Negative for fever and unexpected weight change.  HENT: Positive for congestion. Negative for dental problem, ear pain, nosebleeds, postnasal drip, rhinorrhea, sinus pressure, sneezing, sore throat and trouble swallowing.   Eyes: Negative for redness and itching.  Respiratory: Positive for cough, chest tightness, shortness of breath and wheezing.   Cardiovascular: Positive for leg swelling. Negative for palpitations.       Right ankle  Gastrointestinal: Negative for nausea and vomiting.  Genitourinary: Negative for dysuria.  Musculoskeletal: Negative for joint swelling.  Skin: Negative for rash.  Neurological: Positive for headaches.   Hematological: Bruises/bleeds easily.  Psychiatric/Behavioral: Positive for dysphoric mood. The patient is nervous/anxious.        Objective:   Physical Exam Filed Vitals:   10/07/13 0908 10/07/13 0909  BP:  130/64  Pulse:  76  Height: 5\' 3"  (1.6 m)   Weight: 162 lb (73.483 kg)   SpO2:  97%   Gen: Pleasant, well-nourished, in no distress,  normal affect  ENT: No lesions,  mouth clear,  oropharynx clear, no postnasal drip  Neck: No JVD, no TMG, no carotid bruits  Lungs: No use of accessory muscles, no dullness to percussion, clear without rales or rhonchi  Cardiovascular: RRR, heart sounds normal, no murmur or gallops, no peripheral edema  Musculoskeletal: No deformities, no cyanosis or clubbing  Neuro: alert, non focal  Skin: Warm, no lesions or rashes   CT chest 08/07/13 --  COMPARISON: 08/07/2013 radiograph  FINDINGS:  Scattered atherosclerosis of the aorta and branch vessels. Normal  caliber. Pulmonary arterial vessels are patent centrally. The bolus  timing is not optimized to evaluate the more peripheral vessels.  Heart size within normal limits. Coronary artery calcification.  Mediastinal and right greater than left hilar lymphadenopathy. As  index, a right paratracheal conglomerate measuring 2.9 x 3.9 cm on  image 19 of series 2 and a right hilar node measuring 2.5 cm on  image 31. 1.0 cm short axis node medial to the left common carotid  artery on image 12. Left  hilar nodes measure sub cm short axis  however indeterminate.  Upper abdominal images show a nodular liver contour which may  reflect underlying cirrhosis. Small hiatal hernia.  There is a 1.9 x 2.2 cm nodule within the right upper lobe  anteriorly on image 28 series 3 with spiculated margins. Just  superior to this, there is a 0.7 cm satellite nodule on image 25.  Background emphysema and peripheral reticular opacities. Patchy  right upper lobe airspace opacities are nonspecific. Bilateral lower   lobe consolidations. Clustered nodular opacities within the right  lower lobe measuring up to 1.2 cm on image 38 of series 3. Left  upper lobe paramediastinal 0.9 cm nodule on image 24. Ground-glass  opacity left upper lobe nodule subpleural measuring 0.7 cm on image  18.  The central airways are patent the some of the lower lobe  bronchioles appear impacted. No pneumothorax.  No acute osseous finding.  IMPRESSION:  2.2 cm right upper lobe nodule is suspicious for a primary lung  cancer. There is a 0.7 cm satellite nodule. Marked mediastinal and  right hilar lymphadenopathy. Left hilar lymph nodes are  indeterminate.  Bilateral lower lobe airspace opacities may reflect pneumonia or  aspiration. Background emphysema and peripheral reticular opacities.  Indeterminate ground-glass opacity nodule within the left lung  measuring 0.7 cm and nodular opacities within left upper lobe  paramediastinal and right lower lobe, which may be infectious or  malignant  PET scan 09/10/13 --  COMPARISON: CT CHEST W/CM dated 08/07/2013  FINDINGS:  NECK  Low right cervical/ supraclavicular hypermetabolic adenopathy. An  index right supraclavicular node measures 1.6 cm and a S.U.V. max of  6.3 on image 36.  CHEST  A left thoracic inlet node measures 1.0 cm and a S.U.V. max of 3.9  on image 40. Right paratracheal nodal mass of 3.1 x 4.0 cm. This  measures a S.U.V. max of the 8.8.  Anterior right upper lobe lung nodule measures 2.8 cm and a S.U.V.  max of 9.5 on image 66. A right lower lobe nodule measures 1.2 cm  and a S.U.V. max of 2.6 on image 78.  Hypermetabolic prevascular and right hilar adenopathy as well.  ABDOMEN/PELVIS  No areas of abnormal hypermetabolism.  SKELETON  Multi focal osseous metastasis. Index right humeral head lesion  which measures a S.U.V. max of 4.2 on image 32. A likely subtly  sclerotic right femoral neck lesion measures a S.U.V. max of 4.7 on  image 181.  CT IMAGES  PERFORMED FOR ATTENUATION CORRECTION  Aortic and coronary artery atherosclerosis. Pulmonary fibrosis.  Mild cirrhosis. A small hiatal hernia. Normal adrenal glands.  Hysterectomy.  IMPRESSION:  1. Nodal metastasis within the chest and lower neck. Right upper and  right lower lobe lung lesions which could represent primary or  primaries.  2. Osseous metastasis.  3. Cirrhosis.  4. Age advanced coronary artery atherosclerosis      Assessment & Plan:  Small cell lung carcinoma On chemo and due for CT scan in 2 weeks. Following w Dr Julien Nordmann.   COPD (chronic obstructive pulmonary disease) - add advair back to spiriva - albuterol prn - discussed smoking cessation plan - rov 4  OSA on CPAP Continue CPAP 13

## 2013-10-13 ENCOUNTER — Other Ambulatory Visit (HOSPITAL_BASED_OUTPATIENT_CLINIC_OR_DEPARTMENT_OTHER): Payer: Medicare Other

## 2013-10-13 DIAGNOSIS — C7A1 Malignant poorly differentiated neuroendocrine tumors: Secondary | ICD-10-CM

## 2013-10-13 DIAGNOSIS — C3490 Malignant neoplasm of unspecified part of unspecified bronchus or lung: Secondary | ICD-10-CM

## 2013-10-13 LAB — COMPREHENSIVE METABOLIC PANEL (CC13)
ALBUMIN: 3.5 g/dL (ref 3.5–5.0)
ALK PHOS: 161 U/L — AB (ref 40–150)
ALT: 8 U/L (ref 0–55)
AST: 10 U/L (ref 5–34)
Anion Gap: 12 mEq/L — ABNORMAL HIGH (ref 3–11)
BILIRUBIN TOTAL: 0.37 mg/dL (ref 0.20–1.20)
BUN: 11.4 mg/dL (ref 7.0–26.0)
CO2: 23 mEq/L (ref 22–29)
CREATININE: 0.6 mg/dL (ref 0.6–1.1)
Calcium: 9.2 mg/dL (ref 8.4–10.4)
Chloride: 107 mEq/L (ref 98–109)
Glucose: 244 mg/dl — ABNORMAL HIGH (ref 70–140)
Potassium: 4.3 mEq/L (ref 3.5–5.1)
Sodium: 142 mEq/L (ref 136–145)
Total Protein: 6.5 g/dL (ref 6.4–8.3)

## 2013-10-13 LAB — CBC WITH DIFFERENTIAL/PLATELET
BASO%: 0.6 % (ref 0.0–2.0)
BASOS ABS: 0 10*3/uL (ref 0.0–0.1)
EOS%: 0.9 % (ref 0.0–7.0)
Eosinophils Absolute: 0.1 10*3/uL (ref 0.0–0.5)
HCT: 34.1 % — ABNORMAL LOW (ref 34.8–46.6)
HEMOGLOBIN: 11.1 g/dL — AB (ref 11.6–15.9)
LYMPH%: 9.8 % — AB (ref 14.0–49.7)
MCH: 29.9 pg (ref 25.1–34.0)
MCHC: 32.4 g/dL (ref 31.5–36.0)
MCV: 92.4 fL (ref 79.5–101.0)
MONO#: 0.4 10*3/uL (ref 0.1–0.9)
MONO%: 5.7 % (ref 0.0–14.0)
NEUT#: 6.1 10*3/uL (ref 1.5–6.5)
NEUT%: 83 % — ABNORMAL HIGH (ref 38.4–76.8)
PLATELETS: 157 10*3/uL (ref 145–400)
RBC: 3.69 10*6/uL — ABNORMAL LOW (ref 3.70–5.45)
RDW: 21.3 % — AB (ref 11.2–14.5)
WBC: 7.3 10*3/uL (ref 3.9–10.3)
lymph#: 0.7 10*3/uL — ABNORMAL LOW (ref 0.9–3.3)

## 2013-10-17 ENCOUNTER — Ambulatory Visit (HOSPITAL_COMMUNITY)
Admission: RE | Admit: 2013-10-17 | Discharge: 2013-10-17 | Disposition: A | Discharge status: Home / Self Care | Payer: Medicare Other | Source: Ambulatory Visit | Attending: Physician Assistant | Admitting: Physician Assistant

## 2013-10-17 DIAGNOSIS — J8410 Pulmonary fibrosis, unspecified: Secondary | ICD-10-CM | POA: Insufficient documentation

## 2013-10-17 DIAGNOSIS — R0602 Shortness of breath: Secondary | ICD-10-CM | POA: Insufficient documentation

## 2013-10-17 DIAGNOSIS — I2510 Atherosclerotic heart disease of native coronary artery without angina pectoris: Secondary | ICD-10-CM | POA: Insufficient documentation

## 2013-10-17 DIAGNOSIS — C7951 Secondary malignant neoplasm of bone: Secondary | ICD-10-CM | POA: Insufficient documentation

## 2013-10-17 DIAGNOSIS — F17200 Nicotine dependence, unspecified, uncomplicated: Secondary | ICD-10-CM | POA: Insufficient documentation

## 2013-10-17 DIAGNOSIS — R599 Enlarged lymph nodes, unspecified: Secondary | ICD-10-CM | POA: Insufficient documentation

## 2013-10-17 DIAGNOSIS — Z9221 Personal history of antineoplastic chemotherapy: Secondary | ICD-10-CM | POA: Insufficient documentation

## 2013-10-17 DIAGNOSIS — I10 Essential (primary) hypertension: Secondary | ICD-10-CM | POA: Insufficient documentation

## 2013-10-17 DIAGNOSIS — K5730 Diverticulosis of large intestine without perforation or abscess without bleeding: Secondary | ICD-10-CM | POA: Insufficient documentation

## 2013-10-17 DIAGNOSIS — C787 Secondary malignant neoplasm of liver and intrahepatic bile duct: Secondary | ICD-10-CM | POA: Insufficient documentation

## 2013-10-17 DIAGNOSIS — J4489 Other specified chronic obstructive pulmonary disease: Secondary | ICD-10-CM | POA: Insufficient documentation

## 2013-10-17 DIAGNOSIS — J449 Chronic obstructive pulmonary disease, unspecified: Secondary | ICD-10-CM | POA: Insufficient documentation

## 2013-10-17 DIAGNOSIS — C3490 Malignant neoplasm of unspecified part of unspecified bronchus or lung: Secondary | ICD-10-CM

## 2013-10-17 DIAGNOSIS — E119 Type 2 diabetes mellitus without complications: Secondary | ICD-10-CM | POA: Insufficient documentation

## 2013-10-17 DIAGNOSIS — K8020 Calculus of gallbladder without cholecystitis without obstruction: Secondary | ICD-10-CM | POA: Insufficient documentation

## 2013-10-17 DIAGNOSIS — K7689 Other specified diseases of liver: Secondary | ICD-10-CM | POA: Insufficient documentation

## 2013-10-17 DIAGNOSIS — I700 Atherosclerosis of aorta: Secondary | ICD-10-CM | POA: Insufficient documentation

## 2013-10-17 DIAGNOSIS — C7952 Secondary malignant neoplasm of bone marrow: Secondary | ICD-10-CM

## 2013-10-17 MED ORDER — IOHEXOL 300 MG/ML  SOLN
100.0000 mL | Freq: Once | INTRAMUSCULAR | Status: AC | PRN
  Administered 2013-10-17: 100 mL via INTRAVENOUS

## 2013-10-20 ENCOUNTER — Other Ambulatory Visit (HOSPITAL_BASED_OUTPATIENT_CLINIC_OR_DEPARTMENT_OTHER): Payer: Medicare Other

## 2013-10-20 ENCOUNTER — Encounter: Payer: Self-pay | Admitting: Internal Medicine

## 2013-10-20 ENCOUNTER — Telehealth: Payer: Self-pay | Admitting: *Deleted

## 2013-10-20 ENCOUNTER — Ambulatory Visit (HOSPITAL_BASED_OUTPATIENT_CLINIC_OR_DEPARTMENT_OTHER): Payer: Medicare Other

## 2013-10-20 ENCOUNTER — Ambulatory Visit (HOSPITAL_BASED_OUTPATIENT_CLINIC_OR_DEPARTMENT_OTHER): Payer: Medicare Other | Admitting: Internal Medicine

## 2013-10-20 ENCOUNTER — Other Ambulatory Visit: Payer: Medicare Other

## 2013-10-20 ENCOUNTER — Telehealth: Payer: Self-pay | Admitting: Internal Medicine

## 2013-10-20 VITALS — BP 123/42 | HR 81 | Temp 98.0°F | Resp 17 | Ht 63.0 in | Wt 160.9 lb

## 2013-10-20 DIAGNOSIS — C787 Secondary malignant neoplasm of liver and intrahepatic bile duct: Secondary | ICD-10-CM

## 2013-10-20 DIAGNOSIS — C3490 Malignant neoplasm of unspecified part of unspecified bronchus or lung: Secondary | ICD-10-CM

## 2013-10-20 DIAGNOSIS — C7A1 Malignant poorly differentiated neuroendocrine tumors: Secondary | ICD-10-CM

## 2013-10-20 DIAGNOSIS — R0609 Other forms of dyspnea: Secondary | ICD-10-CM

## 2013-10-20 DIAGNOSIS — C7B8 Other secondary neuroendocrine tumors: Secondary | ICD-10-CM

## 2013-10-20 DIAGNOSIS — Z5111 Encounter for antineoplastic chemotherapy: Secondary | ICD-10-CM

## 2013-10-20 DIAGNOSIS — R0989 Other specified symptoms and signs involving the circulatory and respiratory systems: Secondary | ICD-10-CM

## 2013-10-20 LAB — COMPREHENSIVE METABOLIC PANEL (CC13)
ALT: 7 U/L (ref 0–55)
ANION GAP: 11 meq/L (ref 3–11)
AST: 11 U/L (ref 5–34)
Albumin: 3.5 g/dL (ref 3.5–5.0)
Alkaline Phosphatase: 140 U/L (ref 40–150)
BUN: 14.1 mg/dL (ref 7.0–26.0)
CO2: 23 meq/L (ref 22–29)
CREATININE: 0.7 mg/dL (ref 0.6–1.1)
Calcium: 9.2 mg/dL (ref 8.4–10.4)
Chloride: 105 mEq/L (ref 98–109)
GLUCOSE: 275 mg/dL — AB (ref 70–140)
Potassium: 4.2 mEq/L (ref 3.5–5.1)
Sodium: 139 mEq/L (ref 136–145)
TOTAL PROTEIN: 6.6 g/dL (ref 6.4–8.3)
Total Bilirubin: 0.34 mg/dL (ref 0.20–1.20)

## 2013-10-20 LAB — CBC WITH DIFFERENTIAL/PLATELET
BASO%: 0.5 % (ref 0.0–2.0)
Basophils Absolute: 0.1 10*3/uL (ref 0.0–0.1)
EOS ABS: 0.1 10*3/uL (ref 0.0–0.5)
EOS%: 0.6 % (ref 0.0–7.0)
HEMATOCRIT: 36.1 % (ref 34.8–46.6)
HGB: 11.7 g/dL (ref 11.6–15.9)
LYMPH%: 3.6 % — AB (ref 14.0–49.7)
MCH: 29.9 pg (ref 25.1–34.0)
MCHC: 32.3 g/dL (ref 31.5–36.0)
MCV: 92.8 fL (ref 79.5–101.0)
MONO#: 0.4 10*3/uL (ref 0.1–0.9)
MONO%: 3.2 % (ref 0.0–14.0)
NEUT%: 92.1 % — AB (ref 38.4–76.8)
NEUTROS ABS: 11.1 10*3/uL — AB (ref 1.5–6.5)
PLATELETS: 295 10*3/uL (ref 145–400)
RBC: 3.89 10*6/uL (ref 3.70–5.45)
RDW: 21.4 % — ABNORMAL HIGH (ref 11.2–14.5)
WBC: 12.1 10*3/uL — ABNORMAL HIGH (ref 3.9–10.3)
lymph#: 0.4 10*3/uL — ABNORMAL LOW (ref 0.9–3.3)

## 2013-10-20 MED ORDER — SODIUM CHLORIDE 0.9 % IV SOLN
Freq: Once | INTRAVENOUS | Status: AC
  Administered 2013-10-20: 12:00:00 via INTRAVENOUS

## 2013-10-20 MED ORDER — ONDANSETRON 16 MG/50ML IVPB (CHCC)
16.0000 mg | Freq: Once | INTRAVENOUS | Status: AC
  Administered 2013-10-20: 16 mg via INTRAVENOUS

## 2013-10-20 MED ORDER — SODIUM CHLORIDE 0.9 % IV SOLN
100.0000 mg/m2 | Freq: Once | INTRAVENOUS | Status: AC
  Administered 2013-10-20: 170 mg via INTRAVENOUS
  Filled 2013-10-20: qty 8.5

## 2013-10-20 MED ORDER — CARBOPLATIN CHEMO INJECTION 600 MG/60ML
534.0000 mg | Freq: Once | INTRAVENOUS | Status: AC
  Administered 2013-10-20: 530 mg via INTRAVENOUS
  Filled 2013-10-20: qty 53

## 2013-10-20 MED ORDER — DEXAMETHASONE SODIUM PHOSPHATE 20 MG/5ML IJ SOLN
20.0000 mg | Freq: Once | INTRAMUSCULAR | Status: AC
  Administered 2013-10-20: 20 mg via INTRAVENOUS

## 2013-10-20 MED ORDER — ONDANSETRON 16 MG/50ML IVPB (CHCC)
INTRAVENOUS | Status: AC
  Filled 2013-10-20: qty 16

## 2013-10-20 MED ORDER — DEXAMETHASONE SODIUM PHOSPHATE 20 MG/5ML IJ SOLN
INTRAMUSCULAR | Status: AC
  Filled 2013-10-20: qty 5

## 2013-10-20 NOTE — Telephone Encounter (Signed)
Per staff message and POF I have scheduled appts.  JMW  

## 2013-10-20 NOTE — Patient Instructions (Signed)
Smoking Cessation Quitting smoking is important to your health and has many advantages. However, it is not always easy to quit since nicotine is a very addictive drug. Often times, people try 3 times or more before being able to quit. This document explains the best ways for you to prepare to quit smoking. Quitting takes hard work and a lot of effort, but you can do it. ADVANTAGES OF QUITTING SMOKING  You will live longer, feel better, and live better.  Your body will feel the impact of quitting smoking almost immediately.  Within 20 minutes, blood pressure decreases. Your pulse returns to its normal level.  After 8 hours, carbon monoxide levels in the blood return to normal. Your oxygen level increases.  After 24 hours, the chance of having a heart attack starts to decrease. Your breath, hair, and body stop smelling like smoke.  After 48 hours, damaged nerve endings begin to recover. Your sense of taste and smell improve.  After 72 hours, the body is virtually free of nicotine. Your bronchial tubes relax and breathing becomes easier.  After 2 to 12 weeks, lungs can hold more air. Exercise becomes easier and circulation improves.  The risk of having a heart attack, stroke, cancer, or lung disease is greatly reduced.  After 1 year, the risk of coronary heart disease is cut in half.  After 5 years, the risk of stroke falls to the same as a nonsmoker.  After 10 years, the risk of lung cancer is cut in half and the risk of other cancers decreases significantly.  After 15 years, the risk of coronary heart disease drops, usually to the level of a nonsmoker.  If you are pregnant, quitting smoking will improve your chances of having a healthy baby.  The people you live with, especially any children, will be healthier.  You will have extra money to spend on things other than cigarettes. QUESTIONS TO THINK ABOUT BEFORE ATTEMPTING TO QUIT You may want to talk about your answers with your  caregiver.  Why do you want to quit?  If you tried to quit in the past, what helped and what did not?  What will be the most difficult situations for you after you quit? How will you plan to handle them?  Who can help you through the tough times? Your family? Friends? A caregiver?  What pleasures do you get from smoking? What ways can you still get pleasure if you quit? Here are some questions to ask your caregiver:  How can you help me to be successful at quitting?  What medicine do you think would be best for me and how should I take it?  What should I do if I need more help?  What is smoking withdrawal like? How can I get information on withdrawal? GET READY  Set a quit date.  Change your environment by getting rid of all cigarettes, ashtrays, matches, and lighters in your home, car, or work. Do not let people smoke in your home.  Review your past attempts to quit. Think about what worked and what did not. GET SUPPORT AND ENCOURAGEMENT You have a better chance of being successful if you have help. You can get support in many ways.  Tell your family, friends, and co-workers that you are going to quit and need their support. Ask them not to smoke around you.  Get individual, group, or telephone counseling and support. Programs are available at local hospitals and health centers. Call your local health department for   information about programs in your area.  Spiritual beliefs and practices may help some smokers quit.  Download a "quit meter" on your computer to keep track of quit statistics, such as how long you have gone without smoking, cigarettes not smoked, and money saved.  Get a self-help book about quitting smoking and staying off of tobacco. LEARN NEW SKILLS AND BEHAVIORS  Distract yourself from urges to smoke. Talk to someone, go for a walk, or occupy your time with a task.  Change your normal routine. Take a different route to work. Drink tea instead of coffee.  Eat breakfast in a different place.  Reduce your stress. Take a hot bath, exercise, or read a book.  Plan something enjoyable to do every day. Reward yourself for not smoking.  Explore interactive web-based programs that specialize in helping you quit. GET MEDICINE AND USE IT CORRECTLY Medicines can help you stop smoking and decrease the urge to smoke. Combining medicine with the above behavioral methods and support can greatly increase your chances of successfully quitting smoking.  Nicotine replacement therapy helps deliver nicotine to your body without the negative effects and risks of smoking. Nicotine replacement therapy includes nicotine gum, lozenges, inhalers, nasal sprays, and skin patches. Some may be available over-the-counter and others require a prescription.  Antidepressant medicine helps people abstain from smoking, but how this works is unknown. This medicine is available by prescription.  Nicotinic receptor partial agonist medicine simulates the effect of nicotine in your brain. This medicine is available by prescription. Ask your caregiver for advice about which medicines to use and how to use them based on your health history. Your caregiver will tell you what side effects to look out for if you choose to be on a medicine or therapy. Carefully read the information on the package. Do not use any other product containing nicotine while using a nicotine replacement product.  RELAPSE OR DIFFICULT SITUATIONS Most relapses occur within the first 3 months after quitting. Do not be discouraged if you start smoking again. Remember, most people try several times before finally quitting. You may have symptoms of withdrawal because your body is used to nicotine. You may crave cigarettes, be irritable, feel very hungry, cough often, get headaches, or have difficulty concentrating. The withdrawal symptoms are only temporary. They are strongest when you first quit, but they will go away within  10 14 days. To reduce the chances of relapse, try to:  Avoid drinking alcohol. Drinking lowers your chances of successfully quitting.  Reduce the amount of caffeine you consume. Once you quit smoking, the amount of caffeine in your body increases and can give you symptoms, such as a rapid heartbeat, sweating, and anxiety.  Avoid smokers because they can make you want to smoke.  Do not let weight gain distract you. Many smokers will gain weight when they quit, usually less than 10 pounds. Eat a healthy diet and stay active. You can always lose the weight gained after you quit.  Find ways to improve your mood other than smoking. FOR MORE INFORMATION  www.smokefree.gov  Document Released: 06/20/2001 Document Revised: 12/26/2011 Document Reviewed: 10/05/2011 [employer] Patient Information 2014 [employer].  

## 2013-10-20 NOTE — Patient Instructions (Signed)
[facility] Discharge Instructions for Patients Receiving Chemotherapy  Today you received the following chemotherapy agents: Carboplatin, Etoposide  To help prevent nausea and vomiting after your treatment, we encourage you to take your nausea medication: Compazine 10 mg every 6 hrs as needed.    If you develop nausea and vomiting that is not controlled by your nausea medication, call the clinic.   BELOW ARE SYMPTOMS THAT SHOULD BE REPORTED IMMEDIATELY:  *FEVER GREATER THAN 100.5 F  *CHILLS WITH OR WITHOUT FEVER  NAUSEA AND VOMITING THAT IS NOT CONTROLLED WITH YOUR NAUSEA MEDICATION  *UNUSUAL SHORTNESS OF BREATH  *UNUSUAL BRUISING OR BLEEDING  TENDERNESS IN MOUTH AND THROAT WITH OR WITHOUT PRESENCE OF ULCERS  *URINARY PROBLEMS  *BOWEL PROBLEMS  UNUSUAL RASH Items with * indicate a potential emergency and should be followed up as soon as possible.  Feel free to call the clinic you have any questions or concerns. The clinic phone number is [phone].

## 2013-10-20 NOTE — Telephone Encounter (Signed)
Gave pt appt for lab,md and chemo for April , emailed [facility] regarding May appt

## 2013-10-20 NOTE — Progress Notes (Signed)
[place] [phone]   [phone]) [phone]  OFFICE PROGRESS NOTE  Jessica Rollins Jessica Rollins., MD [address] [place] [address]  DIAGNOSIS: Extensive stage small cell lung cancer diagnosed in February of 2015.  PRIOR THERAPY: None  CURRENT THERAPY: Systemic chemotherapy with carboplatin for AUC of 5 on day 1 and etoposide 100 mg/M2 on days 1, 2 and 3 with Neulasta support on day 4. She is status post 2 cycles.   INTERVAL HISTORY: Jessica Rollins 58 y.o. female returns to the clinic today for followup visit accompanied by her son. The patient tolerated the second cycle of her systemic chemotherapy fairly well with no significant adverse effects. She denied having any significant fever or chills. She denied having any chest pain but continues to have the baseline shortness of breath and she is currently on home oxygen, no cough or hemoptysis. She has no significant weight loss or night sweats. She had repeat CT scan of the chest, abdomen and pelvis performed recently and she is here for evaluation and discussion of her scan results.  MEDICAL HISTORY: Past Medical History  Diagnosis Date  . COPD (chronic obstructive pulmonary disease)   . Pulmonary fibrosis     diagnosed with this 2001.    . Diabetes mellitus without complication   . Hiatal hernia   . Depression   . Anxiety   . High cholesterol   . Hypertension   . Osteoporosis   . OSA on CPAP     13cm  . Overactive bladder   . Heart murmur     ALLERGIES:  has No Known Allergies.  MEDICATIONS:  Current Outpatient Prescriptions  Medication Sig Dispense Refill  . ADVAIR DISKUS 250-50 MCG/DOSE AEPB 1 puff 2 (two) times daily.       Marland Kitchen albuterol (PROVENTIL) (2.5 MG/3ML) 0.083% nebulizer solution Take 3 mLs (2.5 mg total) by nebulization every 2 (two) hours as needed for wheezing or shortness of breath.  75 mL  12  . aspirin 325 MG tablet Take 325 mg by mouth daily.      Marland Kitchen azaTHIOprine  (IMURAN) 50 MG tablet Take 50 mg by mouth 2 (two) times daily.      . calcium citrate-vitamin D (CITRACAL+D) 315-200 MG-UNIT per tablet Take 1 tablet by mouth daily.      . Cimetidine (TAGAMET PO) Take 1 tablet by mouth daily.      . cyanocobalamin 1000 MCG tablet Take 100 mcg by mouth daily.      Marland Kitchen esomeprazole (NEXIUM) 40 MG capsule Take 40 mg by mouth daily before breakfast.      . Fluticasone-Salmeterol (ADVAIR DISKUS) 250-50 MCG/DOSE AEPB Inhale 1 puff into the lungs 2 (two) times daily.  60 each  5  . folic acid (FOLVITE) 1 MG tablet Take 1 mg by mouth 2 (two) times daily.      Marland Kitchen guaiFENesin (MUCINEX) 600 MG 12 hr tablet Take 600 mg by mouth 2 (two) times daily.      . IRON PO Take 1 tablet by mouth 4 (four) times daily.      Marland Kitchen lisinopril (PRINIVIL,ZESTRIL) 10 MG tablet Take 10 mg by mouth daily.       Marland Kitchen LORazepam (ATIVAN) 1 MG tablet Take 1 mg by mouth at bedtime.      . metFORMIN (GLUCOPHAGE) 500 MG tablet Take by mouth 2 (two) times daily with a meal.      . Multiple Vitamin (MULTIVITAMIN WITH MINERALS)  TABS tablet Take 1 tablet by mouth daily.      . niacin 500 MG tablet Take 500 mg by mouth daily.      . Omega-3 Fatty Acids (FISH OIL) 300 MG CAPS Take 1 capsule by mouth daily.      Marland Kitchen oxybutynin (DITROPAN-XL) 10 MG 24 hr tablet Take 10 mg by mouth daily.      Marland Kitchen PARoxetine (PAXIL) 30 MG tablet Take 30 mg by mouth daily.      . predniSONE (DELTASONE) 10 MG tablet Take 10 mg by mouth daily with breakfast.       . prochlorperazine (COMPAZINE) 10 MG tablet Take 1 tablet (10 mg total) by mouth every 6 (six) hours as needed for nausea or vomiting.  60 tablet  0  . rosuvastatin (CRESTOR) 10 MG tablet Take 10 mg by mouth daily.      Marland Kitchen SPIRIVA HANDIHALER 18 MCG inhalation capsule Place 18 mcg into inhaler and inhale daily.       . vitamin E 400 UNIT capsule Take 400 Units by mouth daily.       No current facility-administered medications for this visit.    SURGICAL HISTORY:  Past Surgical  History  Procedure Laterality Date  . Bladder suspension    . Abdominal hysterectomy      severe bleeding-when she was in her 30's  . Lung biopsy      REVIEW OF SYSTEMS:  Constitutional: positive for fatigue Eyes: negative Ears, nose, mouth, throat, and face: negative Respiratory: positive for dyspnea on exertion Cardiovascular: negative Gastrointestinal: negative Genitourinary:negative Integument/breast: negative Hematologic/lymphatic: negative Musculoskeletal:negative Neurological: negative Behavioral/Psych: negative Endocrine: negative Allergic/Immunologic: negative   PHYSICAL EXAMINATION: General appearance: alert, cooperative, fatigued and no distress Head: Normocephalic, without obvious abnormality, atraumatic Neck: no adenopathy, no JVD, supple, symmetrical, trachea midline and thyroid not enlarged, symmetric, no tenderness/mass/nodules Lymph nodes: Cervical, supraclavicular, and axillary nodes normal. Resp: clear to auscultation bilaterally Back: symmetric, no curvature. ROM normal. No CVA tenderness. Cardio: regular rate and rhythm, S1, S2 normal, no murmur, click, rub or gallop GI: soft, non-tender; bowel sounds normal; no masses,  no organomegaly Extremities: extremities normal, atraumatic, no cyanosis or edema Neurologic: Alert and oriented X 3, normal strength and tone. Normal symmetric reflexes. Normal coordination and gait  ECOG PERFORMANCE STATUS: 1 - Symptomatic but completely ambulatory  Blood pressure 123/42, pulse 81, temperature 98 F (36.7 C), temperature source Oral, resp. rate 17, height 5\' 3"  (1.6 m), weight 160 lb 14.4 oz (72.984 kg), SpO2 98.00%, peak flow 3 L/min.  LABORATORY DATA: Lab Results  Component Value Date   WBC 12.1* 10/20/2013   HGB 11.7 10/20/2013   HCT 36.1 10/20/2013   MCV 92.8 10/20/2013   PLT 295 10/20/2013      Chemistry      Component Value Date/Time   NA 142 10/13/2013 1040   NA 137 08/18/2013 0742   K 4.3 10/13/2013 1040   K  4.5 08/18/2013 0742   CL 95* 08/18/2013 0742   CO2 23 10/13/2013 1040   CO2 31 08/18/2013 0742   BUN 11.4 10/13/2013 1040   BUN 23 08/18/2013 0742   CREATININE 0.6 10/13/2013 1040   CREATININE 0.40* 08/18/2013 0742      Component Value Date/Time   CALCIUM 9.2 10/13/2013 1040   CALCIUM 9.2 08/18/2013 0742   ALKPHOS 161* 10/13/2013 1040   ALKPHOS 104 08/12/2013 0721   AST 10 10/13/2013 1040   AST 16 08/12/2013 0721   ALT 8 10/13/2013 1040  ALT 16 08/12/2013 0721   BILITOT 0.37 10/13/2013 1040   BILITOT 0.3 08/12/2013 9371       RADIOGRAPHIC STUDIES: Ct Chest W Contrast  10/17/2013   CLINICAL DATA:  Lung cancer, ongoing chemotherapy, shortness of breath, history COPD, pulmonary fibrosis, diabetes, hypertension, smoking  EXAM: CT CHEST, ABDOMEN, AND PELVIS WITH CONTRAST  TECHNIQUE: Multidetector CT imaging of the chest, abdomen and pelvis was performed following the standard protocol during bolus administration of intravenous contrast. Sagittal and coronal MPR images reconstructed from axial data set.  CONTRAST:  136mL OMNIPAQUE IOHEXOL 300 MG/ML SOLN IV. Dilute oral contrast.  COMPARISON:  CT chest 08/07/2013, PET-CT 09/10/2013  FINDINGS:   CT CHEST FINDINGS  Thoracic vascular structures patent on nondedicated exam.  Scattered atherosclerotic calcifications aorta and coronary arteries.  Enlarged right paratracheal lymph node 3.9 x 3.0 cm image 22, previously 4.9 x 3.2 cm.  Minimally enlarged right hilar lymph node 11 mm short axis image 24.  Scattered normal size superior mass spinal in right peritracheal nodes.  Subcarinal lymph node 12 mm short axis previously 18 mm.  Emphysematous changes with persistent asymmetric interstitial lung disease changes in right lung consistent with fibrosis.  Interval decrease in size of right upper lobe mass, 11 x 9 mm image 26, previously 22 x 19 mm.  Vague left upper lobe nodule 4 mm image 16 previously 7 mm.  9 mm left upper lobe nodule seen previously posteriorly near major fissure no  longer identified.  11 x 7 mm right lower lobe nodule image 37 little changed.  Improved bibasilar infiltrates.  No pleural effusion, acute infiltrate, or pneumothorax.  No definite new pulmonary mass/ nodule.  Widespread osseous metastatic disease identified, question new lesions versus sclerosis at treated metastases.    CT ABDOMEN AND PELVIS FINDINGS  Multiple poorly defined low-attenuation hepatic lesions consistent with metastases, new since 08/07/2013.  Lateral segment left lobe liver lesion 19 x 17 mm image 55.  Posterior right lobe liver lesion 20 x 15 mm image 59.  Spleen, pancreas, kidneys, and adrenal glands normal.  Normal appendix.  Cholelithiasis.  Minimal sigmoid diverticulosis.  Uterus surgically absent with nonvisualization of ovaries.  Stomach and bowel loops otherwise unremarkable.  No mass, adenopathy, free fluid or inflammatory process.  Scattered atherosclerotic calcification.  Sclerotic osseous metastatic lesions question new versus treated metastases not seen previously.    IMPRESSION: Widespread osseous metastatic disease.  Interval decrease in size of pulmonary nodules and right thoracic adenopathy since previous exam.  New hepatic metastases.  Cholelithiasis.  Minimal sigmoid diverticulosis.  Pulmonary fibrosis with improved bibasilar infiltrates.   Electronically Signed   By: Lavonia Dana M.D.   On: 10/17/2013 14:43   ASSESSMENT AND PLAN: This is a very pleasant 58 years old white female with extensive stage small cell lung cancer currently undergoing systemic chemotherapy with carboplatin and etoposide status post 2 cycles. She tolerated the first 2 cycles of her treatment fairly well with no significant adverse effects. Her recent CT scan of the chest, abdomen and pelvis showed significant improvement in her disease especially in the chest but there was multiple poorly defined attenuation hepatic lesions concerning for metastasis. I personally reviewed the CT scan images and  results with the patient and her son and some of these liver lesions has been there before and some of these lesions could be more well defined after therapy. In the presence of response in the chest, I recommended for the patient to proceed with 2 more cycles of  her systemic chemotherapy with the same regimen. I will continue to monitor the liver lesion closely on the upcoming scan and this is any worsening of these lesions, I would consider changing her chemotherapy to a different regimen. The patient and her son agreed to the current plan. She would come back for followup visit in 3 weeks with the start of cycle #4. She was advised to call immediately if she has any concerning symptoms in the interval. The patient voices understanding of current disease status and treatment options and is in agreement with the current care plan.  All questions were answered. The patient knows to call the clinic with any problems, questions or concerns. We can certainly see the patient much sooner if necessary.   Disclaimer: This note was dictated with voice recognition software. Similar sounding words can inadvertently be transcribed and may not be corrected upon review.

## 2013-10-20 NOTE — Progress Notes (Signed)
Labs 10/20/13 within treatment parameters. Patient tolerated treatment well.

## 2013-10-21 ENCOUNTER — Ambulatory Visit (HOSPITAL_BASED_OUTPATIENT_CLINIC_OR_DEPARTMENT_OTHER): Payer: Medicare Other

## 2013-10-21 VITALS — BP 113/54 | HR 76 | Temp 98.3°F | Resp 18

## 2013-10-21 DIAGNOSIS — C7B8 Other secondary neuroendocrine tumors: Secondary | ICD-10-CM

## 2013-10-21 DIAGNOSIS — C3490 Malignant neoplasm of unspecified part of unspecified bronchus or lung: Secondary | ICD-10-CM

## 2013-10-21 DIAGNOSIS — Z5111 Encounter for antineoplastic chemotherapy: Secondary | ICD-10-CM

## 2013-10-21 DIAGNOSIS — C787 Secondary malignant neoplasm of liver and intrahepatic bile duct: Secondary | ICD-10-CM

## 2013-10-21 DIAGNOSIS — C7A1 Malignant poorly differentiated neuroendocrine tumors: Secondary | ICD-10-CM

## 2013-10-21 MED ORDER — PROCHLORPERAZINE MALEATE 10 MG PO TABS
10.0000 mg | ORAL_TABLET | Freq: Once | ORAL | Status: AC
  Administered 2013-10-21: 10 mg via ORAL

## 2013-10-21 MED ORDER — ETOPOSIDE CHEMO INJECTION 1 GM/50ML
100.0000 mg/m2 | Freq: Once | INTRAVENOUS | Status: AC
  Administered 2013-10-21: 170 mg via INTRAVENOUS
  Filled 2013-10-21: qty 8.5

## 2013-10-21 MED ORDER — SODIUM CHLORIDE 0.9 % IV SOLN
Freq: Once | INTRAVENOUS | Status: AC
  Administered 2013-10-21: 15:00:00 via INTRAVENOUS

## 2013-10-21 MED ORDER — PROCHLORPERAZINE MALEATE 10 MG PO TABS
ORAL_TABLET | ORAL | Status: AC
  Filled 2013-10-21: qty 1

## 2013-10-21 NOTE — Patient Instructions (Signed)
[facility] Discharge Instructions for Patients Receiving Chemotherapy  Today you received the following chemotherapy agent: Etoposide   To help prevent nausea and vomiting after your treatment, we encourage you to take your nausea medication as prescribed.    If you develop nausea and vomiting that is not controlled by your nausea medication, call the clinic.   BELOW ARE SYMPTOMS THAT SHOULD BE REPORTED IMMEDIATELY:  *FEVER GREATER THAN 100.5 F  *CHILLS WITH OR WITHOUT FEVER  NAUSEA AND VOMITING THAT IS NOT CONTROLLED WITH YOUR NAUSEA MEDICATION  *UNUSUAL SHORTNESS OF BREATH  *UNUSUAL BRUISING OR BLEEDING  TENDERNESS IN MOUTH AND THROAT WITH OR WITHOUT PRESENCE OF ULCERS  *URINARY PROBLEMS  *BOWEL PROBLEMS  UNUSUAL RASH Items with * indicate a potential emergency and should be followed up as soon as possible.  Feel free to call the clinic you have any questions or concerns. The clinic phone number is [phone].

## 2013-10-22 ENCOUNTER — Telehealth: Payer: Self-pay | Admitting: Internal Medicine

## 2013-10-22 ENCOUNTER — Ambulatory Visit (HOSPITAL_BASED_OUTPATIENT_CLINIC_OR_DEPARTMENT_OTHER): Payer: Medicare Other

## 2013-10-22 VITALS — BP 118/67 | HR 79 | Temp 97.8°F | Resp 18

## 2013-10-22 DIAGNOSIS — C7B8 Other secondary neuroendocrine tumors: Secondary | ICD-10-CM

## 2013-10-22 DIAGNOSIS — C787 Secondary malignant neoplasm of liver and intrahepatic bile duct: Secondary | ICD-10-CM

## 2013-10-22 DIAGNOSIS — Z5111 Encounter for antineoplastic chemotherapy: Secondary | ICD-10-CM

## 2013-10-22 DIAGNOSIS — C7A1 Malignant poorly differentiated neuroendocrine tumors: Secondary | ICD-10-CM

## 2013-10-22 DIAGNOSIS — C3490 Malignant neoplasm of unspecified part of unspecified bronchus or lung: Secondary | ICD-10-CM

## 2013-10-22 MED ORDER — ONDANSETRON 8 MG/50ML IVPB (CHCC)
8.0000 mg | Freq: Once | INTRAVENOUS | Status: AC
  Administered 2013-10-22: 8 mg via INTRAVENOUS

## 2013-10-22 MED ORDER — SODIUM CHLORIDE 0.9 % IV SOLN
Freq: Once | INTRAVENOUS | Status: AC
  Administered 2013-10-22: 15:00:00 via INTRAVENOUS

## 2013-10-22 MED ORDER — ONDANSETRON 8 MG/NS 50 ML IVPB
INTRAVENOUS | Status: AC
  Filled 2013-10-22: qty 8

## 2013-10-22 MED ORDER — SODIUM CHLORIDE 0.9 % IV SOLN
100.0000 mg/m2 | Freq: Once | INTRAVENOUS | Status: AC
  Administered 2013-10-22: 170 mg via INTRAVENOUS
  Filled 2013-10-22: qty 8.5

## 2013-10-22 NOTE — Telephone Encounter (Signed)
Talked to pt and gave her appt for April and MAy and advised her to get appt calendar

## 2013-10-22 NOTE — Patient Instructions (Signed)
[facility] Discharge Instructions for Patients Receiving Chemotherapy  Today you received the following chemotherapy agents Etoposide.  To help prevent nausea and vomiting after your treatment, we encourage you to take your nausea medication Compazine 10 mg every 6 hours as needed.   If you develop nausea and vomiting that is not controlled by your nausea medication, call the clinic.   BELOW ARE SYMPTOMS THAT SHOULD BE REPORTED IMMEDIATELY:  *FEVER GREATER THAN 100.5 F  *CHILLS WITH OR WITHOUT FEVER  NAUSEA AND VOMITING THAT IS NOT CONTROLLED WITH YOUR NAUSEA MEDICATION  *UNUSUAL SHORTNESS OF BREATH  *UNUSUAL BRUISING OR BLEEDING  TENDERNESS IN MOUTH AND THROAT WITH OR WITHOUT PRESENCE OF ULCERS  *URINARY PROBLEMS  *BOWEL PROBLEMS  UNUSUAL RASH Items with * indicate a potential emergency and should be followed up as soon as possible.  Feel free to call the clinic you have any questions or concerns. The clinic phone number is [phone].

## 2013-10-23 ENCOUNTER — Ambulatory Visit (HOSPITAL_BASED_OUTPATIENT_CLINIC_OR_DEPARTMENT_OTHER): Payer: Medicare Other

## 2013-10-23 ENCOUNTER — Other Ambulatory Visit: Payer: Self-pay | Admitting: *Deleted

## 2013-10-23 VITALS — BP 102/44 | HR 82 | Temp 98.1°F

## 2013-10-23 DIAGNOSIS — C7A1 Malignant poorly differentiated neuroendocrine tumors: Secondary | ICD-10-CM

## 2013-10-23 DIAGNOSIS — C3490 Malignant neoplasm of unspecified part of unspecified bronchus or lung: Secondary | ICD-10-CM

## 2013-10-23 DIAGNOSIS — C787 Secondary malignant neoplasm of liver and intrahepatic bile duct: Secondary | ICD-10-CM

## 2013-10-23 DIAGNOSIS — Z5189 Encounter for other specified aftercare: Secondary | ICD-10-CM

## 2013-10-23 MED ORDER — PROCHLORPERAZINE MALEATE 10 MG PO TABS: 10.0000 mg | ORAL_TABLET | Freq: Four times a day (QID) | ORAL | Status: DC | PRN

## 2013-10-23 MED ORDER — PEGFILGRASTIM INJECTION 6 MG/0.6ML
6.0000 mg | Freq: Once | SUBCUTANEOUS | Status: AC
  Administered 2013-10-23: 6 mg via SUBCUTANEOUS
  Filled 2013-10-23: qty 0.6

## 2013-10-27 ENCOUNTER — Other Ambulatory Visit (HOSPITAL_BASED_OUTPATIENT_CLINIC_OR_DEPARTMENT_OTHER): Payer: Medicare Other

## 2013-10-27 DIAGNOSIS — C3490 Malignant neoplasm of unspecified part of unspecified bronchus or lung: Secondary | ICD-10-CM

## 2013-10-27 DIAGNOSIS — C7A1 Malignant poorly differentiated neuroendocrine tumors: Secondary | ICD-10-CM

## 2013-10-27 DIAGNOSIS — C7B8 Other secondary neuroendocrine tumors: Secondary | ICD-10-CM

## 2013-10-27 DIAGNOSIS — C787 Secondary malignant neoplasm of liver and intrahepatic bile duct: Secondary | ICD-10-CM

## 2013-10-27 LAB — CBC WITH DIFFERENTIAL/PLATELET
BASO%: 0.3 % (ref 0.0–2.0)
Basophils Absolute: 0 10*3/uL (ref 0.0–0.1)
EOS%: 0.8 % (ref 0.0–7.0)
Eosinophils Absolute: 0.1 10*3/uL (ref 0.0–0.5)
HEMATOCRIT: 32.1 % — AB (ref 34.8–46.6)
HEMOGLOBIN: 10.6 g/dL — AB (ref 11.6–15.9)
LYMPH%: 4 % — ABNORMAL LOW (ref 14.0–49.7)
MCH: 30.4 pg (ref 25.1–34.0)
MCHC: 33.2 g/dL (ref 31.5–36.0)
MCV: 91.6 fL (ref 79.5–101.0)
MONO#: 0.5 10*3/uL (ref 0.1–0.9)
MONO%: 2.7 % (ref 0.0–14.0)
NEUT#: 16.2 10*3/uL — ABNORMAL HIGH (ref 1.5–6.5)
NEUT%: 92.2 % — AB (ref 38.4–76.8)
Platelets: 470 10*3/uL — ABNORMAL HIGH (ref 145–400)
RBC: 3.5 10*6/uL — ABNORMAL LOW (ref 3.70–5.45)
RDW: 20.9 % — ABNORMAL HIGH (ref 11.2–14.5)
WBC: 17.6 10*3/uL — AB (ref 3.9–10.3)
lymph#: 0.7 10*3/uL — ABNORMAL LOW (ref 0.9–3.3)

## 2013-10-27 LAB — COMPREHENSIVE METABOLIC PANEL (CC13)
ALT: 7 U/L (ref 0–55)
ANION GAP: 10 meq/L (ref 3–11)
AST: 11 U/L (ref 5–34)
Albumin: 3.4 g/dL — ABNORMAL LOW (ref 3.5–5.0)
Alkaline Phosphatase: 158 U/L — ABNORMAL HIGH (ref 40–150)
BUN: 9.8 mg/dL (ref 7.0–26.0)
CHLORIDE: 105 meq/L (ref 98–109)
CO2: 26 mEq/L (ref 22–29)
CREATININE: 0.6 mg/dL (ref 0.6–1.1)
Calcium: 9.4 mg/dL (ref 8.4–10.4)
Glucose: 170 mg/dl — ABNORMAL HIGH (ref 70–140)
Potassium: 3.4 mEq/L — ABNORMAL LOW (ref 3.5–5.1)
Sodium: 141 mEq/L (ref 136–145)
TOTAL PROTEIN: 6.3 g/dL — AB (ref 6.4–8.3)
Total Bilirubin: 0.35 mg/dL (ref 0.20–1.20)

## 2013-11-03 ENCOUNTER — Other Ambulatory Visit (HOSPITAL_BASED_OUTPATIENT_CLINIC_OR_DEPARTMENT_OTHER): Payer: Medicare Other

## 2013-11-03 ENCOUNTER — Other Ambulatory Visit: Payer: Self-pay | Admitting: Medical Oncology

## 2013-11-03 ENCOUNTER — Encounter: Payer: Self-pay | Admitting: Internal Medicine

## 2013-11-03 ENCOUNTER — Ambulatory Visit (INDEPENDENT_AMBULATORY_CARE_PROVIDER_SITE_OTHER)
Admission: RE | Admit: 2013-11-03 | Discharge: 2013-11-03 | Disposition: A | Discharge status: Home / Self Care | Payer: Medicare Other | Source: Ambulatory Visit | Attending: Internal Medicine | Admitting: Internal Medicine

## 2013-11-03 ENCOUNTER — Ambulatory Visit (INDEPENDENT_AMBULATORY_CARE_PROVIDER_SITE_OTHER): Payer: Medicare Other | Admitting: Internal Medicine

## 2013-11-03 VITALS — BP 112/64 | HR 85 | Temp 98.5°F | Ht 63.0 in | Wt 160.0 lb

## 2013-11-03 DIAGNOSIS — C3490 Malignant neoplasm of unspecified part of unspecified bronchus or lung: Secondary | ICD-10-CM

## 2013-11-03 DIAGNOSIS — C7A1 Malignant poorly differentiated neuroendocrine tumors: Secondary | ICD-10-CM

## 2013-11-03 DIAGNOSIS — J449 Chronic obstructive pulmonary disease, unspecified: Secondary | ICD-10-CM

## 2013-11-03 DIAGNOSIS — Z23 Encounter for immunization: Secondary | ICD-10-CM

## 2013-11-03 DIAGNOSIS — J9610 Chronic respiratory failure, unspecified whether with hypoxia or hypercapnia: Secondary | ICD-10-CM

## 2013-11-03 DIAGNOSIS — J189 Pneumonia, unspecified organism: Secondary | ICD-10-CM

## 2013-11-03 LAB — CBC WITH DIFFERENTIAL/PLATELET
BASO%: 0.2 % (ref 0.0–2.0)
Basophils Absolute: 0 10*3/uL (ref 0.0–0.1)
EOS%: 0.9 % (ref 0.0–7.0)
Eosinophils Absolute: 0.1 10*3/uL (ref 0.0–0.5)
HCT: 30.7 % — ABNORMAL LOW (ref 34.8–46.6)
HGB: 10.1 g/dL — ABNORMAL LOW (ref 11.6–15.9)
LYMPH#: 0.9 10*3/uL (ref 0.9–3.3)
LYMPH%: 8.2 % — ABNORMAL LOW (ref 14.0–49.7)
MCH: 30.1 pg (ref 25.1–34.0)
MCHC: 32.9 g/dL (ref 31.5–36.0)
MCV: 91.6 fL (ref 79.5–101.0)
MONO#: 0.7 10*3/uL (ref 0.1–0.9)
MONO%: 6.6 % (ref 0.0–14.0)
NEUT#: 9.1 10*3/uL — ABNORMAL HIGH (ref 1.5–6.5)
NEUT%: 84.1 % — ABNORMAL HIGH (ref 38.4–76.8)
Platelets: 157 10*3/uL (ref 145–400)
RBC: 3.35 10*6/uL — AB (ref 3.70–5.45)
RDW: 20.6 % — AB (ref 11.2–14.5)
WBC: 10.8 10*3/uL — ABNORMAL HIGH (ref 3.9–10.3)

## 2013-11-03 LAB — COMPREHENSIVE METABOLIC PANEL (CC13)
ALBUMIN: 2.8 g/dL — AB (ref 3.5–5.0)
ALK PHOS: 117 U/L (ref 40–150)
AST: 10 U/L (ref 5–34)
Anion Gap: 11 mEq/L (ref 3–11)
BUN: 12.1 mg/dL (ref 7.0–26.0)
CALCIUM: 9.3 mg/dL (ref 8.4–10.4)
CHLORIDE: 103 meq/L (ref 98–109)
CO2: 26 mEq/L (ref 22–29)
Creatinine: 0.6 mg/dL (ref 0.6–1.1)
Glucose: 122 mg/dl (ref 70–140)
Potassium: 3.9 mEq/L (ref 3.5–5.1)
SODIUM: 140 meq/L (ref 136–145)
Total Bilirubin: 0.34 mg/dL (ref 0.20–1.20)
Total Protein: 6.9 g/dL (ref 6.4–8.3)

## 2013-11-03 MED ORDER — LEVOFLOXACIN 500 MG PO TABS: 500.0000 mg | ORAL_TABLET | Freq: Every day | ORAL | Status: DC

## 2013-11-03 MED ORDER — TRAMADOL HCL 50 MG PO TABS: ORAL_TABLET | ORAL | Status: DC

## 2013-11-03 NOTE — Assessment & Plan Note (Addendum)
Technically this is hcap due to freq abx/ exposures > levaquin 500 mg x 10 days then regroup with cxr   prevnar given

## 2013-11-03 NOTE — Assessment & Plan Note (Signed)
Rx 3lpm 24/7

## 2013-11-03 NOTE — Assessment & Plan Note (Signed)
Mild flare on pred 10 mg daily > increase to 20 mg daily until better then back to floor dose of 10 mg daily     Each maintenance medication was reviewed in detail including most importantly the difference between maintenance and as needed and under what circumstances the prns are to be used.  Please see instructions for details which were reviewed in writing and the patient given a copy.

## 2013-11-03 NOTE — Patient Instructions (Addendum)
Levaquin 500 mg daily x 10 days   Prednisone take 2 daily with breakfast until better then one daily   Leave lisinopril off indefinitely   For breathing>>> Only use your albuterol as a rescue medication to be used if you can't catch your breath by resting or doing a relaxed purse lip breathing pattern.  - The less you use it, the better it will work when you need it. - Ok to use up to maximum  every 2 hours if you must but call for immediate appointment if use goes up over your usual need - if not responding to the nebulizer you will need to be admitted.   For cough >> mucinex or mucinex dm supplemented with tramadol 50 mg up to 2 every 4 hours  The key is to stop smoking completely before smoking completely stops you!   Please remember to go to the  x-ray department downstairs for your tests - we will call you with the results when they are available  Keep appt with Dr Lamonte Sakai Late add: ? pna R base > f/u 10 d with cxr

## 2013-11-03 NOTE — Progress Notes (Signed)
Subjective:    Patient ID: Jessica Rollins, female    DOB: 1956-03-25    MRN: [id number]  HPI 58 yo active smoker, hx of COPD, DM, OSA on CPAP, ILD (by VATS bx, on Imuran / Pred per Allie Dimmer). Admitted end of January '15 to [facility] for AE-COPD and LLL CAP. During that hospitalization she had CT scan that showed B nodules concerning for malignancy. There was some discussion about possible EBUS + ENB for diagnosis, but a R supraclavicular node   >> Bx 08/18/13 >> [facility]   ROV 10/07/13 -- follows for tobacco, OSA, ILD, new dx [facility]. She has seen Dr Julien Nordmann and is on carboplatin, etoposide. She is due for a CT scan in 2 weeks. She is fatigued but is otherwise doing OK. She has cut her cigarettes down to 14 a day (we had agreed on 17 a day). She has been off Advair for a month - has been coughing more, feels that she misses it. Uses ProAir a couple times a day. She coughs up- mucous at night and in the am when she wakes.  rec add advair  11/03/2013 acute ov/Archana Eckman re: on advair/ spiriva/ floor of 10 mg pred/ and 02  3.5  Chief Complaint  Patient presents with  . Acute Visit    Pt c/o chills, low grade temp and cough x 4 days. Cough is prod with minimal yellow sputum.  She has also been more SOB since other symptoms started and has been using albuterol neb about 3 x per day.   off acei for "sev months" coughing at baseline x one month then acutely worse x 4 days prior to OV   - sob better p neb and comfortable at rest   No obvious day to day or daytime variabilty or assoc cp or chest tightness, subjective wheeze overt sinus or hb symptoms. No unusual exp hx or h/o childhood pna/ asthma or knowledge of premature birth.  Sleeping ok without nocturnal  or early am exacerbation  of respiratory  c/o's or need for noct saba. Also denies any obvious fluctuation of symptoms with weather or environmental changes or other aggravating or alleviating factors except as outlined above   Current Medications, Allergies, Complete Past  Medical History, Past Surgical History, Family History, and Social History were reviewed in [employer] record.  ROS  The following are not active complaints unless bolded sore throat, dysphagia, dental problems, itching, sneezing,  nasal congestion or excess/ purulent secretions, ear ache,   fever, chills, sweats, unintended wt loss, pleuritic or exertional cp, hemoptysis,  orthopnea pnd or leg swelling, presyncope, palpitations, heartburn, abdominal pain, anorexia, nausea, vomiting, diarrhea  or change in bowel or urinary habits, change in stools or urine, dysuria,hematuria,  rash, arthralgias, visual complaints, headache, numbness weakness or ataxia or problems with walking or coordination,  change in mood/affect or memory.               Objective:   Physical Exam  Wt Readings from Last 3 Encounters:  11/03/13 160 lb (72.576 kg)  10/20/13 160 lb 14.4 oz (72.984 kg)  10/07/13 162 lb (73.483 kg)       Gen: hoarse amb wf with harsh cough  ENT: No lesions,  mouth clear,  oropharynx clear, no postnasal drip  Neck: No JVD, no TMG, no carotid bruits  Lungs: No use of accessory muscles, no dullness to percussion, insp crackles R > L base s cough on insp  Cardiovascular: RRR, heart sounds normal,  no murmur or gallops, no peripheral edema  Musculoskeletal: No deformities, no cyanosis or clubbing  Neuro: alert, non focal  Skin: Warm, no lesions or rashes    CXR  11/03/2013 :  1. Increased basilar lung opacity when compared to prior studies consistent with pneumonia superimposed on chronic scarring/atelectasis. 2. COPD.       Assessment & Plan:

## 2013-11-04 ENCOUNTER — Telehealth: Payer: Self-pay | Admitting: Internal Medicine

## 2013-11-04 DIAGNOSIS — J189 Pneumonia, unspecified organism: Secondary | ICD-10-CM

## 2013-11-04 NOTE — Progress Notes (Signed)
Quick Note:  Spoke with pt and notified of results per Dr. Wert. Pt verbalized understanding and denied any questions.  ______ 

## 2013-11-04 NOTE — Telephone Encounter (Signed)
Spoke with the pt and notified of results and recs per MW She verbalized understanding  OV with cxr with TP for 11/12/13 at 10:30 am  Aware to come in a few mins early for cxr  Already ordered

## 2013-11-04 NOTE — Telephone Encounter (Signed)
Message copied by Rosana Berger on Tue Nov 04, 2013 10:43 AM ------      Message from: Christinia Gully B      Created: Mon Nov 03, 2013  4:30 PM       I reviewed cxr and does suggest some acute change c/w pna so finish levquin then ov with cxr at end of rx to regoup Tammy/ Byrum or me in that order ------

## 2013-11-10 ENCOUNTER — Ambulatory Visit: Payer: Medicare Other

## 2013-11-10 ENCOUNTER — Encounter: Payer: Self-pay | Admitting: Internal Medicine

## 2013-11-10 ENCOUNTER — Telehealth: Payer: Self-pay | Admitting: *Deleted

## 2013-11-10 ENCOUNTER — Ambulatory Visit (HOSPITAL_BASED_OUTPATIENT_CLINIC_OR_DEPARTMENT_OTHER): Payer: Medicare Other | Admitting: Internal Medicine

## 2013-11-10 ENCOUNTER — Telehealth: Payer: Self-pay | Admitting: Internal Medicine

## 2013-11-10 ENCOUNTER — Other Ambulatory Visit (HOSPITAL_BASED_OUTPATIENT_CLINIC_OR_DEPARTMENT_OTHER): Payer: Medicare Other

## 2013-11-10 VITALS — BP 120/58 | HR 94 | Temp 97.9°F | Resp 17 | Ht 63.0 in | Wt 159.6 lb

## 2013-11-10 DIAGNOSIS — C7A1 Malignant poorly differentiated neuroendocrine tumors: Secondary | ICD-10-CM

## 2013-11-10 DIAGNOSIS — C3490 Malignant neoplasm of unspecified part of unspecified bronchus or lung: Secondary | ICD-10-CM

## 2013-11-10 LAB — CBC WITH DIFFERENTIAL/PLATELET
BASO%: 0.2 % (ref 0.0–2.0)
BASOS ABS: 0 10*3/uL (ref 0.0–0.1)
EOS ABS: 0.1 10*3/uL (ref 0.0–0.5)
EOS%: 0.5 % (ref 0.0–7.0)
HCT: 32.7 % — ABNORMAL LOW (ref 34.8–46.6)
HEMOGLOBIN: 10.6 g/dL — AB (ref 11.6–15.9)
LYMPH%: 5.6 % — ABNORMAL LOW (ref 14.0–49.7)
MCH: 29.9 pg (ref 25.1–34.0)
MCHC: 32.2 g/dL (ref 31.5–36.0)
MCV: 92.7 fL (ref 79.5–101.0)
MONO#: 0.5 10*3/uL (ref 0.1–0.9)
MONO%: 3.9 % (ref 0.0–14.0)
NEUT#: 12.1 10*3/uL — ABNORMAL HIGH (ref 1.5–6.5)
NEUT%: 89.8 % — ABNORMAL HIGH (ref 38.4–76.8)
Platelets: 494 10*3/uL — ABNORMAL HIGH (ref 145–400)
RBC: 3.53 10*6/uL — AB (ref 3.70–5.45)
RDW: 20.9 % — AB (ref 11.2–14.5)
WBC: 13.5 10*3/uL — ABNORMAL HIGH (ref 3.9–10.3)
lymph#: 0.8 10*3/uL — ABNORMAL LOW (ref 0.9–3.3)

## 2013-11-10 LAB — COMPREHENSIVE METABOLIC PANEL (CC13)
ALT: 8 U/L (ref 0–55)
AST: 12 U/L (ref 5–34)
Albumin: 3.2 g/dL — ABNORMAL LOW (ref 3.5–5.0)
Alkaline Phosphatase: 133 U/L (ref 40–150)
Anion Gap: 12 mEq/L — ABNORMAL HIGH (ref 3–11)
BILIRUBIN TOTAL: 0.36 mg/dL (ref 0.20–1.20)
BUN: 15 mg/dL (ref 7.0–26.0)
CO2: 26 mEq/L (ref 22–29)
Calcium: 10.1 mg/dL (ref 8.4–10.4)
Chloride: 102 mEq/L (ref 98–109)
Creatinine: 0.7 mg/dL (ref 0.6–1.1)
GLUCOSE: 201 mg/dL — AB (ref 70–140)
POTASSIUM: 4.1 meq/L (ref 3.5–5.1)
Sodium: 139 mEq/L (ref 136–145)
Total Protein: 6.9 g/dL (ref 6.4–8.3)

## 2013-11-10 NOTE — Telephone Encounter (Signed)
gve the pt her may,june 2015 appt. Pt is aware that she will be called regarding the ct scan appt. Sent michelle a staff message to move the chemo appts back one week.

## 2013-11-10 NOTE — Patient Instructions (Signed)
Smoking Cessation, Tips for Success If you are ready to quit smoking, congratulations! You have chosen to help yourself be healthier. Cigarettes bring nicotine, tar, carbon monoxide, and other irritants into your body. Your lungs, heart, and blood vessels will be able to work better without these poisons. There are many different ways to quit smoking. Nicotine gum, nicotine patches, a nicotine inhaler, or nicotine nasal spray can help with physical craving. Hypnosis, support groups, and medicines help break the habit of smoking. WHAT THINGS CAN I DO TO MAKE QUITTING EASIER?  Here are some tips to help you quit for good:  Pick a date when you will quit smoking completely. Tell all of your friends and family about your plan to quit on that date.  Do not try to slowly cut down on the number of cigarettes you are smoking. Pick a quit date and quit smoking completely starting on that day.  Throw away all cigarettes.   Clean and remove all ashtrays from your home, work, and car.   On a card, write down your reasons for quitting. Carry the card with you and read it when you get the urge to smoke.   Cleanse your body of nicotine. Drink enough water and fluids to keep your urine clear or pale yellow. Do this after quitting to flush the nicotine from your body.   Learn to predict your moods. Do not let a bad situation be your excuse to have a cigarette. Some situations in your life might tempt you into wanting a cigarette.   Never have "just one" cigarette. It leads to wanting another and another. Remind yourself of your decision to quit.   Change habits associated with smoking. If you smoked while driving or when feeling stressed, try other activities to replace smoking. Stand up when drinking your coffee. Brush your teeth after eating. Sit in a different chair when you read the paper. Avoid alcohol while trying to quit, and try to drink fewer caffeinated beverages. Alcohol and caffeine may urge  you to smoke.   Avoid foods and drinks that can trigger a desire to smoke, such as sugary or spicy foods and alcohol.   Ask people who smoke not to smoke around you.   Have something planned to do right after eating or having a cup of coffee. For example, plan to take a walk or exercise.   Try a relaxation exercise to calm you down and decrease your stress. Remember, you may be tense and nervous for the first 2 weeks after you quit, but this will pass.   Find new activities to keep your hands busy. Play with a pen, coin, or rubber band. Doodle or draw things on paper.   Brush your teeth right after eating. This will help cut down on the craving for the taste of tobacco after meals. You can also try mouthwash.   Use oral substitutes in place of cigarettes. Try using lemon drops, carrots, cinnamon sticks, or chewing gum. Keep them handy so they are available when you have the urge to smoke.   When you have the urge to smoke, try deep breathing.   Designate your home as a nonsmoking area.   If you are a heavy smoker, ask your health care provider about a prescription for nicotine chewing gum. It can ease your withdrawal from nicotine.   Reward yourself. Set aside the cigarette money you save and buy yourself something nice.   Look for support from others. Join a support group or   smoking cessation program. Ask someone at home or at work to help you with your plan to quit smoking.   Always ask yourself, "Do I need this cigarette or is this just a reflex?" Tell yourself, "Today, I choose not to smoke," or "I do not want to smoke." You are reminding yourself of your decision to quit.  Do not replace cigarette smoking with electronic cigarettes (commonly called e-cigarettes). The safety of e-cigarettes is unknown, and some may contain harmful chemicals.  If you relapse, do not give up! Plan ahead and think about what you will do the next time you get the urge to smoke.  HOW WILL  I FEEL WHEN I QUIT SMOKING? You may have symptoms of withdrawal because your body is used to nicotine (the addictive substance in cigarettes). You may crave cigarettes, be irritable, feel very hungry, cough often, get headaches, or have difficulty concentrating. The withdrawal symptoms are only temporary. They are strongest when you first quit but will go away within 10 14 days. When withdrawal symptoms occur, stay in control. Think about your reasons for quitting. Remind yourself that these are signs that your body is healing and getting used to being without cigarettes. Remember that withdrawal symptoms are easier to treat than the major diseases that smoking can cause.  Even after the withdrawal is over, expect periodic urges to smoke. However, these cravings are generally short lived and will go away whether you smoke or not. Do not smoke!  WHAT RESOURCES ARE AVAILABLE TO HELP ME QUIT SMOKING? Your health care provider can direct you to community resources or hospitals for support, which may include:  Group support.  Education.  Hypnosis.  Therapy. Document Released: 03/24/2004 Document Revised: 04/16/2013 Document Reviewed: 12/12/2012 [employer] Patient Information 2014 [employer].  

## 2013-11-10 NOTE — Progress Notes (Signed)
[facility] [phone]   [phone]  OFFICE PROGRESS NOTE  Ramond Dial, MD [address] [name] [address]  DIAGNOSIS: Extensive stage small cell lung cancer diagnosed in February of 2015.  PRIOR THERAPY: None  CURRENT THERAPY: Systemic chemotherapy with carboplatin for AUC of 5 on day 1 and etoposide 100 mg/M2 on days 1, 2 and 3 with Neulasta support on day 4. She is status post 3 cycles.   INTERVAL HISTORY: Jessica Rollins 58 y.o. female returns to the clinic today for followup visit accompanied by her son. The patient tolerated the third cycle of her systemic chemotherapy fairly well with no significant adverse effects. She was recently diagnosed with pneumonia and started on treatment with Levaquin which is expected to be completed in 2 days. She denied having any significant fever or chills. She denied having any chest pain but continues to have the baseline shortness of breath and she is currently on home oxygen, no cough or hemoptysis. She has no significant weight loss or night sweats. She is here today to start cycle #4 of her chemotherapy.  MEDICAL HISTORY: Past Medical History  Diagnosis Date  . COPD (chronic obstructive pulmonary disease)   . Pulmonary fibrosis     diagnosed with this 2001.    . Diabetes mellitus without complication   . Hiatal hernia   . Depression   . Anxiety   . High cholesterol   . Hypertension   . Osteoporosis   . OSA on CPAP     13cm  . Overactive bladder   . Heart murmur     ALLERGIES:  has No Known Allergies.  MEDICATIONS:  Current Outpatient Prescriptions  Medication Sig Dispense Refill  . albuterol (PROVENTIL) (2.5 MG/3ML) 0.083% nebulizer solution Take 3 mLs (2.5 mg total) by nebulization every 2 (two) hours as needed for wheezing or shortness of breath.  75 mL  12  . aspirin 325 MG tablet Take 325 mg by mouth daily.      Marland Kitchen azaTHIOprine (IMURAN) 50 MG tablet Take 50 mg by mouth  2 (two) times daily.      . calcium citrate-vitamin D (CITRACAL+D) 315-200 MG-UNIT per tablet Take 1 tablet by mouth daily.      . Cimetidine (TAGAMET PO) Take 1 tablet by mouth daily.      . cyanocobalamin 1000 MCG tablet Take 100 mcg by mouth daily.      Marland Kitchen esomeprazole (NEXIUM) 40 MG capsule Take 40 mg by mouth daily before breakfast.      . Fluticasone-Salmeterol (ADVAIR DISKUS) 250-50 MCG/DOSE AEPB Inhale 1 puff into the lungs 2 (two) times daily.  60 each  5  . folic acid (FOLVITE) 1 MG tablet Take 1 mg by mouth 2 (two) times daily.      Marland Kitchen guaiFENesin (MUCINEX) 600 MG 12 hr tablet Take 600 mg by mouth 2 (two) times daily.      . Insulin Detemir (LEVEMIR FLEXPEN Gravity) Inject 20 Units into the skin daily.      . IRON PO Take 1 tablet by mouth 4 (four) times daily.      Marland Kitchen levofloxacin (LEVAQUIN) 500 MG tablet Take 1 tablet (500 mg total) by mouth daily.  10 tablet  0  . LORazepam (ATIVAN) 1 MG tablet Take 1 mg by mouth at bedtime.      . metFORMIN (GLUCOPHAGE) 500 MG tablet Take 1,000 mg by mouth 2 (two) times daily with a  meal.       . Multiple Vitamin (MULTIVITAMIN WITH MINERALS) TABS tablet Take 1 tablet by mouth daily.      . niacin 500 MG tablet Take 500 mg by mouth daily.      . Omega-3 Fatty Acids (FISH OIL) 300 MG CAPS Take 1 capsule by mouth daily.      Marland Kitchen oxybutynin (DITROPAN-XL) 10 MG 24 hr tablet Take 10 mg by mouth daily.      Marland Kitchen PARoxetine (PAXIL) 30 MG tablet Take 30 mg by mouth daily.      . predniSONE (DELTASONE) 10 MG tablet Take 10 mg by mouth daily with breakfast.       . prochlorperazine (COMPAZINE) 10 MG tablet Take 1 tablet (10 mg total) by mouth every 6 (six) hours as needed for nausea or vomiting.  60 tablet  0  . rosuvastatin (CRESTOR) 10 MG tablet Take 10 mg by mouth daily.      Marland Kitchen SPIRIVA HANDIHALER 18 MCG inhalation capsule Place 18 mcg into inhaler and inhale daily.       . traMADol (ULTRAM) 50 MG tablet 1-2 every 4 hours as needed for cough or pain  40 tablet  0  .  vitamin E 400 UNIT capsule Take 400 Units by mouth daily.       No current facility-administered medications for this visit.    SURGICAL HISTORY:  Past Surgical History  Procedure Laterality Date  . Bladder suspension    . Abdominal hysterectomy      severe bleeding-when she was in her 30's  . Lung biopsy      REVIEW OF SYSTEMS:  Constitutional: positive for fatigue Eyes: negative Ears, nose, mouth, throat, and face: negative Respiratory: positive for dyspnea on exertion Cardiovascular: negative Gastrointestinal: negative Genitourinary:negative Integument/breast: negative Hematologic/lymphatic: negative Musculoskeletal:negative Neurological: negative Behavioral/Psych: negative Endocrine: negative Allergic/Immunologic: negative   PHYSICAL EXAMINATION: General appearance: alert, cooperative, fatigued and no distress Head: Normocephalic, without obvious abnormality, atraumatic Neck: no adenopathy, no JVD, supple, symmetrical, trachea midline and thyroid not enlarged, symmetric, no tenderness/mass/nodules Lymph nodes: Cervical, supraclavicular, and axillary nodes normal. Resp: clear to auscultation bilaterally Back: symmetric, no curvature. ROM normal. No CVA tenderness. Cardio: regular rate and rhythm, S1, S2 normal, no murmur, click, rub or gallop GI: soft, non-tender; bowel sounds normal; no masses,  no organomegaly Extremities: extremities normal, atraumatic, no cyanosis or edema Neurologic: Alert and oriented X 3, normal strength and tone. Normal symmetric reflexes. Normal coordination and gait  ECOG PERFORMANCE STATUS: 1 - Symptomatic but completely ambulatory  Blood pressure 120/58, pulse 94, temperature 97.9 F (36.6 C), temperature source Oral, resp. rate 17, height 5\' 3"  (1.6 m), weight 159 lb 9.6 oz (72.394 kg), SpO2 95.00%, peak flow 3 L/min.  LABORATORY DATA: Lab Results  Component Value Date   WBC 13.5* 11/10/2013   HGB 10.6* 11/10/2013   HCT 32.7* 11/10/2013    MCV 92.7 11/10/2013   PLT 494* 11/10/2013      Chemistry      Component Value Date/Time   NA 139 11/10/2013 1321   NA 137 08/18/2013 0742   K 4.1 11/10/2013 1321   K 4.5 08/18/2013 0742   CL 95* 08/18/2013 0742   CO2 26 11/10/2013 1321   CO2 31 08/18/2013 0742   BUN 15.0 11/10/2013 1321   BUN 23 08/18/2013 0742   CREATININE 0.7 11/10/2013 1321   CREATININE 0.40* 08/18/2013 0742      Component Value Date/Time   CALCIUM 10.1 11/10/2013 1321  CALCIUM 9.2 08/18/2013 0742   ALKPHOS 133 11/10/2013 1321   ALKPHOS 104 08/12/2013 0721   AST 12 11/10/2013 1321   AST 16 08/12/2013 0721   ALT 8 11/10/2013 1321   ALT 16 08/12/2013 0721   BILITOT 0.36 11/10/2013 1321   BILITOT 0.3 08/12/2013 0721       RADIOGRAPHIC STUDIES:  ASSESSMENT AND PLAN: This is a very pleasant 58 years old white female with extensive stage small cell lung cancer currently undergoing systemic chemotherapy with carboplatin and etoposide status post 3 cycles. She tolerated the first 3 cycles of her treatment fairly well with no significant adverse effects except for recent pneumonia. She is currently on treatment with Levaquin for pneumonia. I recommended for the patient to delay the start of cycle #4 by 1 week to give her more time to recover from her recent diagnosis was pneumonia. She would come back for followup visit in 4 weeks with the start of cycle #5 after repeating CT scan of the chest, abdomen and pelvis for restaging of her disease. She was advised to call immediately if she has any concerning symptoms in the interval. The patient voices understanding of current disease status and treatment options and is in agreement with the current care plan.  All questions were answered. The patient knows to call the clinic with any problems, questions or concerns. We can certainly see the patient much sooner if necessary.   Disclaimer: This note was dictated with voice recognition software. Similar sounding words can inadvertently be transcribed and may  not be corrected upon review.

## 2013-11-10 NOTE — Telephone Encounter (Signed)
Per staff message and POF I have scheduled appts.  JMW  

## 2013-11-11 ENCOUNTER — Ambulatory Visit: Payer: Medicare Other

## 2013-11-12 ENCOUNTER — Ambulatory Visit: Payer: Medicare Other

## 2013-11-12 ENCOUNTER — Ambulatory Visit (INDEPENDENT_AMBULATORY_CARE_PROVIDER_SITE_OTHER): Payer: Medicare Other | Admitting: Adult Health

## 2013-11-12 ENCOUNTER — Encounter: Payer: Self-pay | Admitting: Adult Health

## 2013-11-12 ENCOUNTER — Ambulatory Visit: Payer: Medicare Other | Admitting: Adult Health

## 2013-11-12 ENCOUNTER — Ambulatory Visit (INDEPENDENT_AMBULATORY_CARE_PROVIDER_SITE_OTHER)
Admission: RE | Admit: 2013-11-12 | Discharge: 2013-11-12 | Disposition: A | Discharge status: Home / Self Care | Payer: Medicare Other | Source: Ambulatory Visit | Attending: Adult Health | Admitting: Adult Health

## 2013-11-12 VITALS — BP 104/64 | HR 65 | Temp 98.1°F | Ht 63.0 in | Wt 159.0 lb

## 2013-11-12 DIAGNOSIS — J189 Pneumonia, unspecified organism: Secondary | ICD-10-CM

## 2013-11-12 NOTE — Progress Notes (Signed)
Subjective:    Patient ID: Jessica Rollins, female    DOB: 20-Sep-1955    MRN: [id number] HPI 58 yo active smoker, hx of COPD, DM, OSA on CPAP, ILD (by VATS bx, on Imuran / Pred per Allie Dimmer). Admitted end of January '15 to [facility] for AE-COPD and LLL CAP. During that hospitalization she had CT scan that showed B nodules concerning for malignancy. There was some discussion about possible EBUS + ENB for diagnosis, but a R supraclavicular node   >> Bx 08/18/13 >> SCLCA   ROV 10/07/13 -- follows for tobacco, OSA, ILD, new dx SCLCA. She has seen Dr Julien Nordmann and is on carboplatin, etoposide. She is due for a CT scan in 2 weeks. She is fatigued but is otherwise doing OK. She has cut her cigarettes down to 14 a day (we had agreed on 17 a day). She has been off Advair for a month - has been coughing more, feels that she misses it. Uses ProAir a couple times a day. She coughs up- mucous at night and in the am when she wakes.  rec add advair  11/03/2013 acute ov/Wert re: on advair/ spiriva/ floor of 10 mg pred/ and 02  3.5  Chief Complaint  Patient presents with  . Acute Visit    Pt c/o chills, low grade temp and cough x 4 days. Cough is prod with minimal yellow sputum.  She has also been more SOB since other symptoms started and has been using albuterol neb about 3 x per day.   off acei for "sev months" coughing at baseline x one month then acutely worse x 4 days prior to OV   - sob better p neb and comfortable at rest  >>Levaquin x 10 d   11/12/2013 Follow up PNA  Returns for 1 week follow up for PNA .  Seen last week with increased cough/congestion  CXR last ov showed increased basilar opacities c/w PNA  tx w/ levaquin x 10 d.  Reports is about 80% better but still having some dyspnea, prod cough with clear/yellow mucus.   Denies wheezing, tightnes, f/c/s, hemoptysis.  finished Levaquin this AM. No fever, hemoptyisis, chest pain , orthopnea, or edema.  Currently on prednisone 20mg  daily (baseline 10mg  daily )    Known metastatic small cell lung cancer undergoing systemic chemo  Chemo on hold this week , to resume next week.   Current Medications, Allergies, Complete Past Medical History, Past Surgical History, Family History, and Social History were reviewed in [employer] record.  ROS  The following are not active complaints unless bolded sore throat, dysphagia, dental problems, itching, sneezing,  nasal congestion or excess/ purulent secretions, ear ache,   sweats, unintended wt loss, pleuritic or exertional cp, hemoptysis,  orthopnea pnd or leg swelling, presyncope, palpitations, heartburn, abdominal pain, anorexia, nausea, vomiting, diarrhea  or change in bowel or urinary habits, change in stools or urine, dysuria,hematuria,  rash, arthralgias, visual complaints, headache, numbness weakness or ataxia or problems with walking or coordination,  change in mood/affect or memory.               Objective:   Physical Exam   Gen: hoarse amb wf with harsh cough  ENT: No lesions,  mouth clear,  oropharynx clear, no postnasal drip  Neck: No JVD, no TMG, no carotid bruits  Lungs: No use of accessory muscles, no dullness to percussion, bibasilar crackles   Cardiovascular: RRR, heart sounds normal, no murmur or gallops, no peripheral  edema  Musculoskeletal: No deformities, no cyanosis or clubbing  Neuro: alert, non focal  Skin: Warm, no lesions or rashes    CXR  11/03/2013 :  1. Increased basilar lung opacity when compared to prior studies consistent with pneumonia superimposed on chronic scarring/atelectasis. 2. COPD.  11/12/2013 CXR >Stable pulmonary fibrosis. No superimposed acute findings are  identified.       Assessment & Plan:

## 2013-11-12 NOTE — Assessment & Plan Note (Signed)
Resolving CAP superimposed on COPD/ILD and metastatic lung cancer  Slowly improving  cxr w/stable changes   Plan  Finish Levaquin today as planned  Continue on prednisone 20mg  daily this week then back to 10mg  daily next Monday.  Continue on Spiriva and Advair -rinse after use.  CONTINUE TO WORK ON CUTTING BACK TO OFF OF SMOKING  Follow up Dr. Lamonte Sakai  In 3-4 weeks and As needed

## 2013-11-12 NOTE — Patient Instructions (Signed)
Finish Levaquin today as planned  Continue on prednisone 20mg  daily this week then back to 10mg  daily next Monday.  Continue on Spiriva and Advair -rinse after use.  CONTINUE TO WORK ON CUTTING BACK TO OFF OF SMOKING  Follow up Dr. Lamonte Sakai  In 3-4 weeks and As needed

## 2013-11-13 ENCOUNTER — Ambulatory Visit: Payer: Medicare Other

## 2013-11-14 ENCOUNTER — Telehealth: Payer: Self-pay | Admitting: *Deleted

## 2013-11-14 NOTE — Telephone Encounter (Signed)
I have called the patient and moved her lab appt closer to her treatment appt on 5/11

## 2013-11-17 ENCOUNTER — Other Ambulatory Visit (HOSPITAL_BASED_OUTPATIENT_CLINIC_OR_DEPARTMENT_OTHER): Payer: Medicare Other

## 2013-11-17 ENCOUNTER — Ambulatory Visit (HOSPITAL_BASED_OUTPATIENT_CLINIC_OR_DEPARTMENT_OTHER): Payer: Medicare Other

## 2013-11-17 VITALS — BP 130/69 | HR 106 | Temp 97.9°F | Resp 18

## 2013-11-17 DIAGNOSIS — C3490 Malignant neoplasm of unspecified part of unspecified bronchus or lung: Secondary | ICD-10-CM

## 2013-11-17 DIAGNOSIS — Z5111 Encounter for antineoplastic chemotherapy: Secondary | ICD-10-CM

## 2013-11-17 DIAGNOSIS — C7A1 Malignant poorly differentiated neuroendocrine tumors: Secondary | ICD-10-CM

## 2013-11-17 DIAGNOSIS — C7B8 Other secondary neuroendocrine tumors: Secondary | ICD-10-CM

## 2013-11-17 DIAGNOSIS — C787 Secondary malignant neoplasm of liver and intrahepatic bile duct: Secondary | ICD-10-CM

## 2013-11-17 LAB — CBC WITH DIFFERENTIAL/PLATELET
BASO%: 0.6 % (ref 0.0–2.0)
BASOS ABS: 0.1 10*3/uL (ref 0.0–0.1)
EOS ABS: 0.1 10*3/uL (ref 0.0–0.5)
EOS%: 0.5 % (ref 0.0–7.0)
HEMATOCRIT: 32.8 % — AB (ref 34.8–46.6)
HGB: 10.7 g/dL — ABNORMAL LOW (ref 11.6–15.9)
LYMPH%: 3.3 % — AB (ref 14.0–49.7)
MCH: 30.7 pg (ref 25.1–34.0)
MCHC: 32.7 g/dL (ref 31.5–36.0)
MCV: 94.1 fL (ref 79.5–101.0)
MONO#: 0.6 10*3/uL (ref 0.1–0.9)
MONO%: 4.8 % (ref 0.0–14.0)
NEUT%: 90.8 % — AB (ref 38.4–76.8)
NEUTROS ABS: 12.1 10*3/uL — AB (ref 1.5–6.5)
PLATELETS: 529 10*3/uL — AB (ref 145–400)
RBC: 3.48 10*6/uL — ABNORMAL LOW (ref 3.70–5.45)
RDW: 20.3 % — ABNORMAL HIGH (ref 11.2–14.5)
WBC: 13.4 10*3/uL — ABNORMAL HIGH (ref 3.9–10.3)
lymph#: 0.4 10*3/uL — ABNORMAL LOW (ref 0.9–3.3)

## 2013-11-17 LAB — COMPREHENSIVE METABOLIC PANEL (CC13)
ALT: 10 U/L (ref 0–55)
ANION GAP: 12 meq/L — AB (ref 3–11)
AST: 15 U/L (ref 5–34)
Albumin: 3.2 g/dL — ABNORMAL LOW (ref 3.5–5.0)
Alkaline Phosphatase: 116 U/L (ref 40–150)
BILIRUBIN TOTAL: 0.27 mg/dL (ref 0.20–1.20)
BUN: 12.9 mg/dL (ref 7.0–26.0)
CO2: 26 meq/L (ref 22–29)
CREATININE: 0.7 mg/dL (ref 0.6–1.1)
Calcium: 9.7 mg/dL (ref 8.4–10.4)
Chloride: 104 mEq/L (ref 98–109)
GLUCOSE: 234 mg/dL — AB (ref 70–140)
Potassium: 4.5 mEq/L (ref 3.5–5.1)
Sodium: 141 mEq/L (ref 136–145)
TOTAL PROTEIN: 6.6 g/dL (ref 6.4–8.3)

## 2013-11-17 MED ORDER — SODIUM CHLORIDE 0.9 % IV SOLN
534.0000 mg | Freq: Once | INTRAVENOUS | Status: AC
  Administered 2013-11-17: 530 mg via INTRAVENOUS
  Filled 2013-11-17: qty 53

## 2013-11-17 MED ORDER — SODIUM CHLORIDE 0.9 % IV SOLN
100.0000 mg/m2 | Freq: Once | INTRAVENOUS | Status: AC
  Administered 2013-11-17: 170 mg via INTRAVENOUS
  Filled 2013-11-17: qty 8.5

## 2013-11-17 MED ORDER — DEXAMETHASONE SODIUM PHOSPHATE 20 MG/5ML IJ SOLN
20.0000 mg | Freq: Once | INTRAMUSCULAR | Status: AC
  Administered 2013-11-17: 20 mg via INTRAVENOUS

## 2013-11-17 MED ORDER — DEXAMETHASONE SODIUM PHOSPHATE 20 MG/5ML IJ SOLN
INTRAMUSCULAR | Status: AC
  Filled 2013-11-17: qty 5

## 2013-11-17 MED ORDER — ONDANSETRON 16 MG/50ML IVPB (CHCC)
16.0000 mg | Freq: Once | INTRAVENOUS | Status: AC
  Administered 2013-11-17: 16 mg via INTRAVENOUS

## 2013-11-17 MED ORDER — ONDANSETRON 16 MG/50ML IVPB (CHCC)
INTRAVENOUS | Status: AC
  Filled 2013-11-17: qty 16

## 2013-11-17 MED ORDER — SODIUM CHLORIDE 0.9 % IV SOLN
Freq: Once | INTRAVENOUS | Status: AC
  Administered 2013-11-17: 14:00:00 via INTRAVENOUS

## 2013-11-17 NOTE — Patient Instructions (Addendum)
[facility] Discharge Instructions for Patients Receiving Chemotherapy  Today you received the following chemotherapy agents: Carboplatin, VP-16.  To help prevent nausea and vomiting after your treatment, we encourage you to take your nausea medication.   If you develop nausea and vomiting that is not controlled by your nausea medication, call the clinic.   BELOW ARE SYMPTOMS THAT SHOULD BE REPORTED IMMEDIATELY:  *FEVER GREATER THAN 100.5 F  *CHILLS WITH OR WITHOUT FEVER  NAUSEA AND VOMITING THAT IS NOT CONTROLLED WITH YOUR NAUSEA MEDICATION  *UNUSUAL SHORTNESS OF BREATH  *UNUSUAL BRUISING OR BLEEDING  TENDERNESS IN MOUTH AND THROAT WITH OR WITHOUT PRESENCE OF ULCERS  *URINARY PROBLEMS  *BOWEL PROBLEMS  UNUSUAL RASH Items with * indicate a potential emergency and should be followed up as soon as possible.  Feel free to call the clinicshould you have any questions or concerns. The clinic phone number is [phone].

## 2013-11-18 ENCOUNTER — Other Ambulatory Visit: Payer: Self-pay | Admitting: *Deleted

## 2013-11-18 ENCOUNTER — Ambulatory Visit (HOSPITAL_BASED_OUTPATIENT_CLINIC_OR_DEPARTMENT_OTHER): Payer: Medicare Other

## 2013-11-18 VITALS — BP 113/49 | HR 85 | Temp 98.1°F | Resp 18

## 2013-11-18 DIAGNOSIS — C787 Secondary malignant neoplasm of liver and intrahepatic bile duct: Secondary | ICD-10-CM

## 2013-11-18 DIAGNOSIS — C7A1 Malignant poorly differentiated neuroendocrine tumors: Secondary | ICD-10-CM

## 2013-11-18 DIAGNOSIS — C3490 Malignant neoplasm of unspecified part of unspecified bronchus or lung: Secondary | ICD-10-CM

## 2013-11-18 DIAGNOSIS — Z5111 Encounter for antineoplastic chemotherapy: Secondary | ICD-10-CM

## 2013-11-18 DIAGNOSIS — C7B8 Other secondary neuroendocrine tumors: Secondary | ICD-10-CM

## 2013-11-18 MED ORDER — PROCHLORPERAZINE MALEATE 10 MG PO TABS
ORAL_TABLET | ORAL | Status: AC
  Filled 2013-11-18: qty 1

## 2013-11-18 MED ORDER — PROCHLORPERAZINE MALEATE 10 MG PO TABS
10.0000 mg | ORAL_TABLET | Freq: Once | ORAL | Status: AC
  Administered 2013-11-18: 10 mg via ORAL

## 2013-11-18 MED ORDER — SODIUM CHLORIDE 0.9 % IV SOLN
100.0000 mg/m2 | Freq: Once | INTRAVENOUS | Status: AC
  Administered 2013-11-18: 170 mg via INTRAVENOUS
  Filled 2013-11-18: qty 8.5

## 2013-11-18 MED ORDER — SODIUM CHLORIDE 0.9 % IV SOLN
Freq: Once | INTRAVENOUS | Status: AC
  Administered 2013-11-18: 15:00:00 via INTRAVENOUS

## 2013-11-18 MED ORDER — PROCHLORPERAZINE MALEATE 10 MG PO TABS: 10.0000 mg | ORAL_TABLET | Freq: Four times a day (QID) | ORAL | Status: AC | PRN

## 2013-11-18 NOTE — Patient Instructions (Signed)
[facility] Discharge Instructions for Patients Receiving Chemotherapy  Today you received the following chemotherapy agents: Etoposide  To help prevent nausea and vomiting after your treatment, we encourage you to take your nausea medication.  Take it as often as prescribed.     If you develop nausea and vomiting that is not controlled by your nausea medication, call the clinic. If it is after clinic hours your family physician or the after hours number for the clinic or go to the Emergency Department.   BELOW ARE SYMPTOMS THAT SHOULD BE REPORTED IMMEDIATELY:  *FEVER GREATER THAN 100.5 F  *CHILLS WITH OR WITHOUT FEVER  NAUSEA AND VOMITING THAT IS NOT CONTROLLED WITH YOUR NAUSEA MEDICATION  *UNUSUAL SHORTNESS OF BREATH  *UNUSUAL BRUISING OR BLEEDING  TENDERNESS IN MOUTH AND THROAT WITH OR WITHOUT PRESENCE OF ULCERS  *URINARY PROBLEMS  *BOWEL PROBLEMS  UNUSUAL RASH Items with * indicate a potential emergency and should be followed up as soon as possible.  Feel free to call the clinic you have any questions or concerns. The clinic phone number is [phone].   I have been informed and understand all the instructions given to me. I know to contact the clinic, my physician, or go to the Emergency Department if any problems should occur. I do not have any questions at this time, but understand that I may call the clinic during office hours   should I have any questions or need assistance in obtaining follow up care.    __________________________________________  _____________  __________ Signature of Patient or Authorized Representative            Date                   Time    __________________________________________ Nurse's Signature

## 2013-11-19 ENCOUNTER — Ambulatory Visit (HOSPITAL_BASED_OUTPATIENT_CLINIC_OR_DEPARTMENT_OTHER): Payer: Medicare Other

## 2013-11-19 VITALS — BP 120/50 | HR 80 | Temp 98.1°F | Resp 20

## 2013-11-19 DIAGNOSIS — C787 Secondary malignant neoplasm of liver and intrahepatic bile duct: Secondary | ICD-10-CM

## 2013-11-19 DIAGNOSIS — C3490 Malignant neoplasm of unspecified part of unspecified bronchus or lung: Secondary | ICD-10-CM

## 2013-11-19 DIAGNOSIS — C7A1 Malignant poorly differentiated neuroendocrine tumors: Secondary | ICD-10-CM

## 2013-11-19 DIAGNOSIS — C7B8 Other secondary neuroendocrine tumors: Secondary | ICD-10-CM

## 2013-11-19 DIAGNOSIS — Z5111 Encounter for antineoplastic chemotherapy: Secondary | ICD-10-CM

## 2013-11-19 MED ORDER — PROCHLORPERAZINE MALEATE 10 MG PO TABS
ORAL_TABLET | ORAL | Status: AC
  Filled 2013-11-19: qty 1

## 2013-11-19 MED ORDER — SODIUM CHLORIDE 0.9 % IV SOLN
Freq: Once | INTRAVENOUS | Status: AC
  Administered 2013-11-19: 14:00:00 via INTRAVENOUS

## 2013-11-19 MED ORDER — SODIUM CHLORIDE 0.9 % IV SOLN
100.0000 mg/m2 | Freq: Once | INTRAVENOUS | Status: AC
  Administered 2013-11-19: 170 mg via INTRAVENOUS
  Filled 2013-11-19: qty 8.5

## 2013-11-19 MED ORDER — PROCHLORPERAZINE MALEATE 10 MG PO TABS
10.0000 mg | ORAL_TABLET | Freq: Once | ORAL | Status: AC
  Administered 2013-11-19: 10 mg via ORAL

## 2013-11-19 NOTE — Progress Notes (Signed)
Discharged at 1535 with son ambulatory, in no distress wearing home supply of oxygen.

## 2013-11-19 NOTE — Patient Instructions (Signed)
[facility] Discharge Instructions for Patients Receiving Chemotherapy  Today you received the following chemotherapy agents etopiside (VP-16).  To help prevent nausea and vomiting after your treatment, we encourage you to take your nausea medication compazine 10 mg as ordered.   If you develop nausea and vomiting that is not controlled by your nausea medication, call the clinic.   BELOW ARE SYMPTOMS THAT SHOULD BE REPORTED IMMEDIATELY:  *FEVER GREATER THAN 100.5 F  *CHILLS WITH OR WITHOUT FEVER  NAUSEA AND VOMITING THAT IS NOT CONTROLLED WITH YOUR NAUSEA MEDICATION  *UNUSUAL SHORTNESS OF BREATH  *UNUSUAL BRUISING OR BLEEDING  TENDERNESS IN MOUTH AND THROAT WITH OR WITHOUT PRESENCE OF ULCERS  *URINARY PROBLEMS  *BOWEL PROBLEMS  UNUSUAL RASH Items with * indicate a potential emergency and should be followed up as soon as possible.  Feel free to call the clinic you have any questions or concerns. The clinic phone number is [phone].

## 2013-11-20 ENCOUNTER — Ambulatory Visit (HOSPITAL_BASED_OUTPATIENT_CLINIC_OR_DEPARTMENT_OTHER): Payer: Medicare Other

## 2013-11-20 VITALS — BP 111/49 | HR 94 | Temp 98.0°F

## 2013-11-20 DIAGNOSIS — C787 Secondary malignant neoplasm of liver and intrahepatic bile duct: Secondary | ICD-10-CM

## 2013-11-20 DIAGNOSIS — C3490 Malignant neoplasm of unspecified part of unspecified bronchus or lung: Secondary | ICD-10-CM

## 2013-11-20 DIAGNOSIS — Z5189 Encounter for other specified aftercare: Secondary | ICD-10-CM

## 2013-11-20 DIAGNOSIS — C7A1 Malignant poorly differentiated neuroendocrine tumors: Secondary | ICD-10-CM

## 2013-11-20 MED ORDER — PEGFILGRASTIM INJECTION 6 MG/0.6ML
6.0000 mg | Freq: Once | SUBCUTANEOUS | Status: AC
  Administered 2013-11-20: 6 mg via SUBCUTANEOUS
  Filled 2013-11-20: qty 0.6

## 2013-11-24 ENCOUNTER — Other Ambulatory Visit (HOSPITAL_BASED_OUTPATIENT_CLINIC_OR_DEPARTMENT_OTHER): Payer: Medicare Other

## 2013-11-24 DIAGNOSIS — C3490 Malignant neoplasm of unspecified part of unspecified bronchus or lung: Secondary | ICD-10-CM

## 2013-11-24 LAB — CBC WITH DIFFERENTIAL/PLATELET
BASO%: 0.6 % (ref 0.0–2.0)
BASOS ABS: 0.1 10*3/uL (ref 0.0–0.1)
EOS%: 1.1 % (ref 0.0–7.0)
Eosinophils Absolute: 0.1 10*3/uL (ref 0.0–0.5)
HEMATOCRIT: 32.3 % — AB (ref 34.8–46.6)
HGB: 10.3 g/dL — ABNORMAL LOW (ref 11.6–15.9)
LYMPH#: 0.4 10*3/uL — AB (ref 0.9–3.3)
LYMPH%: 3 % — AB (ref 14.0–49.7)
MCH: 30.3 pg (ref 25.1–34.0)
MCHC: 31.9 g/dL (ref 31.5–36.0)
MCV: 95 fL (ref 79.5–101.0)
MONO#: 0.4 10*3/uL (ref 0.1–0.9)
MONO%: 3 % (ref 0.0–14.0)
NEUT#: 11.6 10*3/uL — ABNORMAL HIGH (ref 1.5–6.5)
NEUT%: 92.3 % — AB (ref 38.4–76.8)
PLATELETS: 268 10*3/uL (ref 145–400)
RBC: 3.4 10*6/uL — ABNORMAL LOW (ref 3.70–5.45)
RDW: 18.3 % — ABNORMAL HIGH (ref 11.2–14.5)
WBC: 12.6 10*3/uL — AB (ref 3.9–10.3)
nRBC: 0 % (ref 0–0)

## 2013-11-24 LAB — COMPREHENSIVE METABOLIC PANEL (CC13)
ALBUMIN: 3.3 g/dL — AB (ref 3.5–5.0)
ALT: 12 U/L (ref 0–55)
ANION GAP: 13 meq/L — AB (ref 3–11)
AST: 13 U/L (ref 5–34)
Alkaline Phosphatase: 148 U/L (ref 40–150)
BUN: 14.2 mg/dL (ref 7.0–26.0)
CALCIUM: 9.8 mg/dL (ref 8.4–10.4)
CHLORIDE: 103 meq/L (ref 98–109)
CO2: 24 mEq/L (ref 22–29)
Creatinine: 0.6 mg/dL (ref 0.6–1.1)
Glucose: 130 mg/dl (ref 70–140)
Potassium: 4.2 mEq/L (ref 3.5–5.1)
Sodium: 141 mEq/L (ref 136–145)
Total Bilirubin: 0.38 mg/dL (ref 0.20–1.20)
Total Protein: 6.7 g/dL (ref 6.4–8.3)

## 2013-11-25 ENCOUNTER — Telehealth: Payer: Self-pay | Admitting: Internal Medicine

## 2013-11-25 ENCOUNTER — Telehealth: Payer: Self-pay | Admitting: *Deleted

## 2013-11-25 NOTE — Telephone Encounter (Signed)
lvm for pt regading to May adn June appt....sed added tx.

## 2013-11-25 NOTE — Telephone Encounter (Signed)
Called and left msg Informing pt's son that schedulers will put in cycle 5 of chemo to start on 6/3.  Onc tx schedule filled out.  SLJ

## 2013-12-03 ENCOUNTER — Other Ambulatory Visit (HOSPITAL_BASED_OUTPATIENT_CLINIC_OR_DEPARTMENT_OTHER): Payer: Medicare Other

## 2013-12-03 ENCOUNTER — Ambulatory Visit (INDEPENDENT_AMBULATORY_CARE_PROVIDER_SITE_OTHER): Payer: Medicare Other | Admitting: Emergency Medicine

## 2013-12-03 ENCOUNTER — Encounter: Payer: Self-pay | Admitting: Emergency Medicine

## 2013-12-03 VITALS — BP 134/62 | HR 82 | Ht 63.0 in | Wt 163.0 lb

## 2013-12-03 DIAGNOSIS — C3490 Malignant neoplasm of unspecified part of unspecified bronchus or lung: Secondary | ICD-10-CM

## 2013-12-03 DIAGNOSIS — C7A1 Malignant poorly differentiated neuroendocrine tumors: Secondary | ICD-10-CM

## 2013-12-03 DIAGNOSIS — J8410 Pulmonary fibrosis, unspecified: Secondary | ICD-10-CM

## 2013-12-03 DIAGNOSIS — C787 Secondary malignant neoplasm of liver and intrahepatic bile duct: Secondary | ICD-10-CM

## 2013-12-03 DIAGNOSIS — J449 Chronic obstructive pulmonary disease, unspecified: Secondary | ICD-10-CM

## 2013-12-03 DIAGNOSIS — C7B8 Other secondary neuroendocrine tumors: Secondary | ICD-10-CM

## 2013-12-03 LAB — CBC WITH DIFFERENTIAL/PLATELET
BASO%: 0.7 % (ref 0.0–2.0)
BASOS ABS: 0.2 10*3/uL — AB (ref 0.0–0.1)
EOS ABS: 0.1 10*3/uL (ref 0.0–0.5)
EOS%: 0.4 % (ref 0.0–7.0)
HCT: 34.7 % — ABNORMAL LOW (ref 34.8–46.6)
HEMOGLOBIN: 10.6 g/dL — AB (ref 11.6–15.9)
LYMPH#: 1.2 10*3/uL (ref 0.9–3.3)
LYMPH%: 4.9 % — ABNORMAL LOW (ref 14.0–49.7)
MCH: 29.6 pg (ref 25.1–34.0)
MCHC: 30.5 g/dL — ABNORMAL LOW (ref 31.5–36.0)
MCV: 96.9 fL (ref 79.5–101.0)
MONO#: 0.8 10*3/uL (ref 0.1–0.9)
MONO%: 3.3 % (ref 0.0–14.0)
NEUT%: 90.7 % — ABNORMAL HIGH (ref 38.4–76.8)
NEUTROS ABS: 21.7 10*3/uL — AB (ref 1.5–6.5)
Platelets: 164 10*3/uL (ref 145–400)
RBC: 3.58 10*6/uL — AB (ref 3.70–5.45)
RDW: 18.4 % — AB (ref 11.2–14.5)
WBC: 24 10*3/uL — ABNORMAL HIGH (ref 3.9–10.3)

## 2013-12-03 LAB — COMPREHENSIVE METABOLIC PANEL (CC13)
ALBUMIN: 3.3 g/dL — AB (ref 3.5–5.0)
ALK PHOS: 183 U/L — AB (ref 40–150)
ALT: 15 U/L (ref 0–55)
AST: 19 U/L (ref 5–34)
Anion Gap: 15 mEq/L — ABNORMAL HIGH (ref 3–11)
BUN: 13.7 mg/dL (ref 7.0–26.0)
CALCIUM: 9.5 mg/dL (ref 8.4–10.4)
CHLORIDE: 102 meq/L (ref 98–109)
CO2: 25 mEq/L (ref 22–29)
Creatinine: 0.6 mg/dL (ref 0.6–1.1)
GLUCOSE: 196 mg/dL — AB (ref 70–140)
POTASSIUM: 3.8 meq/L (ref 3.5–5.1)
SODIUM: 142 meq/L (ref 136–145)
TOTAL PROTEIN: 6.7 g/dL (ref 6.4–8.3)
Total Bilirubin: 0.23 mg/dL (ref 0.20–1.20)

## 2013-12-03 NOTE — Patient Instructions (Signed)
Please continue your inhaled medications as you are taking them We will follow up in 6 weeks to review your CT scan together

## 2013-12-03 NOTE — Assessment & Plan Note (Signed)
On pred 10, her stable dose

## 2013-12-03 NOTE — Assessment & Plan Note (Signed)
Plans to restart chemo next week, then for CT scan chest per Dr Worthy Flank plans. I would like to review with her.

## 2013-12-03 NOTE — Progress Notes (Signed)
Subjective:    Patient ID: Jessica Rollins, female    DOB: 1956/04/12    MRN: [id number] HPI 58 yo active smoker, hx of COPD, DM, OSA on CPAP, ILD (by VATS bx, on Imuran / Pred per Allie Dimmer). Admitted end of January '15 to [facility] for AE-COPD and LLL CAP. During that hospitalization she had CT scan that showed B nodules concerning for malignancy. There was some discussion about possible EBUS + ENB for diagnosis, but a R supraclavicular node   >> Bx 08/18/13 >> [facility]   ROV 10/07/13 -- follows for tobacco, OSA, ILD, new dx SCLCA. She has seen Dr Julien Nordmann and is on carboplatin, etoposide. She is due for a CT scan in 2 weeks. She is fatigued but is otherwise doing OK. She has cut her cigarettes down to 14 a day (we had agreed on 17 a day). She has been off Advair for a month - has been coughing more, feels that she misses it. Uses ProAir a couple times a day. She coughs up- mucous at night and in the am when she wakes.  rec add advair  11/03/2013 acute ov/Wert re: on advair/ spiriva/ floor of 10 mg pred/ and 02  3.5  Chief Complaint  Patient presents with  . Acute Visit    Pt c/o chills, low grade temp and cough x 4 days. Cough is prod with minimal yellow sputum.  She has also been more SOB since other symptoms started and has been using albuterol neb about 3 x per day.   off acei for "sev months" coughing at baseline x one month then acutely worse x 4 days prior to OV   - sob better p neb and comfortable at rest  >>Levaquin x 10 d   Follow up PNA 11/12/13 --  Returns for 1 week follow up for PNA .  Seen last week with increased cough/congestion  CXR last ov showed increased basilar opacities c/w PNA  tx w/ levaquin x 10 d.  Reports is about 80% better but still having some dyspnea, prod cough with clear/yellow mucus.   Denies wheezing, tightnes, f/c/s, hemoptysis.  finished Levaquin this AM. No fever, hemoptyisis, chest pain , orthopnea, or edema.  Currently on prednisone 20mg  daily (baseline 10mg  daily )    Known metastatic small cell lung cancer undergoing systemic chemo  Chemo on hold this week , to resume next week.    ROV 12/03/13 -- follows for tobacco, OSA, ILD on chronic pred, SCLCA on chemo (Dr Julien Nordmann). Her chemo had to be delayed while she was treated for PNA. She has some of her strength back. Planning for chemo restart on 12/10/13.    Objective:   Physical Exam Filed Vitals:   12/03/13 0929  BP: 134/62  Pulse: 82  Height: 5\' 3"  (1.6 m)  Weight: 163 lb (73.936 kg)  SpO2: 96%   Gen: hoarse amb wf   ENT: No lesions,  mouth clear,  oropharynx clear, no postnasal drip  Neck: No JVD, no TMG, no carotid bruits  Lungs: No use of accessory muscles, no dullness to percussion, bibasilar crackles   Cardiovascular: RRR, heart sounds normal, no murmur or gallops, no peripheral edema  Musculoskeletal: No deformities, no cyanosis or clubbing  Neuro: alert, non focal  Skin: Warm, no lesions or rashes    CXR  11/03/2013 :  1. Increased basilar lung opacity when compared to prior studies consistent with pneumonia superimposed on chronic scarring/atelectasis. 2. COPD.  CXR >Stable pulmonary fibrosis. No superimposed  acute findings are  identified.       Assessment & Plan:  Pulmonary fibrosis On pred 10, her stable dose  COPD (chronic obstructive pulmonary disease) Continue inhaled regimen  Small cell lung carcinoma Plans to restart chemo next week, then for CT scan chest per Dr Worthy Flank plans. I would like to review with her.

## 2013-12-03 NOTE — Assessment & Plan Note (Signed)
Continue inhaled regimen

## 2013-12-08 ENCOUNTER — Ambulatory Visit (HOSPITAL_COMMUNITY)
Admission: RE | Admit: 2013-12-08 | Discharge: 2013-12-08 | Disposition: A | Discharge status: Home / Self Care | Payer: Medicare Other | Source: Ambulatory Visit | Attending: Internal Medicine | Admitting: Internal Medicine

## 2013-12-08 ENCOUNTER — Other Ambulatory Visit (HOSPITAL_BASED_OUTPATIENT_CLINIC_OR_DEPARTMENT_OTHER): Payer: Medicare Other

## 2013-12-08 DIAGNOSIS — R911 Solitary pulmonary nodule: Secondary | ICD-10-CM | POA: Insufficient documentation

## 2013-12-08 DIAGNOSIS — C787 Secondary malignant neoplasm of liver and intrahepatic bile duct: Secondary | ICD-10-CM | POA: Insufficient documentation

## 2013-12-08 DIAGNOSIS — R0602 Shortness of breath: Secondary | ICD-10-CM | POA: Insufficient documentation

## 2013-12-08 DIAGNOSIS — R059 Cough, unspecified: Secondary | ICD-10-CM | POA: Insufficient documentation

## 2013-12-08 DIAGNOSIS — R05 Cough: Secondary | ICD-10-CM | POA: Insufficient documentation

## 2013-12-08 DIAGNOSIS — C7951 Secondary malignant neoplasm of bone: Secondary | ICD-10-CM | POA: Insufficient documentation

## 2013-12-08 DIAGNOSIS — C3490 Malignant neoplasm of unspecified part of unspecified bronchus or lung: Secondary | ICD-10-CM | POA: Insufficient documentation

## 2013-12-08 DIAGNOSIS — C7A1 Malignant poorly differentiated neuroendocrine tumors: Secondary | ICD-10-CM

## 2013-12-08 DIAGNOSIS — C7952 Secondary malignant neoplasm of bone marrow: Secondary | ICD-10-CM

## 2013-12-08 DIAGNOSIS — Z9221 Personal history of antineoplastic chemotherapy: Secondary | ICD-10-CM | POA: Insufficient documentation

## 2013-12-08 DIAGNOSIS — R599 Enlarged lymph nodes, unspecified: Secondary | ICD-10-CM | POA: Insufficient documentation

## 2013-12-08 LAB — COMPREHENSIVE METABOLIC PANEL (CC13)
ALK PHOS: 165 U/L — AB (ref 40–150)
ALT: 19 U/L (ref 0–55)
AST: 29 U/L (ref 5–34)
Albumin: 3.2 g/dL — ABNORMAL LOW (ref 3.5–5.0)
Anion Gap: 16 mEq/L — ABNORMAL HIGH (ref 3–11)
BILIRUBIN TOTAL: 0.32 mg/dL (ref 0.20–1.20)
BUN: 13.3 mg/dL (ref 7.0–26.0)
CO2: 22 mEq/L (ref 22–29)
Calcium: 9.5 mg/dL (ref 8.4–10.4)
Chloride: 102 mEq/L (ref 98–109)
Creatinine: 0.6 mg/dL (ref 0.6–1.1)
Glucose: 135 mg/dl (ref 70–140)
POTASSIUM: 4.2 meq/L (ref 3.5–5.1)
Sodium: 141 mEq/L (ref 136–145)
Total Protein: 6.6 g/dL (ref 6.4–8.3)

## 2013-12-08 LAB — CBC WITH DIFFERENTIAL/PLATELET
BASO%: 0.6 % (ref 0.0–2.0)
Basophils Absolute: 0.2 10*3/uL — ABNORMAL HIGH (ref 0.0–0.1)
EOS%: 0.8 % (ref 0.0–7.0)
Eosinophils Absolute: 0.2 10*3/uL (ref 0.0–0.5)
HCT: 33 % — ABNORMAL LOW (ref 34.8–46.6)
HGB: 10.2 g/dL — ABNORMAL LOW (ref 11.6–15.9)
LYMPH%: 6.6 % — AB (ref 14.0–49.7)
MCH: 29.8 pg (ref 25.1–34.0)
MCHC: 30.9 g/dL — AB (ref 31.5–36.0)
MCV: 96.5 fL (ref 79.5–101.0)
MONO#: 0.9 10*3/uL (ref 0.1–0.9)
MONO%: 4 % (ref 0.0–14.0)
NEUT#: 20.5 10*3/uL — ABNORMAL HIGH (ref 1.5–6.5)
NEUT%: 88 % — ABNORMAL HIGH (ref 38.4–76.8)
PLATELETS: 259 10*3/uL (ref 145–400)
RBC: 3.42 10*6/uL — ABNORMAL LOW (ref 3.70–5.45)
RDW: 19.2 % — AB (ref 11.2–14.5)
WBC: 23.3 10*3/uL — ABNORMAL HIGH (ref 3.9–10.3)
lymph#: 1.5 10*3/uL (ref 0.9–3.3)

## 2013-12-08 MED ORDER — IOHEXOL 300 MG/ML  SOLN
100.0000 mL | Freq: Once | INTRAMUSCULAR | Status: AC | PRN
  Administered 2013-12-08: 100 mL via INTRAVENOUS

## 2013-12-10 ENCOUNTER — Telehealth: Payer: Self-pay | Admitting: *Deleted

## 2013-12-10 ENCOUNTER — Other Ambulatory Visit: Payer: Self-pay | Admitting: *Deleted

## 2013-12-10 ENCOUNTER — Telehealth: Payer: Self-pay | Admitting: Internal Medicine

## 2013-12-10 ENCOUNTER — Ambulatory Visit: Payer: Medicare Other

## 2013-12-10 ENCOUNTER — Ambulatory Visit (HOSPITAL_BASED_OUTPATIENT_CLINIC_OR_DEPARTMENT_OTHER): Payer: Medicare Other | Admitting: Internal Medicine

## 2013-12-10 ENCOUNTER — Encounter: Payer: Self-pay | Admitting: Internal Medicine

## 2013-12-10 VITALS — BP 113/57 | HR 102 | Temp 98.4°F | Resp 21 | Ht 63.0 in | Wt 163.6 lb

## 2013-12-10 DIAGNOSIS — F17200 Nicotine dependence, unspecified, uncomplicated: Secondary | ICD-10-CM

## 2013-12-10 DIAGNOSIS — C3490 Malignant neoplasm of unspecified part of unspecified bronchus or lung: Secondary | ICD-10-CM

## 2013-12-10 DIAGNOSIS — C787 Secondary malignant neoplasm of liver and intrahepatic bile duct: Secondary | ICD-10-CM

## 2013-12-10 DIAGNOSIS — C7A1 Malignant poorly differentiated neuroendocrine tumors: Secondary | ICD-10-CM

## 2013-12-10 DIAGNOSIS — C7B8 Other secondary neuroendocrine tumors: Secondary | ICD-10-CM

## 2013-12-10 DIAGNOSIS — M549 Dorsalgia, unspecified: Secondary | ICD-10-CM

## 2013-12-10 NOTE — Progress Notes (Signed)
[place] [phone]   [phone]  OFFICE PROGRESS NOTE  Ramond Dial, MD [address] [name] [address]  DIAGNOSIS: Extensive stage small cell lung cancer diagnosed in February of 2015.  PRIOR THERAPY: Systemic chemotherapy with carboplatin for AUC of 5 on day 1 and etoposide 100 mg/M2 on days 1, 2 and 3 with Neulasta support on day 4. She is status post 4 cycles.    CURRENT THERAPY:   INTERVAL HISTORY: Jimi Giza 58 y.o. female returns to the clinic today for followup visit accompanied by her son. The patient tolerated the fourth cycle of her systemic chemotherapy fairly well with no significant adverse effects. She continues to complain of fatigue as well as pain in the lower back. Unfortunately the patient continues to smoke and I strongly encouraged her to quit smoking and offered her smoke cessation programs but she declined. She denied having any significant fever or chills. She denied having any chest pain but continues to have the baseline shortness of breath and she is currently on home oxygen, no cough or hemoptysis. She has no significant weight loss or night sweats. She has repeat CT scan of the chest, abdomen and pelvis performed recently and she is here for evaluation and discussion of her scan results.  MEDICAL HISTORY: Past Medical History  Diagnosis Date  . COPD (chronic obstructive pulmonary disease)   . Pulmonary fibrosis     diagnosed with this 2001.    . Diabetes mellitus without complication   . Hiatal hernia   . Depression   . Anxiety   . High cholesterol   . Hypertension   . Osteoporosis   . OSA on CPAP     13cm  . Overactive bladder   . Heart murmur     ALLERGIES:  has No Known Allergies.  MEDICATIONS:  Current Outpatient Prescriptions  Medication Sig Dispense Refill  . aspirin 325 MG tablet Take 325 mg by mouth daily.      Marland Kitchen azaTHIOprine (IMURAN) 50 MG tablet Take 50 mg by mouth 2  (two) times daily.      . calcium citrate-vitamin D (CITRACAL+D) 315-200 MG-UNIT per tablet Take 1 tablet by mouth daily.      . Cimetidine (TAGAMET PO) Take 1 tablet by mouth daily.      . cyanocobalamin 1000 MCG tablet Take 100 mcg by mouth daily.      Marland Kitchen esomeprazole (NEXIUM) 40 MG capsule Take 40 mg by mouth daily before breakfast.      . Fluticasone-Salmeterol (ADVAIR DISKUS) 250-50 MCG/DOSE AEPB Inhale 1 puff into the lungs 2 (two) times daily.  60 each  5  . folic acid (FOLVITE) 1 MG tablet Take 1 mg by mouth 2 (two) times daily.      Marland Kitchen guaiFENesin (MUCINEX) 600 MG 12 hr tablet Take 600 mg by mouth 2 (two) times daily.      . Insulin Detemir (LEVEMIR FLEXPEN [place]) Inject 20 Units into the skin daily.      . IRON PO Take 1 tablet by mouth 4 (four) times daily.      Marland Kitchen LORazepam (ATIVAN) 1 MG tablet Take 1 mg by mouth at bedtime.      . metFORMIN (GLUCOPHAGE) 500 MG tablet Take 1,000 mg by mouth 2 (two) times daily with a meal.       . Multiple Vitamin (MULTIVITAMIN WITH MINERALS) TABS tablet Take 1 tablet by mouth daily.      Marland Kitchen  niacin 500 MG tablet Take 500 mg by mouth daily.      . Omega-3 Fatty Acids (FISH OIL) 300 MG CAPS Take 1 capsule by mouth daily.      Marland Kitchen oxybutynin (DITROPAN-XL) 10 MG 24 hr tablet Take 10 mg by mouth daily.      Marland Kitchen PARoxetine (PAXIL) 30 MG tablet Take 30 mg by mouth daily.      . predniSONE (DELTASONE) 10 MG tablet Take 10 mg by mouth daily with breakfast.       . rosuvastatin (CRESTOR) 10 MG tablet Take 10 mg by mouth daily.      Marland Kitchen SPIRIVA HANDIHALER 18 MCG inhalation capsule Place 18 mcg into inhaler and inhale daily.       . traMADol (ULTRAM) 50 MG tablet 1-2 every 4 hours as needed for cough or pain  40 tablet  0  . vitamin E 400 UNIT capsule Take 400 Units by mouth daily.      Marland Kitchen albuterol (PROVENTIL) (2.5 MG/3ML) 0.083% nebulizer solution Take 3 mLs (2.5 mg total) by nebulization every 2 (two) hours as needed for wheezing or shortness of breath.  75 mL  12  .  prochlorperazine (COMPAZINE) 10 MG tablet Take 1 tablet (10 mg total) by mouth every 6 (six) hours as needed for nausea or vomiting.  30 tablet  1   No current facility-administered medications for this visit.    SURGICAL HISTORY:  Past Surgical History  Procedure Laterality Date  . Bladder suspension    . Abdominal hysterectomy      severe bleeding-when she was in her 30's  . Lung biopsy      REVIEW OF SYSTEMS:  Constitutional: positive for fatigue Eyes: negative Ears, nose, mouth, throat, and face: negative Respiratory: positive for dyspnea on exertion Cardiovascular: negative Gastrointestinal: negative Genitourinary:negative Integument/breast: negative Hematologic/lymphatic: negative Musculoskeletal:positive for back pain Neurological: negative Behavioral/Psych: negative Endocrine: negative Allergic/Immunologic: negative   PHYSICAL EXAMINATION: General appearance: alert, cooperative, fatigued and no distress Head: Normocephalic, without obvious abnormality, atraumatic Neck: no adenopathy, no JVD, supple, symmetrical, trachea midline and thyroid not enlarged, symmetric, no tenderness/mass/nodules Lymph nodes: Cervical, supraclavicular, and axillary nodes normal. Resp: clear to auscultation bilaterally Back: symmetric, no curvature. ROM normal. No CVA tenderness. Cardio: regular rate and rhythm, S1, S2 normal, no murmur, click, rub or gallop GI: soft, non-tender; bowel sounds normal; no masses,  no organomegaly Extremities: extremities normal, atraumatic, no cyanosis or edema Neurologic: Alert and oriented X 3, normal strength and tone. Normal symmetric reflexes. Normal coordination and gait  ECOG PERFORMANCE STATUS: 1 - Symptomatic but completely ambulatory  Blood pressure 113/57, pulse 102, temperature 98.4 F (36.9 C), temperature source Oral, resp. rate 21, height 5\' 3"  (1.6 m), weight 163 lb 9.6 oz (74.208 kg), SpO2 94.00%.  LABORATORY DATA: Lab Results  Component  Value Date   WBC 23.3* 12/08/2013   HGB 10.2* 12/08/2013   HCT 33.0* 12/08/2013   MCV 96.5 12/08/2013   PLT 259 12/08/2013      Chemistry      Component Value Date/Time   NA 141 12/08/2013 0825   NA 137 08/18/2013 0742   K 4.2 12/08/2013 0825   K 4.5 08/18/2013 0742   CL 95* 08/18/2013 0742   CO2 22 12/08/2013 0825   CO2 31 08/18/2013 0742   BUN 13.3 12/08/2013 0825   BUN 23 08/18/2013 0742   CREATININE 0.6 12/08/2013 0825   CREATININE 0.40* 08/18/2013 0742      Component Value Date/Time  CALCIUM 9.5 12/08/2013 0825   CALCIUM 9.2 08/18/2013 0742   ALKPHOS 165* 12/08/2013 0825   ALKPHOS 104 08/12/2013 0721   AST 29 12/08/2013 0825   AST 16 08/12/2013 0721   ALT 19 12/08/2013 0825   ALT 16 08/12/2013 0721   BILITOT 0.32 12/08/2013 0825   BILITOT 0.3 08/12/2013 0721       RADIOGRAPHIC STUDIES: Dg Chest 2 View  11/12/2013   CLINICAL DATA:  58 year old female cough shortness of breath. History of lung cancer and pulmonary fibrosis. Initial encounter.  EXAM: CHEST  2 VIEW  COMPARISON:  Chest CT 10/17/2013 and earlier. Chest radiographs 11/03/2013.  FINDINGS: Stable lung volumes. Stable cardiac size and mediastinal contours. No pneumothorax. No pulmonary edema or pleural effusion suspected. No new or worsening pulmonary opacity identified. Stable visualized osseous structures.  IMPRESSION: Stable pulmonary fibrosis. No superimposed acute findings are identified.   Electronically Signed   By: Lars Pinks M.D.   On: 11/12/2013 11:21   Ct Chest W Contrast  12/08/2013   CLINICAL DATA:  Lung cancer diagnosed 2015. Ongoing chemotherapy. Shortness of breath and cough.  EXAM: CT CHEST AND ABDOMEN WITH CONTRAST  TECHNIQUE: Multidetector CT imaging of the chest and abdomen was performed following the standard protocol during bolus administration of intravenous contrast.  CONTRAST:  124mL OMNIPAQUE IOHEXOL 300 MG/ML  SOLN  COMPARISON:  Chest CT 08/10/2013, 10/17/2013  FINDINGS:   CT CHEST FINDINGS  Confluent centrally-necrotic dominant  paratracheal lymphadenopathy has increased, 5.1 x 3.5 cm image 22 compared to 3.8 x 2.7 cm at the same anatomic level previously.  Increased AP window lymphadenopathy, 1.2 cm image 25, previously 0.9 cm.  Increased right perihilar lymphadenopathy, 1.6 cm image 26 previously 0.9 cm. No appreciable narrowing of the right mainstem bronchus by adjacent lymphadenopathy. Central airways are patent.  Spiculated 0.8 cm right upper lobe pulmonary nodule image 29 is stable. Irregular superior segment right lower lobe pulmonary nodule is stable, 1.2 cm image 40. Patchy areas of subpleural reticular opacity are reidentified with central bronchial wall thickening. Emphysematous changes are again noted. Increased basal right upper lobe volume loss. No pleural effusion or pneumothorax.    CT ABDOMEN FINDINGS  Innumerable sclerotic osseous metastases reidentified. Bilateral subacute and remote rib deformities are reidentified. No acute osseous abnormality is identified.  New and enlarging hepatic metastases are identified, representative posterior segment right hepatic lobe mass now 3.2 cm image 62, previously 0.9 cm. Confluent lateral segment left hepatic lobe masses measure 6.0 x 3.2 cm image 59, previously 3.1 x 1.5 cm at the same anatomic level. Possible dependent gallstone, sludge, or polyp versus volume averaging artifact in the gallbladder image 72. A stone is favored as this finding was seen on the prior exam as well. No other CT evidence for acute cholecystitis. Metastasis is felt less likely but may occur on the gallbladder. Adrenal glands, spleen, kidneys, and pancreas are normal. No visualized bowel wall thickening or focal segmental dilatation. Minimal nodularity subjectively of the right adrenal gland anterior limb image 65 is stable without measurable mass. Moderate atheromatous aortic calcification without aneurysm.    IMPRESSION: Progression of mediastinal, right hilar, and left AP window lymphadenopathy  compatible with intrathoracic metastatic disease.  Stable right-sided pulmonary nodules.  Progression of hepatic metastatic disease.  Innumerable sclerotic osseous metastasis reidentified.  Probable gallstone without other CT evidence for acute cholecystitis.   Electronically Signed   By: Conchita Paris M.D.   On: 12/08/2013 11:59    ASSESSMENT AND PLAN: This is  a very pleasant 58 years old white female with extensive stage small cell lung cancer currently undergoing systemic chemotherapy with carboplatin and etoposide status post 4 cycles. She tolerated her treatment fairly well with no significant adverse effects. Unfortunately her last CT scan of the chest, abdomen and pelvis showed evidence for disease progression in the chest in addition to liver. I discussed the scan results and showed the images to the patient and her son. I recommended for her to discontinue her current treatment with carboplatin and etoposide. I gave the patient the option of second line chemotherapy with cisplatin 30 mg/M2 and irinotecan 65 mg/M2 on days 1 and 8 every 3 weeks versus consideration of palliative care and hospice referral. The patient is interested in proceeding with chemotherapy. She is expected to start the first cycle of this treatment next week. I discussed with the patient and her son the adverse effect of this treatment including but not limited to alopecia, myelosuppression, nausea and vomiting, peripheral neuropathy, liver or renal dysfunction. She will come back for followup visit with the start of cycle #2. For smoke cessation, I strongly encouraged the patient to quit smoking and offered her smoke cessation programs but she declined. For the back pain, I will start the patient on Xgeva for her bone disease. She denied having any gum or dental issues and she understands the risk of this treatment including osteonecrosis of the jaw.  I will refer her to radiation oncology for evaluation and  consideration of palliative radiotherapy to that area. She was advised to call immediately if she has any concerning symptoms in the interval. The patient voices understanding of current disease status and treatment options and is in agreement with the current care plan.  All questions were answered. The patient knows to call the clinic with any problems, questions or concerns. We can certainly see the patient much sooner if necessary.  Disclaimer: This note was dictated with voice recognition software. Similar sounding words can inadvertently be transcribed and may not be corrected upon review.

## 2013-12-10 NOTE — Telephone Encounter (Signed)
gv adn printed aptp sched and avs for pt for June and July....sed added tx....pt sched with Dr. Lisbeth Renshaw on 6.10 @ 2pm

## 2013-12-10 NOTE — Patient Instructions (Signed)
Smoking Cessation, Tips for Success If you are ready to quit smoking, congratulations! You have chosen to help yourself be healthier. Cigarettes bring nicotine, tar, carbon monoxide, and other irritants into your body. Your lungs, heart, and blood vessels will be able to work better without these poisons. There are many different ways to quit smoking. Nicotine gum, nicotine patches, a nicotine inhaler, or nicotine nasal spray can help with physical craving. Hypnosis, support groups, and medicines help break the habit of smoking. WHAT THINGS CAN I DO TO MAKE QUITTING EASIER?  Here are some tips to help you quit for good:  Pick a date when you will quit smoking completely. Tell all of your friends and family about your plan to quit on that date.  Do not try to slowly cut down on the number of cigarettes you are smoking. Pick a quit date and quit smoking completely starting on that day.  Throw away all cigarettes.   Clean and remove all ashtrays from your home, work, and car.   On a card, write down your reasons for quitting. Carry the card with you and read it when you get the urge to smoke.   Cleanse your body of nicotine. Drink enough water and fluids to keep your urine clear or pale yellow. Do this after quitting to flush the nicotine from your body.   Learn to predict your moods. Do not let a bad situation be your excuse to have a cigarette. Some situations in your life might tempt you into wanting a cigarette.   Never have "just one" cigarette. It leads to wanting another and another. Remind yourself of your decision to quit.   Change habits associated with smoking. If you smoked while driving or when feeling stressed, try other activities to replace smoking. Stand up when drinking your coffee. Brush your teeth after eating. Sit in a different chair when you read the paper. Avoid alcohol while trying to quit, and try to drink fewer caffeinated beverages. Alcohol and caffeine may urge  you to smoke.   Avoid foods and drinks that can trigger a desire to smoke, such as sugary or spicy foods and alcohol.   Ask people who smoke not to smoke around you.   Have something planned to do right after eating or having a cup of coffee. For example, plan to take a walk or exercise.   Try a relaxation exercise to calm you down and decrease your stress. Remember, you may be tense and nervous for the first 2 weeks after you quit, but this will pass.   Find new activities to keep your hands busy. Play with a pen, coin, or rubber band. Doodle or draw things on paper.   Brush your teeth right after eating. This will help cut down on the craving for the taste of tobacco after meals. You can also try mouthwash.   Use oral substitutes in place of cigarettes. Try using lemon drops, carrots, cinnamon sticks, or chewing gum. Keep them handy so they are available when you have the urge to smoke.   When you have the urge to smoke, try deep breathing.   Designate your home as a nonsmoking area.   If you are a heavy smoker, ask your health care provider about a prescription for nicotine chewing gum. It can ease your withdrawal from nicotine.   Reward yourself. Set aside the cigarette money you save and buy yourself something nice.   Look for support from others. Join a support group or   smoking cessation program. Ask someone at home or at work to help you with your plan to quit smoking.   Always ask yourself, "Do I need this cigarette or is this just a reflex?" Tell yourself, "Today, I choose not to smoke," or "I do not want to smoke." You are reminding yourself of your decision to quit.  Do not replace cigarette smoking with electronic cigarettes (commonly called e-cigarettes). The safety of e-cigarettes is unknown, and some may contain harmful chemicals.  If you relapse, do not give up! Plan ahead and think about what you will do the next time you get the urge to smoke.  HOW WILL  I FEEL WHEN I QUIT SMOKING? You may have symptoms of withdrawal because your body is used to nicotine (the addictive substance in cigarettes). You may crave cigarettes, be irritable, feel very hungry, cough often, get headaches, or have difficulty concentrating. The withdrawal symptoms are only temporary. They are strongest when you first quit but will go away within 10 14 days. When withdrawal symptoms occur, stay in control. Think about your reasons for quitting. Remind yourself that these are signs that your body is healing and getting used to being without cigarettes. Remember that withdrawal symptoms are easier to treat than the major diseases that smoking can cause.  Even after the withdrawal is over, expect periodic urges to smoke. However, these cravings are generally short lived and will go away whether you smoke or not. Do not smoke!  WHAT RESOURCES ARE AVAILABLE TO HELP ME QUIT SMOKING? Your health care provider can direct you to community resources or hospitals for support, which may include:  Group support.  Education.  Hypnosis.  Therapy. Document Released: 03/24/2004 Document Revised: 04/16/2013 Document Reviewed: 12/12/2012 [employer] Patient Information 2014 [employer].  

## 2013-12-10 NOTE — Telephone Encounter (Signed)
Per staff message and POF I have scheduled appts.  JMW  

## 2013-12-11 ENCOUNTER — Ambulatory Visit: Payer: Medicare Other

## 2013-12-12 ENCOUNTER — Ambulatory Visit: Payer: Medicare Other

## 2013-12-12 ENCOUNTER — Telehealth: Payer: Self-pay | Admitting: Medical Oncology

## 2013-12-12 DIAGNOSIS — C3490 Malignant neoplasm of unspecified part of unspecified bronchus or lung: Secondary | ICD-10-CM

## 2013-12-12 MED ORDER — OXYCODONE-ACETAMINOPHEN 5-325 MG PO TABS: 1.0000 | ORAL_TABLET | ORAL | Status: DC | PRN

## 2013-12-12 NOTE — Telephone Encounter (Signed)
Son reported that pt called her crying in tears with pain. Tramadol and aleve are not helping . I spoke to pt and she is having pain down both legs and back . She also reports her bottom lip across to the  left side of her  chin is numb.

## 2013-12-12 NOTE — Telephone Encounter (Signed)
Per Jessica Rollins- Rx for percocet locked in injection room. I instructed her son to pick up rx today adn to take pt to [facility] if numbness does not resolve or progresses. He voices understanding.

## 2013-12-13 ENCOUNTER — Ambulatory Visit: Payer: Medicare Other

## 2013-12-15 ENCOUNTER — Emergency Department (HOSPITAL_COMMUNITY)
Admission: EM | Admit: 2013-12-15 | Discharge: 2013-12-15 | Disposition: A | Discharge status: Home / Self Care | Payer: Medicare Other | Attending: Emergency Medicine | Admitting: Emergency Medicine

## 2013-12-15 ENCOUNTER — Telehealth: Payer: Self-pay | Admitting: *Deleted

## 2013-12-15 ENCOUNTER — Encounter (HOSPITAL_COMMUNITY): Payer: Self-pay | Admitting: Emergency Medicine

## 2013-12-15 ENCOUNTER — Other Ambulatory Visit (HOSPITAL_BASED_OUTPATIENT_CLINIC_OR_DEPARTMENT_OTHER): Payer: Medicare Other

## 2013-12-15 ENCOUNTER — Ambulatory Visit (HOSPITAL_BASED_OUTPATIENT_CLINIC_OR_DEPARTMENT_OTHER): Payer: Medicare Other

## 2013-12-15 VITALS — BP 102/49 | HR 95 | Temp 98.0°F | Resp 20

## 2013-12-15 DIAGNOSIS — Z5111 Encounter for antineoplastic chemotherapy: Secondary | ICD-10-CM

## 2013-12-15 DIAGNOSIS — C7B8 Other secondary neuroendocrine tumors: Secondary | ICD-10-CM

## 2013-12-15 DIAGNOSIS — Z79899 Other long term (current) drug therapy: Secondary | ICD-10-CM | POA: Insufficient documentation

## 2013-12-15 DIAGNOSIS — F17200 Nicotine dependence, unspecified, uncomplicated: Secondary | ICD-10-CM | POA: Insufficient documentation

## 2013-12-15 DIAGNOSIS — F329 Major depressive disorder, single episode, unspecified: Secondary | ICD-10-CM | POA: Insufficient documentation

## 2013-12-15 DIAGNOSIS — Z87448 Personal history of other diseases of urinary system: Secondary | ICD-10-CM | POA: Insufficient documentation

## 2013-12-15 DIAGNOSIS — F411 Generalized anxiety disorder: Secondary | ICD-10-CM | POA: Insufficient documentation

## 2013-12-15 DIAGNOSIS — Z85118 Personal history of other malignant neoplasm of bronchus and lung: Secondary | ICD-10-CM | POA: Insufficient documentation

## 2013-12-15 DIAGNOSIS — Z9981 Dependence on supplemental oxygen: Secondary | ICD-10-CM | POA: Insufficient documentation

## 2013-12-15 DIAGNOSIS — J4489 Other specified chronic obstructive pulmonary disease: Secondary | ICD-10-CM | POA: Insufficient documentation

## 2013-12-15 DIAGNOSIS — C7951 Secondary malignant neoplasm of bone: Secondary | ICD-10-CM | POA: Insufficient documentation

## 2013-12-15 DIAGNOSIS — M549 Dorsalgia, unspecified: Secondary | ICD-10-CM

## 2013-12-15 DIAGNOSIS — J449 Chronic obstructive pulmonary disease, unspecified: Secondary | ICD-10-CM | POA: Insufficient documentation

## 2013-12-15 DIAGNOSIS — C3490 Malignant neoplasm of unspecified part of unspecified bronchus or lung: Secondary | ICD-10-CM

## 2013-12-15 DIAGNOSIS — R109 Unspecified abdominal pain: Secondary | ICD-10-CM | POA: Insufficient documentation

## 2013-12-15 DIAGNOSIS — I10 Essential (primary) hypertension: Secondary | ICD-10-CM | POA: Insufficient documentation

## 2013-12-15 DIAGNOSIS — G4733 Obstructive sleep apnea (adult) (pediatric): Secondary | ICD-10-CM | POA: Insufficient documentation

## 2013-12-15 DIAGNOSIS — IMO0002 Reserved for concepts with insufficient information to code with codable children: Secondary | ICD-10-CM | POA: Insufficient documentation

## 2013-12-15 DIAGNOSIS — C7A1 Malignant poorly differentiated neuroendocrine tumors: Secondary | ICD-10-CM

## 2013-12-15 DIAGNOSIS — M25559 Pain in unspecified hip: Secondary | ICD-10-CM | POA: Insufficient documentation

## 2013-12-15 DIAGNOSIS — E119 Type 2 diabetes mellitus without complications: Secondary | ICD-10-CM | POA: Insufficient documentation

## 2013-12-15 DIAGNOSIS — Z8739 Personal history of other diseases of the musculoskeletal system and connective tissue: Secondary | ICD-10-CM | POA: Insufficient documentation

## 2013-12-15 DIAGNOSIS — C787 Secondary malignant neoplasm of liver and intrahepatic bile duct: Secondary | ICD-10-CM

## 2013-12-15 DIAGNOSIS — M79609 Pain in unspecified limb: Secondary | ICD-10-CM

## 2013-12-15 DIAGNOSIS — E7800 Pure hypercholesterolemia, unspecified: Secondary | ICD-10-CM | POA: Insufficient documentation

## 2013-12-15 DIAGNOSIS — R011 Cardiac murmur, unspecified: Secondary | ICD-10-CM | POA: Insufficient documentation

## 2013-12-15 DIAGNOSIS — F3289 Other specified depressive episodes: Secondary | ICD-10-CM | POA: Insufficient documentation

## 2013-12-15 DIAGNOSIS — Z794 Long term (current) use of insulin: Secondary | ICD-10-CM | POA: Insufficient documentation

## 2013-12-15 DIAGNOSIS — G893 Neoplasm related pain (acute) (chronic): Secondary | ICD-10-CM | POA: Insufficient documentation

## 2013-12-15 DIAGNOSIS — Z8719 Personal history of other diseases of the digestive system: Secondary | ICD-10-CM | POA: Insufficient documentation

## 2013-12-15 DIAGNOSIS — C7952 Secondary malignant neoplasm of bone marrow: Secondary | ICD-10-CM

## 2013-12-15 HISTORY — DX: Malignant (primary) neoplasm, unspecified: C80.1

## 2013-12-15 LAB — CBC WITH DIFFERENTIAL/PLATELET
BASO%: 1.1 % (ref 0.0–2.0)
Basophils Absolute: 0.1 10*3/uL (ref 0.0–0.1)
EOS%: 0.4 % (ref 0.0–7.0)
Eosinophils Absolute: 0 10*3/uL (ref 0.0–0.5)
HEMATOCRIT: 27.6 % — AB (ref 34.8–46.6)
HGB: 8.6 g/dL — ABNORMAL LOW (ref 11.6–15.9)
LYMPH%: 10.4 % — AB (ref 14.0–49.7)
MCH: 29.3 pg (ref 25.1–34.0)
MCHC: 31.2 g/dL — AB (ref 31.5–36.0)
MCV: 93.9 fL (ref 79.5–101.0)
MONO#: 0.3 10*3/uL (ref 0.1–0.9)
MONO%: 2.8 % (ref 0.0–14.0)
NEUT#: 9.2 10*3/uL — ABNORMAL HIGH (ref 1.5–6.5)
NEUT%: 85.3 % — AB (ref 38.4–76.8)
PLATELETS: 139 10*3/uL — AB (ref 145–400)
RBC: 2.94 10*6/uL — AB (ref 3.70–5.45)
RDW: 19.2 % — ABNORMAL HIGH (ref 11.2–14.5)
WBC: 10.8 10*3/uL — AB (ref 3.9–10.3)
lymph#: 1.1 10*3/uL (ref 0.9–3.3)
nRBC: 6 % — ABNORMAL HIGH (ref 0–0)

## 2013-12-15 LAB — COMPREHENSIVE METABOLIC PANEL (CC13)
ALT: 27 U/L (ref 0–55)
ANION GAP: 16 meq/L — AB (ref 3–11)
AST: 45 U/L — AB (ref 5–34)
Albumin: 3 g/dL — ABNORMAL LOW (ref 3.5–5.0)
Alkaline Phosphatase: 185 U/L — ABNORMAL HIGH (ref 40–150)
BUN: 33.7 mg/dL — AB (ref 7.0–26.0)
CO2: 19 mEq/L — ABNORMAL LOW (ref 22–29)
CREATININE: 0.6 mg/dL (ref 0.6–1.1)
Calcium: 9.1 mg/dL (ref 8.4–10.4)
Chloride: 103 mEq/L (ref 98–109)
Glucose: 183 mg/dl — ABNORMAL HIGH (ref 70–140)
Potassium: 4.1 mEq/L (ref 3.5–5.1)
Sodium: 139 mEq/L (ref 136–145)
Total Bilirubin: 0.39 mg/dL (ref 0.20–1.20)
Total Protein: 6.5 g/dL (ref 6.4–8.3)

## 2013-12-15 LAB — MAGNESIUM (CC13): Magnesium: 2.3 mg/dl (ref 1.5–2.5)

## 2013-12-15 MED ORDER — KETOROLAC TROMETHAMINE 15 MG/ML IJ SOLN
15.0000 mg | Freq: Once | INTRAMUSCULAR | Status: AC
  Administered 2013-12-15: 15 mg via INTRAVENOUS
  Filled 2013-12-15: qty 1

## 2013-12-15 MED ORDER — SODIUM CHLORIDE 0.9 % IV SOLN
INTRAVENOUS | Status: DC
Start: 2013-12-15 — End: 2013-12-15
  Administered 2013-12-15: 16:00:00 via INTRAVENOUS

## 2013-12-15 MED ORDER — MORPHINE SULFATE ER 15 MG PO TBCR: 15.0000 mg | EXTENDED_RELEASE_TABLET | Freq: Two times a day (BID) | ORAL | Status: AC

## 2013-12-15 MED ORDER — MORPHINE SULFATE 4 MG/ML IJ SOLN
2.0000 mg | Freq: Once | INTRAMUSCULAR | Status: AC
  Administered 2013-12-15: 2 mg via INTRAVENOUS

## 2013-12-15 MED ORDER — DOCUSATE SODIUM 100 MG PO CAPS: 100.0000 mg | ORAL_CAPSULE | Freq: Two times a day (BID) | ORAL | Status: AC

## 2013-12-15 MED ORDER — DENOSUMAB 120 MG/1.7ML ~~LOC~~ SOLN
120.0000 mg | Freq: Once | SUBCUTANEOUS | Status: AC
Start: 2013-12-15 — End: 2013-12-15
  Administered 2013-12-15: 120 mg via SUBCUTANEOUS
  Filled 2013-12-15: qty 1.7

## 2013-12-15 MED ORDER — PALONOSETRON HCL INJECTION 0.25 MG/5ML
0.2500 mg | Freq: Once | INTRAVENOUS | Status: AC
  Administered 2013-12-15: 0.25 mg via INTRAVENOUS

## 2013-12-15 MED ORDER — CISPLATIN CHEMO INJECTION 100MG/100ML
30.0000 mg/m2 | Freq: Once | INTRAVENOUS | Status: DC
  Filled 2013-12-15: qty 55

## 2013-12-15 MED ORDER — IRINOTECAN HCL CHEMO INJECTION 100 MG/5ML
65.0000 mg/m2 | Freq: Once | INTRAVENOUS | Status: DC
  Filled 2013-12-15: qty 5.9

## 2013-12-15 MED ORDER — OXYCODONE-ACETAMINOPHEN 7.5-325 MG PO TABS: 1.0000 | ORAL_TABLET | ORAL | Status: AC | PRN

## 2013-12-15 MED ORDER — SODIUM CHLORIDE 0.9 % IJ SOLN
10.0000 mL | INTRAMUSCULAR | Status: DC | PRN
  Filled 2013-12-15: qty 10

## 2013-12-15 MED ORDER — HYDROMORPHONE HCL PF 1 MG/ML IJ SOLN
1.0000 mg | Freq: Once | INTRAMUSCULAR | Status: AC
  Administered 2013-12-15: 1 mg via INTRAVENOUS
  Filled 2013-12-15: qty 1

## 2013-12-15 MED ORDER — SODIUM CHLORIDE 0.9 % IV SOLN
Freq: Once | INTRAVENOUS | Status: AC
  Administered 2013-12-15: 10:00:00 via INTRAVENOUS

## 2013-12-15 MED ORDER — SODIUM CHLORIDE 0.9 % IV SOLN
150.0000 mg | Freq: Once | INTRAVENOUS | Status: AC
  Administered 2013-12-15: 150 mg via INTRAVENOUS
  Filled 2013-12-15: qty 5

## 2013-12-15 MED ORDER — MORPHINE SULFATE 4 MG/ML IJ SOLN
INTRAMUSCULAR | Status: AC
  Filled 2013-12-15: qty 1

## 2013-12-15 MED ORDER — ONDANSETRON HCL 4 MG/2ML IJ SOLN
4.0000 mg | Freq: Once | INTRAMUSCULAR | Status: AC
  Administered 2013-12-15: 4 mg via INTRAVENOUS
  Filled 2013-12-15: qty 2

## 2013-12-15 MED ORDER — OXYCODONE HCL 7.5 MG PO TABS: 1.0000 | ORAL_TABLET | ORAL | Status: DC | PRN

## 2013-12-15 MED ORDER — DEXAMETHASONE SODIUM PHOSPHATE 20 MG/5ML IJ SOLN
INTRAMUSCULAR | Status: AC
  Filled 2013-12-15: qty 5

## 2013-12-15 MED ORDER — POTASSIUM CHLORIDE 2 MEQ/ML IV SOLN
Freq: Once | INTRAVENOUS | Status: AC
  Administered 2013-12-15: 10:00:00 via INTRAVENOUS
  Filled 2013-12-15: qty 10

## 2013-12-15 MED ORDER — DEXAMETHASONE SODIUM PHOSPHATE 20 MG/5ML IJ SOLN
12.0000 mg | Freq: Once | INTRAMUSCULAR | Status: AC
  Administered 2013-12-15: 12 mg via INTRAVENOUS

## 2013-12-15 MED ORDER — HEPARIN SOD (PORK) LOCK FLUSH 100 UNIT/ML IV SOLN
500.0000 [IU] | Freq: Once | INTRAVENOUS | Status: DC | PRN
  Filled 2013-12-15: qty 5

## 2013-12-15 MED ORDER — PALONOSETRON HCL INJECTION 0.25 MG/5ML
INTRAVENOUS | Status: AC
  Filled 2013-12-15: qty 5

## 2013-12-15 NOTE — Telephone Encounter (Signed)
Message copied by Cherylynn Ridges on Mon Dec 15, 2013  3:20 PM ------      Message from: Adalberto Cole      Created: Mon Dec 15, 2013  2:28 PM      Regarding: TX CANCELED 12/15/13       DUE TO UNCONTROLLED PAIN, PATIENT DID NOT RECEIVE IRINOTECAN OR CDDP, TAKEN TO ED ON 12/15/13. ALEXIS RN ------

## 2013-12-15 NOTE — ED Notes (Signed)
Bed: NG76 Expected date:  Expected time:  Means of arrival:  Comments: Hold- Pt from [place]

## 2013-12-15 NOTE — Progress Notes (Signed)
Per Dr. Julien Nordmann, okay to tx with hgb 8.6. Son had concerns regarding patient's back/leg pain. Per Dr. Julien Nordmann, radiation MD will manage, patient has radiation consult on Wed. Patient and son voice understanding of information.

## 2013-12-15 NOTE — ED Notes (Signed)
Initial contact-pt A&Ox4. Placed on 3.5 L O2 [place] which is patient's baseline O2 therapy at home. C/o severe back pain and generalized pain starting Friday. Was given Tramadol prescription and Percocet which were not successful. Per CA MD patient's lung CA has spread to bone. Pain became too unbearable during chemo treatment today and they had to stop treatment. This is her 5-6 treatment per son. No other complaints at this time. Patient appears to be in great deal of pain. Awaiting MD.

## 2013-12-15 NOTE — Patient Instructions (Signed)
[facility] Discharge Instructions for Patients Receiving Chemotherapy  Today you received the following chemotherapy agents: Cisplatin, Irinotecan   To help prevent nausea and vomiting after your treatment, we encourage you to take your nausea medication: Compazine 10 mg every hrs as needed.   If you develop nausea and vomiting that is not controlled by your nausea medication, call the clinic.   BELOW ARE SYMPTOMS THAT SHOULD BE REPORTED IMMEDIATELY:  *FEVER GREATER THAN 100.5 F  *CHILLS WITH OR WITHOUT FEVER  NAUSEA AND VOMITING THAT IS NOT CONTROLLED WITH YOUR NAUSEA MEDICATION  *UNUSUAL SHORTNESS OF BREATH  *UNUSUAL BRUISING OR BLEEDING  TENDERNESS IN MOUTH AND THROAT WITH OR WITHOUT PRESENCE OF ULCERS  *URINARY PROBLEMS  *BOWEL PROBLEMS  UNUSUAL RASH Items with * indicate a potential emergency and should be followed up as soon as possible.  Feel free to call the clinic you have any questions or concerns. The clinic phone number is [phone].  Cisplatin injection What is this medicine? CISPLATIN (SIS pla tin) is a chemotherapy drug. It targets fast dividing cells, like cancer cells, and causes these cells to die. This medicine is used to treat many types of cancer like bladder, ovarian, and testicular cancers. This medicine may be used for other purposes; ask your health care provider or pharmacist if you have questions. COMMON BRAND NAME(S): Platinol -AQ, Platinol What should I tell my health care provider before I take this medicine? They need to know if you have any of these conditions: -blood disorders -hearing problems -kidney disease -recent or ongoing radiation therapy -an unusual or allergic reaction to cisplatin, carboplatin, other chemotherapy, other medicines, foods, dyes, or preservatives -pregnant or trying to get pregnant -breast-feeding How should I use this medicine? This drug is given as an infusion into a vein. It is administered  in a hospital or clinic by a specially trained health care professional. Talk to your pediatrician regarding the use of this medicine in children. Special care may be needed. Overdosage: If you think you have taken too much of this medicine contact a poison control center or emergency room at once. NOTE: This medicine is only for you. Do not share this medicine with others. What if I miss a dose? It is important not to miss a dose. Call your doctor or health care professional if you are unable to keep an appointment. What may interact with this medicine? -dofetilide -foscarnet -medicines for seizures -medicines to increase blood counts like filgrastim, pegfilgrastim, sargramostim -probenecid -pyridoxine used with altretamine -rituximab -some antibiotics like amikacin, gentamicin, neomycin, polymyxin B, streptomycin, tobramycin -sulfinpyrazone -vaccines -zalcitabine Talk to your doctor or health care professional before taking any of these medicines: -acetaminophen -aspirin -ibuprofen -ketoprofen -naproxen This list may not describe all possible interactions. Give your health care provider a list of all the medicines, herbs, non-prescription drugs, or dietary supplements you use. Also tell them if you smoke, drink alcohol, or use illegal drugs. Some items may interact with your medicine. What should I watch for while using this medicine? Your condition will be monitored carefully while you are receiving this medicine. You will need important blood work done while you are taking this medicine. This drug may make you feel generally unwell. This is not uncommon, as chemotherapy can affect healthy cells as well as cancer cells. Report any side effects. Continue your course of treatment even though you feel ill unless your doctor tells you to stop. In some cases, you may be given additional medicines to  help with side effects. Follow all directions for their use. Call your doctor or health  care professional for advice if you get a fever, chills or sore throat, or other symptoms of a cold or flu. Do not treat yourself. This drug decreases your body's ability to fight infections. Try to avoid being around people who are sick. This medicine may increase your risk to bruise or bleed. Call your doctor or health care professional if you notice any unusual bleeding. Be careful brushing and flossing your teeth or using a toothpick because you may get an infection or bleed more easily. If you have any dental work done, tell your dentist you are receiving this medicine. Avoid taking products that contain aspirin, acetaminophen, ibuprofen, naproxen, or ketoprofen unless instructed by your doctor. These medicines may hide a fever. Do not become pregnant while taking this medicine. Women should inform their doctor if they wish to become pregnant or think they might be pregnant. There is a potential for serious side effects to an unborn child. Talk to your health care professional or pharmacist for more information. Do not breast-feed an infant while taking this medicine. Drink fluids as directed while you are taking this medicine. This will help protect your kidneys. Call your doctor or health care professional if you get diarrhea. Do not treat yourself. What side effects may I notice from receiving this medicine? Side effects that you should report to your doctor or health care professional as soon as possible: -allergic reactions like skin rash, itching or hives, swelling of the face, lips, or tongue -signs of infection - fever or chills, cough, sore throat, pain or difficulty passing urine -signs of decreased platelets or bleeding - bruising, pinpoint red spots on the skin, black, tarry stools, nosebleeds -signs of decreased red blood cells - unusually weak or tired, fainting spells, lightheadedness -breathing problems -changes in hearing -gout pain -low blood counts - This drug may decrease the  number of white blood cells, red blood cells and platelets. You may be at increased risk for infections and bleeding. -nausea and vomiting -pain, swelling, redness or irritation at the injection site -pain, tingling, numbness in the hands or feet -problems with balance, movement -trouble passing urine or change in the amount of urine Side effects that usually do not require medical attention (report to your doctor or health care professional if they continue or are bothersome): -changes in vision -loss of appetite -metallic taste in the mouth or changes in taste This list may not describe all possible side effects. Call your doctor for medical advice about side effects. You may report side effects to FDA at 1-800-FDA-1088. Where should I keep my medicine? This drug is given in a hospital or clinic and will not be stored at home. NOTE: This sheet is a summary. It may not cover all possible information. If you have questions about this medicine, talk to your doctor, pharmacist, or health care provider.  2014, [employer] Standard. (2007-10-01 14:40:54)  Irinotecan injection What is this medicine? IRINOTECAN (ir in oh TEE kan ) is a chemotherapy drug. It is used to treat colon and rectal cancer. This medicine may be used for other purposes; ask your health care provider or pharmacist if you have questions. COMMON BRAND NAME(S): Camptosar What should I tell my health care provider before I take this medicine? They need to know if you have any of these conditions: -blood disorders -dehydration -diarrhea -infection (especially a virus infection such as chickenpox, cold sores, or herpes) -  liver disease -low blood counts, like low white cell, platelet, or red cell counts -recent or ongoing radiation therapy -an unusual or allergic reaction to irinotecan, sorbitol, other chemotherapy, other medicines, foods, dyes, or preservatives -pregnant or trying to get pregnant -breast-feeding How  should I use this medicine? This drug is given as an infusion into a vein. It is administered in a hospital or clinic by a specially trained health care professional. Talk to your pediatrician regarding the use of this medicine in children. Special care may be needed. Overdosage: If you think you have taken too much of this medicine contact a poison control center or emergency room at once. NOTE: This medicine is only for you. Do not share this medicine with others. What if I miss a dose? It is important not to miss your dose. Call your doctor or health care professional if you are unable to keep an appointment. What may interact with this medicine? Do not take this medicine with any of the following medications: -atazanavir -ketoconazole -St. John's Wort This medicine may also interact with the following medications: -dexamethasone -diuretics -laxatives -medicines for seizures like carbamazepine, mephobarbital, phenobarbital, phenytoin, primidone -medicines to increase blood counts like filgrastim, pegfilgrastim, sargramostim -prochlorperazine -vaccines This list may not describe all possible interactions. Give your health care provider a list of all the medicines, herbs, non-prescription drugs, or dietary supplements you use. Also tell them if you smoke, drink alcohol, or use illegal drugs. Some items may interact with your medicine. What should I watch for while using this medicine? Your condition will be monitored carefully while you are receiving this medicine. You will need important blood work done while you are taking this medicine. This drug may make you feel generally unwell. This is not uncommon, as chemotherapy can affect healthy cells as well as cancer cells. Report any side effects. Continue your course of treatment even though you feel ill unless your doctor tells you to stop. In some cases, you may be given additional medicines to help with side effects. Follow all directions  for their use. You may get drowsy or dizzy. Do not drive, use machinery, or do anything that needs mental alertness until you know how this medicine affects you. Do not stand or sit up quickly, especially if you are an older patient. This reduces the risk of dizzy or fainting spells. Call your doctor or health care professional for advice if you get a fever, chills or sore throat, or other symptoms of a cold or flu. Do not treat yourself. This drug decreases your body's ability to fight infections. Try to avoid being around people who are sick. This medicine may increase your risk to bruise or bleed. Call your doctor or health care professional if you notice any unusual bleeding. Be careful brushing and flossing your teeth or using a toothpick because you may get an infection or bleed more easily. If you have any dental work done, tell your dentist you are receiving this medicine. Avoid taking products that contain aspirin, acetaminophen, ibuprofen, naproxen, or ketoprofen unless instructed by your doctor. These medicines may hide a fever. Do not become pregnant while taking this medicine. Women should inform their doctor if they wish to become pregnant or think they might be pregnant. There is a potential for serious side effects to an unborn child. Talk to your health care professional or pharmacist for more information. Do not breast-feed an infant while taking this medicine. What side effects may I notice from receiving this medicine?  Side effects that you should report to your doctor or health care professional as soon as possible: -allergic reactions like skin rash, itching or hives, swelling of the face, lips, or tongue -low blood counts - this medicine may decrease the number of white blood cells, red blood cells and platelets. You may be at increased risk for infections and bleeding. -signs of infection - fever or chills, cough, sore throat, pain or difficulty passing urine -signs of decreased  platelets or bleeding - bruising, pinpoint red spots on the skin, black, tarry stools, blood in the urine -signs of decreased red blood cells - unusually weak or tired, fainting spells, lightheadedness -breathing problems -chest pain -diarrhea -feeling faint or lightheaded, falls -flushing, runny nose, sweating during infusion -mouth sores or pain -pain, swelling, redness or irritation where injected -pain, swelling, warmth in the leg -pain, tingling, numbness in the hands or feet -problems with balance, talking, walking -stomach cramps, pain -trouble passing urine or change in the amount of urine -vomiting as to be unable to hold down drinks or food -yellowing of the eyes or skin Side effects that usually do not require medical attention (report to your doctor or health care professional if they continue or are bothersome): -constipation -hair loss -headache -loss of appetite -nausea, vomiting -stomach upset This list may not describe all possible side effects. Call your doctor for medical advice about side effects. You may report side effects to FDA at 1-800-FDA-1088. Where should I keep my medicine? This drug is given in a hospital or clinic and will not be stored at home. NOTE: This sheet is a summary. It may not cover all possible information. If you have questions about this medicine, talk to your doctor, pharmacist, or health care provider.  2014, [employer] Standard. (2007-11-12 16:29:12)  Denosumab injection What is this medicine? DENOSUMAB (den oh sue mab) slows bone breakdown. Prolia is used to treat osteoporosis in women after menopause and in men. Delton See is used to prevent bone fractures and other bone problems caused by cancer bone metastases. Delton See is also used to treat giant cell tumor of the bone. This medicine may be used for other purposes; ask your health care provider or pharmacist if you have questions. COMMON BRAND NAME(S): Prolia, XGEVA What should I tell  my health care provider before I take this medicine? They need to know if you have any of these conditions: -dental disease -eczema -infection or history of infections -kidney disease or on dialysis -low blood calcium or vitamin D -malabsorption syndrome -scheduled to have surgery or tooth extraction -taking medicine that contains denosumab -thyroid or parathyroid disease -an unusual reaction to denosumab, other medicines, foods, dyes, or preservatives -pregnant or trying to get pregnant -breast-feeding How should I use this medicine? This medicine is for injection under the skin. It is given by a health care professional in a hospital or clinic setting. If you are getting Prolia, a special MedGuide will be given to you by the pharmacist with each prescription and refill. Be sure to read this information carefully each time. For Prolia, talk to your pediatrician regarding the use of this medicine in children. Special care may be needed. For Delton See, talk to your pediatrician regarding the use of this medicine in children. While this drug may be prescribed for children as young as 13 years for selected conditions, precautions do apply. Overdosage: If you think you've taken too much of this medicine contact a poison control center or emergency room at once. Overdosage: If you  think you have taken too much of this medicine contact a poison control center or emergency room at once. NOTE: This medicine is only for you. Do not share this medicine with others. What if I miss a dose? It is important not to miss your dose. Call your doctor or health care professional if you are unable to keep an appointment. What may interact with this medicine? Do not take this medicine with any of the following medications: -other medicines containing denosumab This medicine may also interact with the following medications: -medicines that suppress the immune system -medicines that treat cancer -steroid medicines  like prednisone or cortisone This list may not describe all possible interactions. Give your health care provider a list of all the medicines, herbs, non-prescription drugs, or dietary supplements you use. Also tell them if you smoke, drink alcohol, or use illegal drugs. Some items may interact with your medicine. What should I watch for while using this medicine? Visit your doctor or health care professional for regular checks on your progress. Your doctor or health care professional may order blood tests and other tests to see how you are doing. Call your doctor or health care professional if you get a cold or other infection while receiving this medicine. Do not treat yourself. This medicine may decrease your body's ability to fight infection. You should make sure you get enough calcium and vitamin D while you are taking this medicine, unless your doctor tells you not to. Discuss the foods you eat and the vitamins you take with your health care professional. See your dentist regularly. Brush and floss your teeth as directed. Before you have any dental work done, tell your dentist you are receiving this medicine. Do not become pregnant while taking this medicine or for 5 months after stopping it. Women should inform their doctor if they wish to become pregnant or think they might be pregnant. There is a potential for serious side effects to an unborn child. Talk to your health care professional or pharmacist for more information. What side effects may I notice from receiving this medicine? Side effects that you should report to your doctor or health care professional as soon as possible: -allergic reactions like skin rash, itching or hives, swelling of the face, lips, or tongue -breathing problems -chest pain -fast, irregular heartbeat -feeling faint or lightheaded, falls -fever, chills, or any other sign of infection -muscle spasms, tightening, or twitches -numbness or tingling -skin blisters or  bumps, or is dry, peels, or red -slow healing or unexplained pain in the mouth or jaw -unusual bleeding or bruising Side effects that usually do not require medical attention (Report these to your doctor or health care professional if they continue or are bothersome.): -muscle pain -stomach upset, gas This list may not describe all possible side effects. Call your doctor for medical advice about side effects. You may report side effects to FDA at 1-800-FDA-1088. Where should I keep my medicine? This medicine is only given in a clinic, doctor's office, or other health care setting and will not be stored at home. NOTE: This sheet is a summary. It may not cover all possible information. If you have questions about this medicine, talk to your doctor, pharmacist, or health care provider.  2014, [employer] Standard. (2011-12-25 12:37:47)

## 2013-12-15 NOTE — ED Provider Notes (Signed)
CSN: [id number]     Arrival date & time 12/15/13  1424 History   First MD Initiated Contact with Patient 12/15/13 1525     Chief Complaint  Patient presents with  . Back Pain  . Leg Pain     (Consider location/radiation/quality/duration/timing/severity/associated sxs/prior Treatment) HPI  57yF with body pain. Primarily back, hips, thighs. Hx of metastatic cancer. Recent imaging with "innumerable sclerotic osseous metastasis." Has been taking tramadol initially, then percocet 5/325 1x every four hours and then increased to 2x every 4 hours. Pain has not been adequately controlled with this. Unable to tolerate tx in cancer center because of the pain she is in. Denies trauma. No fever or chills.    Past Medical History  Diagnosis Date  . COPD (chronic obstructive pulmonary disease)   . Pulmonary fibrosis     diagnosed with this 2001.    . Diabetes mellitus without complication   . Hiatal hernia   . Depression   . Anxiety   . High cholesterol   . Hypertension   . Osteoporosis   . OSA on CPAP     13cm  . Overactive bladder   . Heart murmur   . Cancer     Lung   Past Surgical History  Procedure Laterality Date  . Bladder suspension    . Abdominal hysterectomy      severe bleeding-when she was in her 30's  . Lung biopsy     Family History  Problem Relation Age of Onset  . Pulmonary fibrosis Brother    History  Substance Use Topics  . Smoking status: Current Every Day Smoker -- 0.75 packs/day for 42 years    Types: Cigarettes    Start date: 07/11/1963  . Smokeless tobacco: Never Used  . Alcohol Use: No   OB History   Grav Para Term Preterm Abortions TAB SAB Ect Mult Living                 Review of Systems  All systems reviewed and negative, other than as noted in HPI.   Allergies  Review of patient's allergies indicates no known allergies.  Home Medications   Prior to Admission medications   Medication Sig Start Date End Date Taking? Authorizing Provider   albuterol (PROVENTIL) (2.5 MG/3ML) 0.083% nebulizer solution Take 3 mLs (2.5 mg total) by nebulization every 2 (two) hours as needed for wheezing or shortness of breath. 08/11/13  Yes Shanker Kristeen Mans, MD  azaTHIOprine (IMURAN) 50 MG tablet Take 50 mg by mouth 2 (two) times daily.   Yes Historical Provider, MD  calcium citrate-vitamin D (CITRACAL+D) 315-200 MG-UNIT per tablet Take 1 tablet by mouth daily.   Yes Historical Provider, MD  Cimetidine (TAGAMET PO) Take 1 tablet by mouth daily.   Yes Historical Provider, MD  cyanocobalamin 1000 MCG tablet Take 100 mcg by mouth daily.   Yes Historical Provider, MD  esomeprazole (NEXIUM) 40 MG capsule Take 40 mg by mouth daily before breakfast.   Yes Historical Provider, MD  Fluticasone-Salmeterol (ADVAIR DISKUS) 250-50 MCG/DOSE AEPB Inhale 1 puff into the lungs 2 (two) times daily. 10/07/13  Yes Collene Gobble, MD  folic acid (FOLVITE) 1 MG tablet Take 1 mg by mouth 2 (two) times daily.   Yes Historical Provider, MD  guaiFENesin (MUCINEX) 600 MG 12 hr tablet Take 600 mg by mouth 2 (two) times daily.   Yes Historical Provider, MD  Insulin Detemir (LEVEMIR FLEXPEN [place]) Inject 20 Units into the skin daily.  Yes Historical Provider, MD  IRON PO Take 1 tablet by mouth 4 (four) times daily.   Yes Historical Provider, MD  LORazepam (ATIVAN) 1 MG tablet Take 1 mg by mouth at bedtime.   Yes Historical Provider, MD  metFORMIN (GLUCOPHAGE) 500 MG tablet Take 1,000 mg by mouth 2 (two) times daily with a meal.    Yes Historical Provider, MD  Multiple Vitamin (MULTIVITAMIN WITH MINERALS) TABS tablet Take 1 tablet by mouth daily. 08/11/13  Yes Shanker Kristeen Mans, MD  niacin 500 MG tablet Take 500 mg by mouth daily.   Yes Historical Provider, MD  Omega-3 Fatty Acids (FISH OIL) 300 MG CAPS Take 1 capsule by mouth daily.   Yes Historical Provider, MD  oxybutynin (DITROPAN-XL) 10 MG 24 hr tablet Take 10 mg by mouth daily.   Yes Historical Provider, MD  oxyCODONE-acetaminophen  (PERCOCET/ROXICET) 5-325 MG per tablet Take 1 tablet by mouth every 4 (four) hours as needed for severe pain (take 1 tablet po every 4 hours prn pain). 12/12/13  Yes Adrena E Johnson, PA-C  PARoxetine (PAXIL) 30 MG tablet Take 30 mg by mouth daily.   Yes Historical Provider, MD  predniSONE (DELTASONE) 10 MG tablet Take 10 mg by mouth daily with breakfast.  08/29/13  Yes Historical Provider, MD  prochlorperazine (COMPAZINE) 10 MG tablet Take 1 tablet (10 mg total) by mouth every 6 (six) hours as needed for nausea or vomiting. 11/18/13  Yes Curt Bears, MD  rosuvastatin (CRESTOR) 10 MG tablet Take 10 mg by mouth daily.   Yes Historical Provider, MD  SPIRIVA HANDIHALER 18 MCG inhalation capsule Place 18 mcg into inhaler and inhale daily.  08/26/13  Yes Historical Provider, MD  traMADol (ULTRAM) 50 MG tablet 1-2 every 4 hours as needed for cough or pain 11/03/13  Yes Tanda Rockers, MD  vitamin E 400 UNIT capsule Take 400 Units by mouth daily.   Yes Historical Provider, MD   BP 130/57  Pulse 97  Temp(Src) 98.1 F (36.7 C) (Oral)  Resp 14  SpO2 92% Physical Exam  Nursing note and vitals reviewed. Constitutional: She appears well-developed and well-nourished. No distress.  Sitting on edge of bed. Seems very uncomfortable.   HENT:  Head: Normocephalic and atraumatic.  Eyes: Conjunctivae are normal. Right eye exhibits no discharge. Left eye exhibits no discharge.  Neck: Neck supple.  Cardiovascular: Normal rate, regular rhythm and normal heart sounds.  Exam reveals no gallop and no friction rub.   No murmur heard. Pulmonary/Chest:  Splinting. Diffuse rhonchi  Abdominal: Soft. She exhibits no distension. There is tenderness.  Mild upper abdominal tenderness w/o rebound or guarding  Musculoskeletal: She exhibits no edema and no tenderness.  Neurological: She is alert.  Skin: Skin is warm and dry.  Psychiatric: She has a normal mood and affect. Her behavior is normal. Thought content normal.     ED Course  Procedures (including critical care time) Labs Review Labs Reviewed - No data to display  Imaging Review No results found.   EKG Interpretation None      MDM   Final diagnoses:  Cancer related pain    57yF with metastatic cancer and poorly controlled pain. Will treat symptoms. Assuming can get pain under control in ED, will adjust home medication for higher doses of opiates and start a bowel regimen.   Pt appears much more comfortable. Sitting in bed eating dorritos. Tolerated hydromorphone 1mg  IV x2. BP remains fine and doesn't seem overly sedated. Pt has been  taking oxycodone 5-10 mg every 4 hours w/o adequate pain control. Will increase to 7.5-15 mg every 4 hours PRN as well as start her on low dose of MS contin. Colace, dietary bulk, and stay well hydrated. Return precautions were discussed. Outpt FU with either PCP or oncologist for further pain management.     Virgel Manifold, MD 12/15/13 [phone]

## 2013-12-15 NOTE — Progress Notes (Signed)
1325- Patient tearful, reports pain 10/10 in lower back and legs. Dr. Julien Nordmann made aware and Morphine 2 mg ordered and given. 1343- Patient still tearful, still reports pain 10/10.

## 2013-12-15 NOTE — Telephone Encounter (Signed)
No call made.  Patient did not receive treatment.

## 2013-12-15 NOTE — Progress Notes (Signed)
78- Dr. Julien Nordmann saw patient, advised patient and son ED was more appropriate at this time for pain mgmt. IV saline locked, report called to Ermalinda Barrios, Agricultural consultant in ED. Patient transported with 3L O2 via wheelchair. Tech and son assisted patient to ED. Per pharmacy, okay to give Delton See although tx not given today.

## 2013-12-15 NOTE — Progress Notes (Signed)
1245- Patient has only voided 150 cc and has approx. 300-350 mL of hydration fluids left. Dr. Julien Nordmann contacted and made aware. No new orders for additional fluids at this time. Will continue to monitor patient.

## 2013-12-15 NOTE — ED Notes (Addendum)
Pt c/o lower back and BLE pain x 3 days.  Pain score 10/10.  Pt was at [facility] today for chemo, but treatment stopped due to pain.  Hx of lung CA, which has spread to bone per Pt's son.  Pt took [place] around 1100 and given 2mg  Morphine at 1325 w/o relief.

## 2013-12-16 ENCOUNTER — Telehealth: Payer: Self-pay | Admitting: *Deleted

## 2013-12-16 ENCOUNTER — Encounter: Payer: Self-pay | Admitting: Radiation Oncology

## 2013-12-16 NOTE — Progress Notes (Signed)
Thoracic Location of Tumor / Histology: LUNG Right Suprclavicular   DX February 2015 progression in chest in addition to her liver  Patient presented  months ago with symptoms of: sob,pain  Biopsies of (if applicable) revealed: Diagnosis 08/18/13: Lymph node, needle/core biopsy, Right supraclavicular- HIGH GRADE POORLY DIFFERENTIATED NEUROENDOCRINE CARCINOMA, SMALL CELL TYPE,  Tobacco/Marijuana/Snuff/ETOH use: smoker cigarettes 542 years 1.5ppd now .75ppd, ,no smokeless tobacco,,no alcohol or illicit dug use   Past/Anticipated interventions by cardiothoracic surgery, if any:   Past/Anticipated interventions by medical oncology, if any: Dr.Mohamed last appt seen 12/15/13 sent to the ED for pain mgt, not admitted , systemic chemotherapy with Carboplatin on day 1 & etoposide on days 1,2,^&3 with Neulasta support on day 4,s/p 4 cycles  Dated 12/10/13, to start [facility] for bone pain, , radiation referral consideration palliative radiation , MRI Brain  Scheduled 12/19/13 @7pm   Signs/Symptoms  Weight changes, if any: No  Respiratory complaints, if any: SOB, home  Oxygen /COPD, Pulmonary Fibrosis dx 2001  Hemoptysis, if any: No   Pain issues, if any:  Generalized back,hip,thigh pain 10/10 in the ED 12/15/13, increased  Her Oxycodone to 7.5-15 mg q 4h prn, and low dose MS Contin, Colace  Dr. Ivery Quale, , patient is drowsy from new pain meds, 5/10 pain scale today SAFETY ISSUES:  Prior radiation? No  Pacemaker/ICD? No  Possible current pregnancy? No  Hx hysterectomy   Is the patient on methotrexate? No  Current Complaints / other details: 1 son, Divorced,    Extensive stage small cell lung cancer dx in February 2015;OSA on CPAP Heart murmur,  On  Home Oxygen continues to smoke, decreased to 14 daily stated, father deceased lung ca, smoker,

## 2013-12-16 NOTE — Telephone Encounter (Signed)
Called Jessica Rollins for chemotherapy F/U.  Did not receive full treatment as planned.  Xgeva injection given.  Reports she is doing as well as can be expeted.  Denies n/v.  Denies any new side effects or symptoms.  Bowel and bladder is functioning well.  Eating small amounts and drinking well.  Instructed to drink 64 oz minimum daily and try to eat small meals or snacks.   Denies questions at this time and encouraged to call if needed.  Reviewed how to call after hours in the case of an emergency.

## 2013-12-16 NOTE — Telephone Encounter (Signed)
Message copied by Cherylynn Ridges on Tue Dec 16, 2013  2:56 PM ------      Message from: Adalberto Cole      Created: Mon Dec 15, 2013 10:00 AM      Regarding: new tx      Contact: [phone]       New tx change: irinotecan, cisplatin ------

## 2013-12-17 ENCOUNTER — Ambulatory Visit
Admission: RE | Admit: 2013-12-17 | Discharge: 2013-12-17 | Disposition: A | Discharge status: Home / Self Care | Payer: Medicare Other | Source: Ambulatory Visit | Attending: Radiation Oncology | Admitting: Radiation Oncology

## 2013-12-17 ENCOUNTER — Encounter: Payer: Self-pay | Admitting: Radiation Oncology

## 2013-12-17 VITALS — BP 123/41 | HR 103 | Temp 98.3°F | Resp 22 | Ht 63.0 in | Wt 168.3 lb

## 2013-12-17 DIAGNOSIS — C7951 Secondary malignant neoplasm of bone: Secondary | ICD-10-CM

## 2013-12-17 DIAGNOSIS — M549 Dorsalgia, unspecified: Secondary | ICD-10-CM | POA: Insufficient documentation

## 2013-12-17 DIAGNOSIS — C7952 Secondary malignant neoplasm of bone marrow: Secondary | ICD-10-CM

## 2013-12-17 DIAGNOSIS — F17200 Nicotine dependence, unspecified, uncomplicated: Secondary | ICD-10-CM | POA: Insufficient documentation

## 2013-12-17 DIAGNOSIS — R0602 Shortness of breath: Secondary | ICD-10-CM | POA: Diagnosis not present

## 2013-12-17 DIAGNOSIS — J449 Chronic obstructive pulmonary disease, unspecified: Secondary | ICD-10-CM

## 2013-12-17 DIAGNOSIS — G4733 Obstructive sleep apnea (adult) (pediatric): Secondary | ICD-10-CM | POA: Insufficient documentation

## 2013-12-17 DIAGNOSIS — J9610 Chronic respiratory failure, unspecified whether with hypoxia or hypercapnia: Secondary | ICD-10-CM

## 2013-12-17 DIAGNOSIS — E119 Type 2 diabetes mellitus without complications: Secondary | ICD-10-CM | POA: Insufficient documentation

## 2013-12-17 DIAGNOSIS — C761 Malignant neoplasm of thorax: Secondary | ICD-10-CM | POA: Insufficient documentation

## 2013-12-17 DIAGNOSIS — J189 Pneumonia, unspecified organism: Secondary | ICD-10-CM | POA: Diagnosis not present

## 2013-12-17 DIAGNOSIS — C3490 Malignant neoplasm of unspecified part of unspecified bronchus or lung: Secondary | ICD-10-CM

## 2013-12-17 DIAGNOSIS — R1310 Dysphagia, unspecified: Secondary | ICD-10-CM

## 2013-12-17 DIAGNOSIS — M810 Age-related osteoporosis without current pathological fracture: Secondary | ICD-10-CM | POA: Insufficient documentation

## 2013-12-17 DIAGNOSIS — Z90710 Acquired absence of both cervix and uterus: Secondary | ICD-10-CM

## 2013-12-17 DIAGNOSIS — R599 Enlarged lymph nodes, unspecified: Secondary | ICD-10-CM | POA: Insufficient documentation

## 2013-12-17 DIAGNOSIS — I10 Essential (primary) hypertension: Secondary | ICD-10-CM | POA: Insufficient documentation

## 2013-12-17 DIAGNOSIS — C760 Malignant neoplasm of head, face and neck: Secondary | ICD-10-CM

## 2013-12-17 DIAGNOSIS — J4489 Other specified chronic obstructive pulmonary disease: Secondary | ICD-10-CM | POA: Insufficient documentation

## 2013-12-17 HISTORY — DX: Malignant neoplasm of unspecified part of unspecified bronchus or lung: C34.90

## 2013-12-17 NOTE — Progress Notes (Signed)
Please see the Nurse Progress Note in the MD Initial Consult Encounter for this patient. 

## 2013-12-18 ENCOUNTER — Telehealth: Payer: Self-pay | Admitting: *Deleted

## 2013-12-18 DIAGNOSIS — C3490 Malignant neoplasm of unspecified part of unspecified bronchus or lung: Secondary | ICD-10-CM

## 2013-12-18 MED ORDER — FUROSEMIDE 20 MG PO TABS: 20.0000 mg | ORAL_TABLET | Freq: Every day | ORAL | Status: DC | PRN

## 2013-12-18 MED ORDER — METHYLPREDNISOLONE (PAK) 4 MG PO TABS: ORAL_TABLET | ORAL | Status: DC

## 2013-12-18 NOTE — Telephone Encounter (Signed)
Pt's son called stating that pt is very tired all the time with her new pain medication, but per pain is better controlled.  She is having increased LE swelling and her appetite is very low.  Per dr Vista Mink okay to call in lasix 20mg  daily prn for LE swelling and medrol dose pack.  Dr Vista Mink is aware pt already taking prednisone daily for pulmonary fibrosis.  Pt's son verbalized understanding.  SLJ

## 2013-12-19 ENCOUNTER — Ambulatory Visit
Admission: RE | Admit: 2013-12-19 | Discharge: 2013-12-19 | Disposition: A | Discharge status: Home / Self Care | Payer: Medicare Other | Source: Ambulatory Visit | Attending: Radiation Oncology | Admitting: Radiation Oncology

## 2013-12-19 ENCOUNTER — Ambulatory Visit (HOSPITAL_COMMUNITY)
Admission: RE | Admit: 2013-12-19 | Discharge: 2013-12-19 | Disposition: A | Discharge status: Home / Self Care | Payer: Medicare Other | Source: Ambulatory Visit | Attending: Internal Medicine | Admitting: Internal Medicine

## 2013-12-19 DIAGNOSIS — C7951 Secondary malignant neoplasm of bone: Secondary | ICD-10-CM

## 2013-12-19 DIAGNOSIS — C3490 Malignant neoplasm of unspecified part of unspecified bronchus or lung: Secondary | ICD-10-CM

## 2013-12-19 DIAGNOSIS — C7952 Secondary malignant neoplasm of bone marrow: Principal | ICD-10-CM

## 2013-12-19 MED ORDER — GADOBENATE DIMEGLUMINE 529 MG/ML IV SOLN
15.0000 mL | Freq: Once | INTRAVENOUS | Status: AC | PRN
  Administered 2013-12-19: 15 mL via INTRAVENOUS

## 2013-12-20 ENCOUNTER — Emergency Department (HOSPITAL_COMMUNITY): Payer: Medicare Other

## 2013-12-20 ENCOUNTER — Inpatient Hospital Stay (HOSPITAL_COMMUNITY)
Admission: EM | Admit: 2013-12-20 | Discharge: 2014-01-07 | DRG: 193 | Disposition: E | Payer: Medicare Other | Attending: Internal Medicine | Admitting: Internal Medicine

## 2013-12-20 ENCOUNTER — Encounter (HOSPITAL_COMMUNITY): Payer: Self-pay | Admitting: Emergency Medicine

## 2013-12-20 DIAGNOSIS — I10 Essential (primary) hypertension: Secondary | ICD-10-CM | POA: Diagnosis present

## 2013-12-20 DIAGNOSIS — Z66 Do not resuscitate: Secondary | ICD-10-CM | POA: Diagnosis not present

## 2013-12-20 DIAGNOSIS — IMO0002 Reserved for concepts with insufficient information to code with codable children: Secondary | ICD-10-CM

## 2013-12-20 DIAGNOSIS — M810 Age-related osteoporosis without current pathological fracture: Secondary | ICD-10-CM | POA: Diagnosis present

## 2013-12-20 DIAGNOSIS — C7951 Secondary malignant neoplasm of bone: Secondary | ICD-10-CM | POA: Diagnosis present

## 2013-12-20 DIAGNOSIS — R609 Edema, unspecified: Secondary | ICD-10-CM | POA: Diagnosis present

## 2013-12-20 DIAGNOSIS — J9600 Acute respiratory failure, unspecified whether with hypoxia or hypercapnia: Secondary | ICD-10-CM

## 2013-12-20 DIAGNOSIS — D630 Anemia in neoplastic disease: Secondary | ICD-10-CM | POA: Diagnosis present

## 2013-12-20 DIAGNOSIS — I509 Heart failure, unspecified: Secondary | ICD-10-CM | POA: Diagnosis present

## 2013-12-20 DIAGNOSIS — N318 Other neuromuscular dysfunction of bladder: Secondary | ICD-10-CM | POA: Diagnosis present

## 2013-12-20 DIAGNOSIS — C3490 Malignant neoplasm of unspecified part of unspecified bronchus or lung: Secondary | ICD-10-CM | POA: Diagnosis present

## 2013-12-20 DIAGNOSIS — J9620 Acute and chronic respiratory failure, unspecified whether with hypoxia or hypercapnia: Secondary | ICD-10-CM | POA: Diagnosis present

## 2013-12-20 DIAGNOSIS — R011 Cardiac murmur, unspecified: Secondary | ICD-10-CM | POA: Diagnosis present

## 2013-12-20 DIAGNOSIS — I5033 Acute on chronic diastolic (congestive) heart failure: Secondary | ICD-10-CM | POA: Diagnosis present

## 2013-12-20 DIAGNOSIS — Z515 Encounter for palliative care: Secondary | ICD-10-CM | POA: Diagnosis not present

## 2013-12-20 DIAGNOSIS — D696 Thrombocytopenia, unspecified: Secondary | ICD-10-CM | POA: Diagnosis present

## 2013-12-20 DIAGNOSIS — J189 Pneumonia, unspecified organism: Secondary | ICD-10-CM | POA: Diagnosis present

## 2013-12-20 DIAGNOSIS — E119 Type 2 diabetes mellitus without complications: Secondary | ICD-10-CM | POA: Diagnosis present

## 2013-12-20 DIAGNOSIS — F32A Depression, unspecified: Secondary | ICD-10-CM | POA: Diagnosis present

## 2013-12-20 DIAGNOSIS — R4182 Altered mental status, unspecified: Secondary | ICD-10-CM | POA: Diagnosis present

## 2013-12-20 DIAGNOSIS — F419 Anxiety disorder, unspecified: Secondary | ICD-10-CM | POA: Diagnosis present

## 2013-12-20 DIAGNOSIS — Z836 Family history of other diseases of the respiratory system: Secondary | ICD-10-CM

## 2013-12-20 DIAGNOSIS — J441 Chronic obstructive pulmonary disease with (acute) exacerbation: Secondary | ICD-10-CM | POA: Diagnosis present

## 2013-12-20 DIAGNOSIS — C7952 Secondary malignant neoplasm of bone marrow: Secondary | ICD-10-CM

## 2013-12-20 DIAGNOSIS — J8410 Pulmonary fibrosis, unspecified: Secondary | ICD-10-CM | POA: Diagnosis present

## 2013-12-20 DIAGNOSIS — E7800 Pure hypercholesterolemia, unspecified: Secondary | ICD-10-CM | POA: Diagnosis present

## 2013-12-20 DIAGNOSIS — Z9989 Dependence on other enabling machines and devices: Secondary | ICD-10-CM

## 2013-12-20 DIAGNOSIS — G9340 Encephalopathy, unspecified: Secondary | ICD-10-CM | POA: Diagnosis present

## 2013-12-20 DIAGNOSIS — R599 Enlarged lymph nodes, unspecified: Secondary | ICD-10-CM | POA: Diagnosis present

## 2013-12-20 DIAGNOSIS — D500 Iron deficiency anemia secondary to blood loss (chronic): Secondary | ICD-10-CM | POA: Diagnosis present

## 2013-12-20 DIAGNOSIS — J9610 Chronic respiratory failure, unspecified whether with hypoxia or hypercapnia: Secondary | ICD-10-CM

## 2013-12-20 DIAGNOSIS — G4733 Obstructive sleep apnea (adult) (pediatric): Secondary | ICD-10-CM | POA: Diagnosis present

## 2013-12-20 DIAGNOSIS — J449 Chronic obstructive pulmonary disease, unspecified: Secondary | ICD-10-CM | POA: Diagnosis present

## 2013-12-20 DIAGNOSIS — D649 Anemia, unspecified: Secondary | ICD-10-CM

## 2013-12-20 DIAGNOSIS — F3289 Other specified depressive episodes: Secondary | ICD-10-CM | POA: Diagnosis present

## 2013-12-20 DIAGNOSIS — J9601 Acute respiratory failure with hypoxia: Secondary | ICD-10-CM | POA: Diagnosis present

## 2013-12-20 DIAGNOSIS — N3281 Overactive bladder: Secondary | ICD-10-CM | POA: Diagnosis present

## 2013-12-20 DIAGNOSIS — F411 Generalized anxiety disorder: Secondary | ICD-10-CM | POA: Diagnosis present

## 2013-12-20 DIAGNOSIS — I498 Other specified cardiac arrhythmias: Secondary | ICD-10-CM | POA: Diagnosis present

## 2013-12-20 DIAGNOSIS — Z79899 Other long term (current) drug therapy: Secondary | ICD-10-CM | POA: Diagnosis not present

## 2013-12-20 DIAGNOSIS — R0602 Shortness of breath: Secondary | ICD-10-CM | POA: Diagnosis present

## 2013-12-20 DIAGNOSIS — F329 Major depressive disorder, single episode, unspecified: Secondary | ICD-10-CM | POA: Diagnosis present

## 2013-12-20 DIAGNOSIS — F17200 Nicotine dependence, unspecified, uncomplicated: Secondary | ICD-10-CM | POA: Diagnosis present

## 2013-12-20 DIAGNOSIS — D62 Acute posthemorrhagic anemia: Secondary | ICD-10-CM | POA: Diagnosis present

## 2013-12-20 DIAGNOSIS — D638 Anemia in other chronic diseases classified elsewhere: Secondary | ICD-10-CM | POA: Diagnosis present

## 2013-12-20 LAB — STREP PNEUMONIAE URINARY ANTIGEN: Strep Pneumo Urinary Antigen: POSITIVE — AB

## 2013-12-20 LAB — BLOOD GAS, ARTERIAL
Acid-Base Excess: 0.7 mmol/L (ref 0.0–2.0)
Acid-Base Excess: 6.8 mmol/L — ABNORMAL HIGH (ref 0.0–2.0)
Bicarbonate: 24.3 mEq/L — ABNORMAL HIGH (ref 20.0–24.0)
Bicarbonate: 30.8 mEq/L — ABNORMAL HIGH (ref 20.0–24.0)
DRAWN BY: 28701
Delivery systems: POSITIVE
Drawn by: 295031
Expiratory PAP: 8
FIO2: 1 %
FIO2: 1 %
INSPIRATORY PAP: 14
MODE: POSITIVE
O2 Saturation: 93.5 %
O2 Saturation: 96.2 %
PCO2 ART: 45.5 mmHg — AB (ref 35.0–45.0)
PH ART: 7.451 — AB (ref 7.350–7.450)
Patient temperature: 98.6
Patient temperature: 100.8
RATE: 12 resp/min
TCO2: 23.1 mmol/L (ref 0–100)
TCO2: 28 mmol/L (ref 0–100)
pCO2 arterial: 36.9 mmHg (ref 35.0–45.0)
pH, Arterial: 7.435 (ref 7.350–7.450)
pO2, Arterial: 78.8 mmHg — ABNORMAL LOW (ref 80.0–100.0)
pO2, Arterial: 115 mmHg — ABNORMAL HIGH (ref 80.0–100.0)

## 2013-12-20 LAB — CBC WITH DIFFERENTIAL/PLATELET
Band Neutrophils: 22 % — ABNORMAL HIGH (ref 0–10)
Basophils Absolute: 0 10*3/uL (ref 0.0–0.1)
Basophils Relative: 0 % (ref 0–1)
Eosinophils Absolute: 0.3 10*3/uL (ref 0.0–0.7)
Eosinophils Relative: 5 % (ref 0–5)
HCT: 24.9 % — ABNORMAL LOW (ref 36.0–46.0)
Hemoglobin: 7.8 g/dL — ABNORMAL LOW (ref 12.0–15.0)
Lymphocytes Relative: 22 % (ref 12–46)
Lymphs Abs: 1.2 10*3/uL (ref 0.7–4.0)
MCH: 29.7 pg (ref 26.0–34.0)
MCHC: 31.3 g/dL (ref 30.0–36.0)
MCV: 94.7 fL (ref 78.0–100.0)
Metamyelocytes Relative: 3 %
Monocytes Absolute: 0.4 10*3/uL (ref 0.1–1.0)
Monocytes Relative: 7 % (ref 3–12)
Myelocytes: 5 %
Neutro Abs: 3.7 10*3/uL (ref 1.7–7.7)
Neutrophils Relative %: 36 % — ABNORMAL LOW (ref 43–77)
Platelets: 55 10*3/uL — ABNORMAL LOW (ref 150–400)
RBC: 2.63 MIL/uL — ABNORMAL LOW (ref 3.87–5.11)
RDW: 19.4 % — ABNORMAL HIGH (ref 11.5–15.5)
WBC: 5.6 10*3/uL (ref 4.0–10.5)
nRBC: 35 /100 WBC — ABNORMAL HIGH

## 2013-12-20 LAB — BASIC METABOLIC PANEL
BUN: 53 mg/dL — ABNORMAL HIGH (ref 6–23)
CO2: 19 mEq/L (ref 19–32)
Calcium: 7.7 mg/dL — ABNORMAL LOW (ref 8.4–10.5)
Chloride: 91 mEq/L — ABNORMAL LOW (ref 96–112)
Creatinine, Ser: 0.58 mg/dL (ref 0.50–1.10)
GFR calc Af Amer: >90 mL/min (ref >90)
GFR calc non Af Amer: >90 mL/min (ref >90)
Glucose, Bld: 83 mg/dL (ref 70–99)
Potassium: 5 mEq/L (ref 3.7–5.3)
Sodium: 136 mEq/L — ABNORMAL LOW (ref 137–147)

## 2013-12-20 LAB — MRSA PCR SCREENING: MRSA by PCR: POSITIVE — AB

## 2013-12-20 LAB — PRO B NATRIURETIC PEPTIDE: Pro B Natriuretic peptide (BNP): 10986 pg/mL — ABNORMAL HIGH (ref 0–125)

## 2013-12-20 LAB — CBG MONITORING, ED: GLUCOSE-CAPILLARY: 132 mg/dL — AB (ref 70–99)

## 2013-12-20 LAB — HEMOGLOBIN A1C
HEMOGLOBIN A1C: 7.4 % — AB (ref <5.7)
MEAN PLASMA GLUCOSE: 166 mg/dL — AB (ref <117)

## 2013-12-20 LAB — TROPONIN I: Troponin I: <0.3 ng/mL (ref <0.30)

## 2013-12-20 MED ORDER — SODIUM CHLORIDE 0.9 % IV SOLN
INTRAVENOUS | Status: DC
  Administered 2013-12-20: 22:00:00 via INTRAVENOUS

## 2013-12-20 MED ORDER — CALCIUM CITRATE-VITAMIN D 315-200 MG-UNIT PO TABS: 1.0000 | ORAL_TABLET | Freq: Every day | ORAL | Status: DC

## 2013-12-20 MED ORDER — DEXTROSE 5 % IV SOLN: 500.0000 mg | INTRAVENOUS | Status: DC

## 2013-12-20 MED ORDER — INSULIN ASPART 100 UNIT/ML ~~LOC~~ SOLN: 0.0000 [IU] | Freq: Three times a day (TID) | SUBCUTANEOUS | Status: DC

## 2013-12-20 MED ORDER — IPRATROPIUM-ALBUTEROL 0.5-2.5 (3) MG/3ML IN SOLN
3.0000 mL | Freq: Once | RESPIRATORY_TRACT | Status: DC
  Administered 2013-12-20: 3 mL via RESPIRATORY_TRACT
  Filled 2013-12-20: qty 3

## 2013-12-20 MED ORDER — OXYCODONE-ACETAMINOPHEN 7.5-325 MG PO TABS: 1.0000 | ORAL_TABLET | ORAL | Status: DC | PRN

## 2013-12-20 MED ORDER — INSULIN DETEMIR 100 UNIT/ML ~~LOC~~ SOLN
20.0000 [IU] | Freq: Every day | SUBCUTANEOUS | Status: DC
  Filled 2013-12-20 (×2): qty 0.2

## 2013-12-20 MED ORDER — ONDANSETRON HCL 4 MG/2ML IJ SOLN: 4.0000 mg | Freq: Four times a day (QID) | INTRAMUSCULAR | Status: DC | PRN

## 2013-12-20 MED ORDER — FENTANYL CITRATE 0.05 MG/ML IJ SOLN
100.0000 ug | Freq: Once | INTRAMUSCULAR | Status: AC
  Administered 2013-12-20: 100 ug via INTRAVENOUS
  Filled 2013-12-20: qty 2

## 2013-12-20 MED ORDER — IPRATROPIUM-ALBUTEROL 0.5-2.5 (3) MG/3ML IN SOLN: 3.0000 mL | RESPIRATORY_TRACT | Status: DC | PRN

## 2013-12-20 MED ORDER — ATORVASTATIN CALCIUM 10 MG PO TABS: 20.0000 mg | ORAL_TABLET | Freq: Every day | ORAL | Status: DC

## 2013-12-20 MED ORDER — FUROSEMIDE 10 MG/ML IJ SOLN: 40.0000 mg | Freq: Every day | INTRAMUSCULAR | Status: DC

## 2013-12-20 MED ORDER — ALBUTEROL SULFATE (2.5 MG/3ML) 0.083% IN NEBU: 5.0000 mg | INHALATION_SOLUTION | Freq: Once | RESPIRATORY_TRACT | Status: DC

## 2013-12-20 MED ORDER — CHLORHEXIDINE GLUCONATE 0.12 % MT SOLN: 15.0000 mL | Freq: Two times a day (BID) | OROMUCOSAL | Status: DC

## 2013-12-20 MED ORDER — VITAMIN E 180 MG (400 UNIT) PO CAPS
400.0000 [IU] | ORAL_CAPSULE | Freq: Every day | ORAL | Status: DC
  Filled 2013-12-20: qty 1

## 2013-12-20 MED ORDER — FUROSEMIDE 10 MG/ML IJ SOLN
40.0000 mg | Freq: Once | INTRAMUSCULAR | Status: AC
  Administered 2013-12-20: 40 mg via INTRAVENOUS
  Filled 2013-12-20: qty 4

## 2013-12-20 MED ORDER — IPRATROPIUM-ALBUTEROL 0.5-2.5 (3) MG/3ML IN SOLN
3.0000 mL | RESPIRATORY_TRACT | Status: DC
  Administered 2013-12-20 – 2013-12-21 (×5): 3 mL via RESPIRATORY_TRACT
  Filled 2013-12-20 (×5): qty 3

## 2013-12-20 MED ORDER — PANTOPRAZOLE SODIUM 40 MG PO TBEC: 40.0000 mg | DELAYED_RELEASE_TABLET | Freq: Every day | ORAL | Status: DC

## 2013-12-20 MED ORDER — LORAZEPAM 1 MG PO TABS: 1.0000 mg | ORAL_TABLET | Freq: Every day | ORAL | Status: DC

## 2013-12-20 MED ORDER — PAROXETINE HCL 30 MG PO TABS
30.0000 mg | ORAL_TABLET | Freq: Every day | ORAL | Status: DC
  Filled 2013-12-20: qty 1

## 2013-12-20 MED ORDER — DEXTROSE 5 % IV SOLN
1.0000 g | Freq: Three times a day (TID) | INTRAVENOUS | Status: DC
  Administered 2013-12-20: 1 g via INTRAVENOUS
  Filled 2013-12-20: qty 1

## 2013-12-20 MED ORDER — MORPHINE SULFATE 2 MG/ML IJ SOLN
2.0000 mg | INTRAMUSCULAR | Status: DC | PRN
  Administered 2013-12-20 (×2): 2 mg via INTRAVENOUS
  Administered 2013-12-21 (×3): 4 mg via INTRAVENOUS
  Filled 2013-12-20: qty 1
  Filled 2013-12-20 (×3): qty 2
  Filled 2013-12-20: qty 1

## 2013-12-20 MED ORDER — GUAIFENESIN ER 600 MG PO TB12: 600.0000 mg | ORAL_TABLET | Freq: Two times a day (BID) | ORAL | Status: DC

## 2013-12-20 MED ORDER — ALBUTEROL SULFATE (2.5 MG/3ML) 0.083% IN NEBU
2.5000 mg | INHALATION_SOLUTION | Freq: Once | RESPIRATORY_TRACT | Status: AC
  Administered 2013-12-20: 2.5 mg via RESPIRATORY_TRACT
  Filled 2013-12-20: qty 3

## 2013-12-20 MED ORDER — ACETAMINOPHEN 650 MG RE SUPP
650.0000 mg | Freq: Four times a day (QID) | RECTAL | Status: DC | PRN
  Administered 2013-12-20: 650 mg via RECTAL
  Filled 2013-12-20: qty 1

## 2013-12-20 MED ORDER — DOCUSATE SODIUM 100 MG PO CAPS: 100.0000 mg | ORAL_CAPSULE | Freq: Two times a day (BID) | ORAL | Status: DC

## 2013-12-20 MED ORDER — DEXTROSE 5 % IV SOLN
500.0000 mg | Freq: Once | INTRAVENOUS | Status: AC
  Administered 2013-12-20: 500 mg via INTRAVENOUS
  Filled 2013-12-20: qty 500

## 2013-12-20 MED ORDER — LORAZEPAM 2 MG/ML IJ SOLN
0.5000 mg | Freq: Once | INTRAMUSCULAR | Status: AC
  Administered 2013-12-20: 0.5 mg via INTRAVENOUS
  Filled 2013-12-20: qty 1

## 2013-12-20 MED ORDER — ACETAMINOPHEN 325 MG PO TABS
650.0000 mg | ORAL_TABLET | Freq: Four times a day (QID) | ORAL | Status: DC | PRN
Start: 2013-12-20 — End: 2013-12-20

## 2013-12-20 MED ORDER — LORAZEPAM 2 MG/ML IJ SOLN
1.0000 mg | Freq: Once | INTRAMUSCULAR | Status: AC
  Administered 2013-12-20: 1 mg via INTRAVENOUS
  Filled 2013-12-20: qty 1

## 2013-12-20 MED ORDER — ADULT MULTIVITAMIN W/MINERALS CH: 1.0000 | ORAL_TABLET | Freq: Every day | ORAL | Status: DC

## 2013-12-20 MED ORDER — VANCOMYCIN HCL IN DEXTROSE 1-5 GM/200ML-% IV SOLN
1000.0000 mg | Freq: Two times a day (BID) | INTRAVENOUS | Status: DC
  Administered 2013-12-20: 1000 mg via INTRAVENOUS
  Filled 2013-12-20: qty 200

## 2013-12-20 MED ORDER — METFORMIN HCL 500 MG PO TABS
1000.0000 mg | ORAL_TABLET | Freq: Two times a day (BID) | ORAL | Status: DC
  Filled 2013-12-20: qty 2

## 2013-12-20 MED ORDER — AZATHIOPRINE 50 MG PO TABS
50.0000 mg | ORAL_TABLET | Freq: Two times a day (BID) | ORAL | Status: DC
  Filled 2013-12-20: qty 1

## 2013-12-20 MED ORDER — FOLIC ACID 1 MG PO TABS: 1.0000 mg | ORAL_TABLET | Freq: Two times a day (BID) | ORAL | Status: DC

## 2013-12-20 MED ORDER — OXYCODONE HCL 5 MG PO TABS: 2.5000 mg | ORAL_TABLET | ORAL | Status: DC | PRN

## 2013-12-20 MED ORDER — DEXTROSE 5 % IV SOLN
1.0000 g | Freq: Once | INTRAVENOUS | Status: AC
  Administered 2013-12-20: 1 g via INTRAVENOUS
  Filled 2013-12-20: qty 10

## 2013-12-20 MED ORDER — METHYLPREDNISOLONE SODIUM SUCC 125 MG IJ SOLR
60.0000 mg | Freq: Two times a day (BID) | INTRAMUSCULAR | Status: DC
  Administered 2013-12-20: 60 mg via INTRAVENOUS
  Filled 2013-12-20: qty 2

## 2013-12-20 MED ORDER — IPRATROPIUM BROMIDE 0.02 % IN SOLN: 0.5000 mg | Freq: Once | RESPIRATORY_TRACT | Status: DC

## 2013-12-20 MED ORDER — DEXTROSE 5 % IV SOLN: 1.0000 g | INTRAVENOUS | Status: DC

## 2013-12-20 MED ORDER — ACETAMINOPHEN 650 MG RE SUPP
650.0000 mg | Freq: Four times a day (QID) | RECTAL | Status: DC | PRN
Start: 2013-12-20 — End: 2013-12-20

## 2013-12-20 MED ORDER — MORPHINE SULFATE ER 15 MG PO TBCR: 15.0000 mg | EXTENDED_RELEASE_TABLET | Freq: Two times a day (BID) | ORAL | Status: DC

## 2013-12-20 MED ORDER — SODIUM CHLORIDE 0.9 % IJ SOLN: 3.0000 mL | Freq: Two times a day (BID) | INTRAMUSCULAR | Status: DC

## 2013-12-20 MED ORDER — ONDANSETRON HCL 4 MG PO TABS: 4.0000 mg | ORAL_TABLET | Freq: Four times a day (QID) | ORAL | Status: DC | PRN

## 2013-12-20 MED ORDER — INSULIN ASPART 100 UNIT/ML ~~LOC~~ SOLN: 0.0000 [IU] | Freq: Every day | SUBCUTANEOUS | Status: DC

## 2013-12-20 MED ORDER — PREDNISONE 20 MG PO TABS: 10.0000 mg | ORAL_TABLET | Freq: Every day | ORAL | Status: DC

## 2013-12-20 MED ORDER — BIOTENE DRY MOUTH MT LIQD: 15.0000 mL | Freq: Two times a day (BID) | OROMUCOSAL | Status: DC

## 2013-12-20 MED ORDER — VITAMIN B-12 100 MCG PO TABS
100.0000 ug | ORAL_TABLET | Freq: Every day | ORAL | Status: DC
  Filled 2013-12-20: qty 1

## 2013-12-20 MED ORDER — OXYBUTYNIN CHLORIDE ER 5 MG PO TB24: 10.0000 mg | ORAL_TABLET | Freq: Every day | ORAL | Status: DC

## 2013-12-20 MED ORDER — LORAZEPAM 2 MG/ML IJ SOLN
1.0000 mg | Freq: Four times a day (QID) | INTRAMUSCULAR | Status: DC
  Administered 2013-12-20: 1 mg via INTRAVENOUS
  Filled 2013-12-20: qty 1

## 2013-12-20 MED ORDER — NIACIN 500 MG PO TABS
500.0000 mg | ORAL_TABLET | Freq: Every day | ORAL | Status: DC
  Filled 2013-12-20: qty 1

## 2013-12-20 MED ORDER — PROCHLORPERAZINE MALEATE 10 MG PO TABS
10.0000 mg | ORAL_TABLET | Freq: Four times a day (QID) | ORAL | Status: DC | PRN
  Filled 2013-12-20: qty 1

## 2013-12-20 MED ORDER — OXYCODONE-ACETAMINOPHEN 5-325 MG PO TABS: 1.0000 | ORAL_TABLET | ORAL | Status: DC | PRN

## 2013-12-20 MED ORDER — CALCIUM CITRATE-VITAMIN D 500-400 MG-UNIT PO CHEW
1.0000 | CHEWABLE_TABLET | Freq: Every day | ORAL | Status: DC
  Filled 2013-12-20: qty 1

## 2013-12-20 NOTE — ED Notes (Signed)
Marzetta Board, RN updated on pt's condition and changes.

## 2013-12-20 NOTE — Consult Note (Signed)
PULMONARY  / CRITICAL CARE MEDICINE  Name: Jessica Rollins MRN: [id number] DOB: 12-03-1955    ADMISSION DATE:  12/24/2013 CONSULTATION DATE:  12/11/2013  REFERRING MD :  Leisa Lenz, MD PRIMARY SERVICE: [facility]  CHIEF COMPLAINT:  SOB  BRIEF PATIENT DESCRIPTION:  58 yo female with pulmonary fibrosis, OSA, COPD and metastatic small cell lung cancer presenting to ED with acute on chronic hypoxemic respiratory failure requiring BiPAP and with CXR concerning for pneumonia and edema. We are consulted for evaluation and management of hypoxemic respiratory failure.  SIGNIFICANT EVENTS / STUDIES: 12/24/2013 - Admission to [facility], started on BiPAP  LINES / TUBES: PIV  CULTURES: 12/12/2013 Sputum culture pending 12/24/2013 Urine culture pending  ANTIBIOTICS: Ceftriaxone 12/28/2013 >>> 01/03/2014 Azithromycin 12/28/2013 >>> Vancomycin 12/11/2013 >>> Cefepime 01/02/2014 >>>  HISTORY OF PRESENT ILLNESS:   58 yo female with pulmonary fibrosis on chronic immumosuppression, COPD, OSA and metastatic small cell lung cancer who presented to [facility] ED with encephalopathy and confusion x 1-2 days. The history if obtained from her family given patient's current clinical status. Her family reports that her main symptom was confusion and recent pain, they otherwise deny symptoms of increase in cough or sputum production. In the ED, patient noted to by hypoxic and febrile, with CXR showing prominent mediastinal/hilar adenopathy and concern for pneumonia and edema. She was started on ceftriaxone and azithromycin and maintained on BiPAP. She also had MRI Brain done which had no intracranial findings, but did show likely osseous skull/cervical spine metastases. After an extensive discussion with her family, it was decided to make her DNR, provide IV medications/antibiotics but to otherwise focus on comfort. Her mother and son are expecting to meet with palliative care tomorrow. In terms of her lung  disease, she is followed by Dr. Lamonte Sakai. She is currently being treated with cisplatin and irinotecan for the metastatic small cell lung cancer, most recently 12/15/2013.   PAST MEDICAL HISTORY :  Past Medical History  Diagnosis Date  . COPD (chronic obstructive pulmonary disease)   . Pulmonary fibrosis     diagnosed with this 2001.    . Diabetes mellitus without complication   . Hiatal hernia   . Depression   . Anxiety   . High cholesterol   . Hypertension   . Osteoporosis   . OSA on CPAP     13cm  . Overactive bladder   . Heart murmur   . Cancer     Lung  . Lung cancer 08/18/13    right supraclavicular   Past Surgical History  Procedure Laterality Date  . Bladder suspension    . Abdominal hysterectomy      severe bleeding-when she was in her 30's  . Lung biopsy     Prior to Admission medications   Medication Sig Start Date End Date Taking? Authorizing Provider  albuterol (PROVENTIL) (2.5 MG/3ML) 0.083% nebulizer solution Take 3 mLs (2.5 mg total) by nebulization every 2 (two) hours as needed for wheezing or shortness of breath. 08/11/13  Yes Shanker Kristeen Mans, MD  azaTHIOprine (IMURAN) 50 MG tablet Take 50 mg by mouth 2 (two) times daily.   Yes Historical Provider, MD  calcium citrate-vitamin D (CITRACAL+D) 315-200 MG-UNIT per tablet Take 1 tablet by mouth daily.   Yes Historical Provider, MD  Cimetidine (TAGAMET PO) Take 1 tablet by mouth daily.   Yes Historical Provider, MD  cyanocobalamin 1000 MCG tablet Take 100 mcg by mouth daily.   Yes Historical Provider, MD  docusate sodium (  COLACE) 100 MG capsule Take 1 capsule (100 mg total) by mouth 2 (two) times daily. 12/15/13  Yes Virgel Manifold, MD  esomeprazole (NEXIUM) 40 MG capsule Take 40 mg by mouth daily before breakfast.   Yes Historical Provider, MD  Fluticasone-Salmeterol (ADVAIR DISKUS) 250-50 MCG/DOSE AEPB Inhale 1 puff into the lungs 2 (two) times daily. 10/07/13  Yes Collene Gobble, MD  folic acid (FOLVITE) 1 MG tablet Take  1 mg by mouth 2 (two) times daily.   Yes Historical Provider, MD  furosemide (LASIX) 20 MG tablet Take 20 mg by mouth daily. 12/18/13  Yes Curt Bears, MD  guaiFENesin (MUCINEX) 600 MG 12 hr tablet Take 600 mg by mouth 2 (two) times daily.   Yes Historical Provider, MD  Insulin Detemir (LEVEMIR FLEXPEN [place]) Inject 20 Units into the skin daily.   Yes Historical Provider, MD  IRON PO Take 1 tablet by mouth 4 (four) times daily.   Yes Historical Provider, MD  LORazepam (ATIVAN) 1 MG tablet Take 1 mg by mouth at bedtime.   Yes Historical Provider, MD  metFORMIN (GLUCOPHAGE) 500 MG tablet Take 1,000 mg by mouth 2 (two) times daily with a meal.    Yes Historical Provider, MD  methylPREDNIsolone (MEDROL DOSPACK) 4 MG tablet Take 4 mg by mouth as directed. follow package directions 12/18/13  Yes Curt Bears, MD  morphine (MS CONTIN) 15 MG 12 hr tablet Take 1 tablet (15 mg total) by mouth every 12 (twelve) hours. 12/15/13  Yes Virgel Manifold, MD  Multiple Vitamin (MULTIVITAMIN WITH MINERALS) TABS tablet Take 1 tablet by mouth daily. 08/11/13  Yes Shanker Kristeen Mans, MD  niacin 500 MG tablet Take 500 mg by mouth daily.   Yes Historical Provider, MD  Omega-3 Fatty Acids (FISH OIL) 300 MG CAPS Take 1 capsule by mouth daily.   Yes Historical Provider, MD  oxybutynin (DITROPAN-XL) 10 MG 24 hr tablet Take 10 mg by mouth daily.   Yes Historical Provider, MD  oxyCODONE-acetaminophen (PERCOCET) 7.5-325 MG per tablet Take 1-2 tablets by mouth every 4 (four) hours as needed for pain. 12/15/13  Yes Virgel Manifold, MD  PARoxetine (PAXIL) 30 MG tablet Take 30 mg by mouth daily.   Yes Historical Provider, MD  predniSONE (DELTASONE) 10 MG tablet Take 10 mg by mouth daily with breakfast.  08/29/13  Yes Historical Provider, MD  prochlorperazine (COMPAZINE) 10 MG tablet Take 1 tablet (10 mg total) by mouth every 6 (six) hours as needed for nausea or vomiting. 11/18/13  Yes Curt Bears, MD  rosuvastatin (CRESTOR) 10 MG tablet  Take 10 mg by mouth daily.   Yes Historical Provider, MD  SPIRIVA HANDIHALER 18 MCG inhalation capsule Place 18 mcg into inhaler and inhale daily.  08/26/13  Yes Historical Provider, MD  vitamin E 400 UNIT capsule Take 400 Units by mouth daily.   Yes Historical Provider, MD   No Known Allergies  FAMILY HISTORY:  Family History  Problem Relation Age of Onset  . Pulmonary fibrosis Brother    SOCIAL HISTORY:  reports that she has been smoking Cigarettes.  She started smoking about 50 years ago. She has a 31.5 pack-year smoking history. She has never used smokeless tobacco. She reports that she does not drink alcohol or use illicit drugs.  REVIEW OF SYSTEMS:  Unable to obtain secondary to patient's encephalopathy and on BiPAP  SUBJECTIVE:   VITAL SIGNS: Temp:  [98.2 F (36.8 C)-102 F (38.9 C)] 100.8 F (38.2 C) (06/13 1823) Pulse Rate:  [  99-127] 100 (06/13 2100) Resp:  [18-28] 19 (06/13 2100) BP: (88-152)/(39-67) 88/49 mmHg (06/13 2100) SpO2:  [91 %-100 %] 100 % (06/13 2100) FiO2 (%):  [100 %] 100 % (06/13 2022) Weight:  [168 lb 3.4 oz (76.3 kg)] 168 lb 3.4 oz (76.3 kg) (06/13 2153) HEMODYNAMICS:   VENTILATOR SETTINGS: Vent Mode:  [-]  FiO2 (%):  [100 %] 100 % INTAKE / OUTPUT: Intake/Output     06/13 0701 - 06/14 0700   Urine (mL/kg/hr) 400   Total Output 400   Net -400         PHYSICAL EXAMINATION: General:  Somnolent but arouses to voice, on BiPAP Neuro:  Somnolent, briefly arouses to voice, able to move all extremities HEENT:  AT, [place], EOMI, BiPAP in place  Cardiovascular:  Tachycardic, +2 distal pulses, +2 BLE edema Lungs:  Bilateral rhonchi Abdomen:  Soft, nontender, nondistended Musculoskeletal:  No clubbing or cyanosis Skin:  No rash  LABS:  CBC Recent Labs     01/01/2014  1204  WBC  5.6  HGB  7.8*  HCT  24.9*  PLT  55*   Coag's No results found for this basename: APTT, INR,  in the last 72 hours BMET Recent Labs     12/18/2013  1204  NA  136*  K   5.0  CL  91*  CO2  19  BUN  53*  CREATININE  0.58  GLUCOSE  83   Electrolytes Recent Labs     12/13/2013  1204  CALCIUM  7.7*   Sepsis Markers No results found for this basename: LACTICACIDVEN, PROCALCITON, O2SATVEN,  in the last 72 hours ABG Recent Labs     01/06/2014  1144  01/01/2014  2015  PHART  7.435  7.451*  PCO2ART  36.9  45.5*  PO2ART  78.8*  115.0*   Liver Enzymes No results found for this basename: AST, ALT, ALKPHOS, BILITOT, ALBUMIN,  in the last 72 hours Cardiac Enzymes Recent Labs     12/16/2013  1204  01/03/2014  1206  TROPONINI  <0.30   --   PROBNP   --   10986.0*   Glucose Recent Labs     12/22/2013  1747  GLUCAP  132*    Imaging Mr Brain W Wo Contrast  12/29/2013   CLINICAL DATA:  Metastatic small-cell lung cancer. Headaches. New onset weakness.  EXAM: MRI HEAD WITHOUT AND WITH CONTRAST  TECHNIQUE: Multiplanar, multiecho pulse sequences of the brain and surrounding structures were obtained without and with intravenous contrast.  CONTRAST:  80mL MULTIHANCE GADOBENATE DIMEGLUMINE 529 MG/ML IV SOLN  COMPARISON:  09/03/2013  FINDINGS: Bone marrow signal in the skull and the visualized upper cervical spine appears diffusely diminished compared to the prior study. There is no acute infarct. There is no evidence of intracranial hemorrhage, mass, midline shift, or extra-axial fluid collection. Several small foci of T2 hyperintensity in the cerebral white matter are unchanged and nonspecific, compatible with mild chronic small vessel ischemic disease. There is no abnormal brain parenchymal or meningeal enhancement.  Prior right cataract extraction is noted. Trace right mastoid fluid is present. Major intracranial vascular flow voids are preserved.  IMPRESSION: 1. No evidence of acute intracranial abnormality or parenchymal or meningeal brain metastases. 2. Abnormal bone marrow signal in the skull and visualized cervical spine, likely reflecting underlying widespread osseous  metastases.   Electronically Signed   By: Logan Bores   On: 01/03/2014 08:22   Dg Chest Portable 1 View  01/04/2014  CLINICAL DATA:  Shortness of breath  EXAM: PORTABLE CHEST - 1 VIEW  COMPARISON:  12/08/2013  FINDINGS: There is prominence of to right upper mediastinum and right hilar region probable due to adenopathy. Cardiac size is stable. Hazy airspace disease right midlung suspicious for postobstructive pneumonia or asymmetric edema. Hazy bilateral basilar atelectasis or infiltrate.  IMPRESSION: Prominent right upper mediastinum and right hilar region probable due to adenopathy. Hazy airspace is right midlung suspicious for postobstructive pneumonia or asymmetric edema. Hazy bilateral basilar atelectasis or infiltrate.   Electronically Signed   By: Lahoma Crocker M.D.   On: 12/22/2013 12:54    ASSESSMENT / PLAN:  PULMONARY A:  Acute on chronic hypoxemic respiratory failure Pneumonia COPD OSA, on CPAP at home ILD, on chronic immunosuppression with prednisone and azathioprine Metastatic small cell lung cancer, currently on chemotherapy  P:   - Patient currently comfortable on BiPAP as she wears CPAP at home. Consider transition to nasal cannula if transitioning to focus on comfort. Family comfortable with BiPAP at this time. - Family again confirmed DNR code status, agree with morphine for comfort as needed. - Concern for HCAP given immunocompromised status at baseline, current chemotherapy infusions, broadened coverage to vancomycin, cefepime and azithromycin - Culture pending: Blood, sputum, urine, strep pneumonia, legionella - Agree with solumedrol for COPD and ILD, taper as able - Albuterol/ipratroprium nebs q4 and q2 PRN  CARDIOVASCULAR A: Acute diastolic CHF with volume overload  P:  - Elevated pro-BNP on admission and edema on exam, agree with diuresis as able - Echocardiogram pending  INFECTIOUS A:   Pneumonia, concern for HCAP  P:   - Coverage broadened as above -  Cultures pending as above, tailor therapy as able   TODAY'S SUMMARY:  Patient admitted with acute on chronic hypoxemic respiratory failure, concern for pneumonia and volume overload. After discussions with family, given patient's overall prognosis, they confirm DNR status and are likely planning to focus on patient's comfort.  I have personally obtained a history, examined the patient, evaluated laboratory and imaging results, formulated the assessment and plan and placed orders.   Maricela Curet, MD, PhD Pulmonary and [place] Pager: [phone]  12/18/2013, 10:11 PM

## 2013-12-20 NOTE — Progress Notes (Signed)
Pt seen and examined at bedside, noted to be in respiratory distress, currently on BiPAP. Pt not following any commands and with very diminished air movement. Pt also very agitated and trying to get out of the bed. Asked for ABG now. I have discussed with family current critical illness and possibility that pt may not survive this event. 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 Son and other family at bedside opted to ensure comfort for pt, avoiding intubation and CPR but allowing to continue ABX and IV medications if needed to ensure comfort. Chaplain at bedside as well. I spoke with Dr. Lovena Le whose assistance is greatly appreciated. She has recommended morphine as needed and can be given while pt on BiPAP, if needed we can also transition to morphine drip if boluses of Morphine don't help. Family in agreement with plan and understand fully that their loved  One may not survive through this. Again, ensuring comfort for the pt is very important to the family and I have ensure them that the wishes will be respected. I have provided emotional support and told family I will be available if they have any questions or concerns. I provided them with my number in case they need to get in touch with me. Please note I have placed order for Morphine Q2 hours as needed and the dose can be titrated as needed.   Faye Ramsay, MD  [facility] Pager [phone] Cell [phone]  If 7PM-7AM, please contact night-coverage [email or web address] Password [email or web address]

## 2013-12-20 NOTE — Progress Notes (Signed)
ANTIBIOTIC CONSULT NOTE - INITIAL  Pharmacy Consult for Cefepime/Vancomycin Indication: HCAP  No Known Allergies  Patient Measurements: Height: 5' 2.99" (160 cm) Weight: 168 lb 3.4 oz (76.3 kg) IBW/kg (Calculated) : 52.38   Vital Signs: Temp: 100.8 F (38.2 C) (06/13 1823) Temp src: Axillary (06/13 1823) BP: 88/49 mmHg (06/13 2100) Pulse Rate: 100 (06/13 2100) Intake/Output from previous day:   Intake/Output from this shift:    Labs:  Recent Labs  12/27/2013 1204  WBC 5.6  HGB 7.8*  PLT 55*  CREATININE 0.58   Estimated Creatinine Clearance: 75 ml/min (by C-G formula based on Cr of 0.58). No results found for this basename: VANCOTROUGH, VANCOPEAK, VANCORANDOM, GENTTROUGH, GENTPEAK, GENTRANDOM, TOBRATROUGH, TOBRAPEAK, TOBRARND, AMIKACINPEAK, AMIKACINTROU, AMIKACIN,  in the last 72 hours   Microbiology: No results found for this or any previous visit (from the past 720 hour(s)).  Medical History: Past Medical History  Diagnosis Date  . COPD (chronic obstructive pulmonary disease)   . Pulmonary fibrosis     diagnosed with this 2001.    . Diabetes mellitus without complication   . Hiatal hernia   . Depression   . Anxiety   . High cholesterol   . Hypertension   . Osteoporosis   . OSA on CPAP     13cm  . Overactive bladder   . Heart murmur   . Cancer     Lung  . Lung cancer 08/18/13    right supraclavicular    Medications:  Scheduled:  . [START ON January 01, 2014] antiseptic oral rinse  15 mL Mouth Rinse q12n4p  . azithromycin  500 mg Intravenous Q24H  . ceFEPime (MAXIPIME) IV  1 g Intravenous Q8H  . [START ON 01-Jan-2014] chlorhexidine  15 mL Mouth Rinse BID  . furosemide  40 mg Intravenous Daily  . insulin aspart  0-15 Units Subcutaneous TID WC  . insulin detemir  20 Units Subcutaneous Daily  . ipratropium-albuterol  3 mL Nebulization Q4H  . methylPREDNISolone (SOLU-MEDROL) injection  60 mg Intravenous Q12H  . sodium chloride  3 mL Intravenous Q12H  .  vancomycin  1,000 mg Intravenous Q12H   Infusions:  . 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 Cefepime and Vancomycin per Rx for HCAP.   Goal of Therapy:  Vancomycin trough level 15-20 mcg/ml  Plan:   Cefepime 1Gm IV q8h  Vancomycin 1Gm IV q12h  F/u SCr/cultures/levels  Lawana Pai R 01/02/2014,10:13 PM

## 2013-12-20 NOTE — ED Provider Notes (Signed)
CSN: [id number]     Arrival date & time 01/02/2014  1104 History   First MD Initiated Contact with Patient 12/12/2013 1139     Chief Complaint  Patient presents with  . Shortness of Breath  . Altered Mental Status     (Consider location/radiation/quality/duration/timing/severity/associated sxs/prior Treatment) HPI  58 year old female with dyspnea. Worsening over the past 4 days. Patient is known to me from recent ER evaluation. History of widely metastatic cancer and seen for poorly controlled pain. In past couple days have had increasing SOB and LE swelling. Increasing confusion since yesterday. Pulling off CPAP last night and oxygen today. Pain has been better controlled, but more tired since increased. Has not had pain medications today though. No fever. Pain has not changed in character. Pt has hx of COPD/Pulmonary fibrosis in addition to fairly recently diagnosed non small cell lung CA.   Past Medical History  Diagnosis Date  . COPD (chronic obstructive pulmonary disease)   . Pulmonary fibrosis     diagnosed with this 2001.    . Diabetes mellitus without complication   . Hiatal hernia   . Depression   . Anxiety   . High cholesterol   . Hypertension   . Osteoporosis   . OSA on CPAP     13cm  . Overactive bladder   . Heart murmur   . Cancer     Lung  . Lung cancer 08/18/13    right supraclavicular   Past Surgical History  Procedure Laterality Date  . Bladder suspension    . Abdominal hysterectomy      severe bleeding-when she was in her 30's  . Lung biopsy     Family History  Problem Relation Age of Onset  . Pulmonary fibrosis Brother    History  Substance Use Topics  . Smoking status: Current Every Day Smoker -- 0.75 packs/day for 42 years    Types: Cigarettes    Start date: 07/11/1963  . Smokeless tobacco: Never Used     Comment: decreased to 14 daily  . Alcohol Use: No   OB History   Grav Para Term Preterm Abortions TAB SAB Ect Mult Living                  Review of Systems  Level 5 caveat because pt cannot appropriately answer questions.    Allergies  Review of patient's allergies indicates no known allergies.  Home Medications   Prior to Admission medications   Medication Sig Start Date End Date Taking? Authorizing Provider  albuterol (PROVENTIL) (2.5 MG/3ML) 0.083% nebulizer solution Take 3 mLs (2.5 mg total) by nebulization every 2 (two) hours as needed for wheezing or shortness of breath. 08/11/13  Yes Shanker Kristeen Mans, MD  azaTHIOprine (IMURAN) 50 MG tablet Take 50 mg by mouth 2 (two) times daily.   Yes Historical Provider, MD  calcium citrate-vitamin D (CITRACAL+D) 315-200 MG-UNIT per tablet Take 1 tablet by mouth daily.   Yes Historical Provider, MD  Cimetidine (TAGAMET PO) Take 1 tablet by mouth daily.   Yes Historical Provider, MD  cyanocobalamin 1000 MCG tablet Take 100 mcg by mouth daily.   Yes Historical Provider, MD  docusate sodium (COLACE) 100 MG capsule Take 1 capsule (100 mg total) by mouth 2 (two) times daily. 12/15/13  Yes Virgel Manifold, MD  esomeprazole (NEXIUM) 40 MG capsule Take 40 mg by mouth daily before breakfast.   Yes Historical Provider, MD  Fluticasone-Salmeterol (ADVAIR DISKUS) 250-50 MCG/DOSE AEPB Inhale 1 puff into  the lungs 2 (two) times daily. 10/07/13  Yes Collene Gobble, MD  folic acid (FOLVITE) 1 MG tablet Take 1 mg by mouth 2 (two) times daily.   Yes Historical Provider, MD  furosemide (LASIX) 20 MG tablet Take 20 mg by mouth daily. 12/18/13  Yes Curt Bears, MD  guaiFENesin (MUCINEX) 600 MG 12 hr tablet Take 600 mg by mouth 2 (two) times daily.   Yes Historical Provider, MD  Insulin Detemir (LEVEMIR FLEXPEN [place]) Inject 20 Units into the skin daily.   Yes Historical Provider, MD  IRON PO Take 1 tablet by mouth 4 (four) times daily.   Yes Historical Provider, MD  LORazepam (ATIVAN) 1 MG tablet Take 1 mg by mouth at bedtime.   Yes Historical Provider, MD  metFORMIN (GLUCOPHAGE) 500 MG tablet Take 1,000  mg by mouth 2 (two) times daily with a meal.    Yes Historical Provider, MD  methylPREDNIsolone (MEDROL DOSPACK) 4 MG tablet Take 4 mg by mouth as directed. follow package directions 12/18/13  Yes Curt Bears, MD  morphine (MS CONTIN) 15 MG 12 hr tablet Take 1 tablet (15 mg total) by mouth every 12 (twelve) hours. 12/15/13  Yes Virgel Manifold, MD  Multiple Vitamin (MULTIVITAMIN WITH MINERALS) TABS tablet Take 1 tablet by mouth daily. 08/11/13  Yes Shanker Kristeen Mans, MD  niacin 500 MG tablet Take 500 mg by mouth daily.   Yes Historical Provider, MD  Omega-3 Fatty Acids (FISH OIL) 300 MG CAPS Take 1 capsule by mouth daily.   Yes Historical Provider, MD  oxybutynin (DITROPAN-XL) 10 MG 24 hr tablet Take 10 mg by mouth daily.   Yes Historical Provider, MD  oxyCODONE-acetaminophen (PERCOCET) 7.5-325 MG per tablet Take 1-2 tablets by mouth every 4 (four) hours as needed for pain. 12/15/13  Yes Virgel Manifold, MD  PARoxetine (PAXIL) 30 MG tablet Take 30 mg by mouth daily.   Yes Historical Provider, MD  predniSONE (DELTASONE) 10 MG tablet Take 10 mg by mouth daily with breakfast.  08/29/13  Yes Historical Provider, MD  prochlorperazine (COMPAZINE) 10 MG tablet Take 1 tablet (10 mg total) by mouth every 6 (six) hours as needed for nausea or vomiting. 11/18/13  Yes Curt Bears, MD  rosuvastatin (CRESTOR) 10 MG tablet Take 10 mg by mouth daily.   Yes Historical Provider, MD  SPIRIVA HANDIHALER 18 MCG inhalation capsule Place 18 mcg into inhaler and inhale daily.  08/26/13  Yes Historical Provider, MD  vitamin E 400 UNIT capsule Take 400 Units by mouth daily.   Yes Historical Provider, MD   BP 125/57  Pulse 104  Temp(Src) 98.2 F (36.8 C) (Oral)  Resp 21  SpO2 93% Physical Exam  Nursing note and vitals reviewed. Constitutional: She appears well-developed. She appears distressed.  HENT:  Head: Normocephalic and atraumatic.  Eyes: Conjunctivae are normal. Right eye exhibits no discharge. Left eye exhibits no  discharge.  Neck: Neck supple.  Cardiovascular: Regular rhythm and normal heart sounds.  Exam reveals no gallop and no friction rub.   No murmur heard. tachycardic  Pulmonary/Chest: She is in respiratory distress. She has wheezes. She has rales.  Abdominal: Soft. She exhibits no distension. There is no tenderness.  Musculoskeletal: She exhibits no edema and no tenderness.  Neurological:  Drowsy, confused. Will open eyes to voice. Verbalized, but I am having difficulty understanding her. Repeatedly trying to take off oxygen. Moves all extremities.   Skin: Skin is warm and dry.    ED Course  Procedures (  including critical care time) Labs Review Labs Reviewed  CBC WITH DIFFERENTIAL - Abnormal; Notable for the following:    RBC 2.63 (*)    Hemoglobin 7.8 (*)    HCT 24.9 (*)    RDW 19.4 (*)    Platelets 55 (*)    Neutrophils Relative % 36 (*)    Band Neutrophils 22 (*)    nRBC 35 (*)    All other components within normal limits  PRO B NATRIURETIC PEPTIDE - Abnormal; Notable for the following:    Pro B Natriuretic peptide (BNP) 10986.0 (*)    All other components within normal limits  BASIC METABOLIC PANEL - Abnormal; Notable for the following:    Sodium 136 (*)    Chloride 91 (*)    BUN 53 (*)    Calcium 7.7 (*)    All other components within normal limits  BLOOD GAS, ARTERIAL - Abnormal; Notable for the following:    pO2, Arterial 78.8 (*)    Bicarbonate 24.3 (*)    All other components within normal limits  TROPONIN I    Imaging Review Mr Kizzie Fantasia Contrast  12/12/2013   CLINICAL DATA:  Metastatic small-cell lung cancer. Headaches. New onset weakness.  EXAM: MRI HEAD WITHOUT AND WITH CONTRAST  TECHNIQUE: Multiplanar, multiecho pulse sequences of the brain and surrounding structures were obtained without and with intravenous contrast.  CONTRAST:  40mL MULTIHANCE GADOBENATE DIMEGLUMINE 529 MG/ML IV SOLN  COMPARISON:  09/03/2013  FINDINGS: Bone marrow signal in the skull and  the visualized upper cervical spine appears diffusely diminished compared to the prior study. There is no acute infarct. There is no evidence of intracranial hemorrhage, mass, midline shift, or extra-axial fluid collection. Several small foci of T2 hyperintensity in the cerebral white matter are unchanged and nonspecific, compatible with mild chronic small vessel ischemic disease. There is no abnormal brain parenchymal or meningeal enhancement.  Prior right cataract extraction is noted. Trace right mastoid fluid is present. Major intracranial vascular flow voids are preserved.  IMPRESSION: 1. No evidence of acute intracranial abnormality or parenchymal or meningeal brain metastases. 2. Abnormal bone marrow signal in the skull and visualized cervical spine, likely reflecting underlying widespread osseous metastases.   Electronically Signed   By: Logan Bores   On: 12/30/2013 08:22     EKG Interpretation   Date/Time:  Saturday December 20 2013 11:30:28 EDT Ventricular Rate:  104 PR Interval:  114 QRS Duration: 131 QT Interval:  376 QTC Calculation: 495 R Axis:   107 Text Interpretation:  Sinus tachycardia Nonspecific intraventricular  conduction delay Probable anterolateral infarct, old ED PHYSICIAN  INTERPRETATION AVAILABLE IN [facility] Confirmed by TEST, Record  ([id number]) on 12/22/2013 12:21:24 PM      MDM   Final diagnoses:  CAP (community acquired pneumonia)  Acute respiratory failure with hypoxia  Chronic respiratory failure  Heart failure  Anemia  Thrombocytopenia  Small cell lung carcinoma    57yF with dyspnea/hypoxia. Diffuse rhonchi similar to previous exam. LE edema is significantly worse. Pt drowsy and has to be reminded not to pull on face mask. Suspect encephalopathy related to hypoxemia. Recent pain medication increase. Pupils are not constricted though and she is tachypneic. Will place on BiPAP. Lasix. Needs admission. Addressed code status with son. FULL CODE as were  previous wishes. Doesn't seem like this has been readdressed by pt in light of cancer diagnosis though.    Virgel Manifold, MD 12/25/13 1011

## 2013-12-20 NOTE — ED Notes (Addendum)
Pt in from home by family. Pt having increased work of breathing, shob at baseline. Worse over last few days, lethargy as well. Pt agitated, pulling at oxygen, will not keep it on at home. Pitting edema in BLE. Has been taking lasix but no good improvement. 77% 4.5L oxygen on arrival. Placed on NRB. 97%.

## 2013-12-20 NOTE — H&P (Addendum)
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 Pt is not a good historian and history obtianed mostly from family at the bedside. In ED, pt found to be hypoxic with oxygen saturation of 92% on BiPAP. HR is 127 and RR 28, T max 100.8 F. CXR showed prominent right upper mediastinum and right hilar region adenopathy. Hazy airspace is right midlung suspicious for postobstructive pneumonia or asymmetric edema. She was started on azithromycin and rocephin in ED. Throughout the ED stay she was agitated but responded to ativan. Since she required BiPAP she was admitted to SDU for further observation and management. MRI brain did not reveal acute intracranial findings but what was seen is likely widespread osseous skull metastases. Blood work revealed hemoglobin of 7.8, platelet count of 55, noraml cardiac enzyme and normal renal function.   Assessment and Plan:   Principal Problem:  Acute respiratory failure with hypoxia  - likely due to combination of postobstructive pneumonia, pulmonary fibrosis, lung ca, acute decompensated diastolic CHF - she is requiring BiPAP to mannitan oxygen sat above 90% - continue azithro and rocephin for pneumonia; follow up blood culture results, strep pneumonia, legionella - order placed for bronchodilator treatment  - order placed for solumedrol 60 mg IV Q 12 hours - PCCM may need to be involved should her clinical state worsen   Active Problems:  Acute decompensated diastolic CHF - BNP in 39,767 range on this admission. Pt  was given lasix 40 mg IV in ED and this was ordered as a daily dose - obtain 2 D ECHO - strict intake and output, daily weight - replete electrolytes as needed. Pulmonary fibrosis  - management with oxygen, steroids, and BD  COPD (chronic obstructive pulmonary disease)  - management with BD, steroids and oxygen support  - COPD gold alert ordered and COPD order set In place  Postobstructive pneumonia - pneumonia order set in place  - azithromycin and rocephin ordered  - follow up blood culture results, resp culture, legionella and strep pneumo  Anemia of chronic disease - secondary to history of malignancy - hemoglobin 7.8; will transfuse for hemoglobin less than 7 Thrombocytopenia - platelet count 55; using SCD;s for DVT prophylaxis  Diabetes mellitus without complication  - continue metformin and added sliding scale insulin - pt also take levemir 20 units daily OSA on CPAP  Anxiety and depression  - continue paxil  Overactive bladder  - continue ditropan  DVT prophylaxis: SCD's bilaterally   Radiological Exams on Admission: Mr Kizzie Fantasia Contrast 12/31/2013     IMPRESSION: 1. No evidence of acute intracranial abnormality or parenchymal or meningeal brain metastases. 2. Abnormal bone marrow signal in the skull and visualized cervical spine, likely reflecting underlying widespread osseous metastases.   Dg Chest Portable 1 View 12/20/2013   IMPRESSION: Prominent right upper mediastinum and right hilar region probable due to adenopathy. Hazy airspace is right midlung suspicious for postobstructive pneumonia or asymmetric edema. Hazy bilateral basilar atelectasis or infiltrate.      Code Status: Full Family Communication: Plan of care discussed with the patient  Disposition Plan: Admit for further  evaluation  Leisa Lenz, MD  [facility] Pager [phone]  Review of Systems:  Unable to obtain due to altered mental status Past Medical History  Diagnosis Date  . COPD  (chronic obstructive pulmonary disease)   . Pulmonary fibrosis     diagnosed with this 2001.    . Diabetes mellitus without complication   . Hiatal hernia   . Depression   . Anxiety   . High cholesterol   . Hypertension   . Osteoporosis   . OSA on CPAP     13cm  . Overactive bladder   . Heart murmur   . Cancer     Lung  . Lung cancer 08/18/13    right supraclavicular   Past Surgical History  Procedure Laterality Date  . Bladder suspension    . Abdominal hysterectomy      severe bleeding-when she was in her 30's  . Lung biopsy     Social History:  reports that she has been smoking Cigarettes.  She started smoking about 50 years ago. She has a 31.5 pack-year smoking history. She has never used smokeless tobacco. She reports that she does not drink alcohol or use illicit drugs.  No Known Allergies  Family History:  Family History  Problem Relation Age of Onset  . Pulmonary fibrosis Brother      Prior to Admission medications   Medication Sig Start Date End Date Taking? Authorizing Provider  albuterol (PROVENTIL) (2.5 MG/3ML) 0.083% nebulizer solution Take 3 mLs (2.5 mg total) by nebulization every 2 (two) hours as needed for wheezing or shortness of breath. 08/11/13  Yes Shanker Kristeen Mans, MD  azaTHIOprine (IMURAN) 50 MG tablet Take 50 mg by mouth 2 (two) times daily.   Yes Historical Provider, MD  calcium citrate-vitamin D (CITRACAL+D) 315-200 MG-UNIT per tablet Take 1 tablet by mouth daily.   Yes Historical Provider, MD  Cimetidine (TAGAMET PO) Take 1 tablet by mouth daily.   Yes Historical Provider, MD  cyanocobalamin 1000 MCG tablet Take 100 mcg by mouth daily.   Yes Historical Provider, MD  docusate sodium (COLACE) 100 MG capsule Take 1 capsule (100 mg total) by mouth 2 (two) times daily. 12/15/13  Yes Virgel Manifold, MD  esomeprazole (NEXIUM) 40 MG capsule Take 40 mg by mouth daily before breakfast.   Yes Historical Provider, MD  Fluticasone-Salmeterol (ADVAIR DISKUS) 250-50  MCG/DOSE AEPB Inhale 1 puff into the lungs 2 (two) times daily. 10/07/13  Yes Collene Gobble, MD  folic acid (FOLVITE) 1 MG tablet Take 1 mg by mouth 2 (two) times daily.   Yes Historical Provider, MD  furosemide (LASIX) 20 MG tablet Take 20 mg by mouth daily. 12/18/13  Yes Curt Bears, MD  guaiFENesin (MUCINEX) 600 MG 12 hr tablet Take 600 mg by mouth 2 (two) times daily.   Yes Historical Provider, MD  Insulin Detemir (LEVEMIR FLEXPEN [place]) Inject 20 Units into the skin daily.   Yes Historical Provider, MD  IRON PO Take 1 tablet by mouth 4 (four) times daily.   Yes Historical Provider, MD  LORazepam (ATIVAN) 1 MG tablet Take 1 mg by mouth at bedtime.   Yes Historical Provider, MD  metFORMIN (GLUCOPHAGE) 500 MG tablet Take 1,000 mg by mouth 2 (two) times daily with a meal.    Yes Historical Provider, MD  methylPREDNIsolone (MEDROL DOSPACK) 4 MG tablet Take 4 mg by mouth as directed. follow package directions 12/18/13  Yes Curt Bears, MD  morphine (MS CONTIN) 15 MG 12  hr tablet Take 1 tablet (15 mg total) by mouth every 12 (twelve) hours. 12/15/13  Yes Virgel Manifold, MD  Multiple Vitamin (MULTIVITAMIN WITH MINERALS) TABS tablet Take 1 tablet by mouth daily. 08/11/13  Yes Shanker Kristeen Mans, MD  niacin 500 MG tablet Take 500 mg by mouth daily.   Yes Historical Provider, MD  Omega-3 Fatty Acids (FISH OIL) 300 MG CAPS Take 1 capsule by mouth daily.   Yes Historical Provider, MD  oxybutynin (DITROPAN-XL) 10 MG 24 hr tablet Take 10 mg by mouth daily.   Yes Historical Provider, MD  oxyCODONE-acetaminophen (PERCOCET) 7.5-325 MG per tablet Take 1-2 tablets by mouth every 4 (four) hours as needed for pain. 12/15/13  Yes Virgel Manifold, MD  PARoxetine (PAXIL) 30 MG tablet Take 30 mg by mouth daily.   Yes Historical Provider, MD  predniSONE (DELTASONE) 10 MG tablet Take 10 mg by mouth daily with breakfast.  08/29/13  Yes Historical Provider, MD  prochlorperazine (COMPAZINE) 10 MG tablet Take 1 tablet (10 mg total)  by mouth every 6 (six) hours as needed for nausea or vomiting. 11/18/13  Yes Curt Bears, MD  rosuvastatin (CRESTOR) 10 MG tablet Take 10 mg by mouth daily.   Yes Historical Provider, MD  SPIRIVA HANDIHALER 18 MCG inhalation capsule Place 18 mcg into inhaler and inhale daily.  08/26/13  Yes Historical Provider, MD  vitamin E 400 UNIT capsule Take 400 Units by mouth daily.   Yes Historical Provider, MD   Physical Exam: Filed Vitals:   12/27/2013 1136 12/14/2013 1220 12/26/2013 1245 12/27/2013 1453  BP: 125/57     Pulse: 104  105   Temp: 98.2 F (36.8 C)     TempSrc: Oral     Resp: 21  27 28   SpO2: 93% 96% 98% 100%    Physical Exam  Constitutional: Appears in distress, altered mental status  HENT: Normocephalic. No tonsillar erythema or exudates Eyes: Conjunctivae are normal. PERRLA, no scleral icterus.  Neck: Normal ROM. Neck supple. No JVD. No tracheal deviation. CVS: RRR, S1/S2 appreciated   Pulmonary: diminished breath sounds  Abdominal: Soft. BS +,  no distension, tenderness,  Musculoskeletal: Normal range of motion.  Lymphadenopathy: No lymphadenopathy noted, cervical, inguinal. Neuro: Alert. Normal reflexes, muscle tone coordination. No focal neurologic deficits. Skin: Skin is warm and dry.  Psychiatric: agitated.  Labs on Admission:  Basic Metabolic Panel:  Recent Labs Lab 12/15/13 0837 01/05/2014 1204  NA 139 136*  K 4.1 5.0  CL  --  91*  CO2 19* 19  GLUCOSE 183* 83  BUN 33.7* 53*  CREATININE 0.6 0.58  CALCIUM 9.1 7.7*  MG 2.3  --    Liver Function Tests:  Recent Labs Lab 12/15/13 0837  AST 45*  ALT 27  ALKPHOS 185*  BILITOT 0.39  PROT 6.5  ALBUMIN 3.0*   No results found for this basename: LIPASE, AMYLASE,  in the last 168 hours No results found for this basename: AMMONIA,  in the last 168 hours CBC:  Recent Labs Lab 12/15/13 0837 12/24/2013 1204  WBC 10.8* 5.6  NEUTROABS 9.2* 3.7  HGB 8.6* 7.8*  HCT 27.6* 24.9*  MCV 93.9 94.7  PLT 139* 55*    Cardiac Enzymes:  Recent Labs Lab 12/12/2013 1204  TROPONINI <0.30   BNP: No components found with this basename: POCBNP,  CBG: No results found for this basename: GLUCAP,  in the last 168 hours  If 7PM-7AM, please contact night-coverage [email or web address] Password [facility] 12/28/2013, 2:59 PM

## 2013-12-20 NOTE — ED Notes (Signed)
Okay per Wilson Singer, MD to start abx without blood cultures. Cultures not indicated at this time.

## 2013-12-20 NOTE — ED Notes (Signed)
Bed: WA07 Expected date:  Expected time:  Means of arrival:  Comments: 

## 2013-12-20 NOTE — Progress Notes (Addendum)
Chaplain paged for [place] consult and family support.  Jessica Rollins is a 58 year old patient with end stage lung cancer and end stage pulmonary fibrosis. She was aware as she was being brought to [facility] that she may die quickly once here. She affirmed she knows she is dying. She has given her brother her medical power of attorney with specific instructions about her end-of-life care. She has spoken to her pastor about her funeral arrangements. A funeral home has been chosen and alerted.  Family is comfortable with the choice of palliative care. They are aware of the positives to such care and desire that Jessica Moosman be kept comfortable, as free of pain as possible and enjoy as much quality of life as she can given her present condition. Family reports she was comforted by being placed on a BIPAP to assist with her breathing.  Family is close knit has put the call out to all who need to know that Jessica Troost is in the hospital and may die soon. The are pleased with the care given by the [facility] staff and are appreciative.  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 Her church family in the [place] area is aware of her condition. The pastor of the church has visited and provide end-of-life counsel to Jessica Delval. Even though she lives in [place], she has stated that she feels more comfortable and more cared for in the [place]. Being a patient here has brought her the peace of knowing that the type care she wishes is being given her. Family concurs with this and considers the extra travel incurred is worth the effort. The family has experienced several deaths in the last two years and feel, at times, like grief is being piled on. Jessica Shrider was not able to speak but when I asked her if she was at peace with what was happening to her she shook her head in the affirmative. Spiritual Care to both Jessica Clingan and her family is appreciated and requested.    SPIRITUAL GOALS OF CARE: 1.  Support Jessica Nachtigal' wishes for a safe and comforting atmosphere of care 2.  Coordinate with and work with Jessica Witting' community of faith leaders as needed 3.  Provide grief support and spiritual care to family members, especially in light of the other deaths they are still grieving 4.  Provide, as allowed, spiritual support to the family upon Jessica Adcox' death 10-10-22.  Act as staff spiritual liaison for any matters that can be lifted from the patient or family members 58.  Address and provide professional care to any spiritual/emotional/exitential pain that may arise.  Spiritual Care follow ups are indicated as important to this patient and her family.  Sallee Lange. Danaiya Steadman, [place]

## 2013-12-21 DIAGNOSIS — C7951 Secondary malignant neoplasm of bone: Secondary | ICD-10-CM | POA: Insufficient documentation

## 2013-12-21 DIAGNOSIS — C7952 Secondary malignant neoplasm of bone marrow: Secondary | ICD-10-CM

## 2013-12-21 DIAGNOSIS — J449 Chronic obstructive pulmonary disease, unspecified: Secondary | ICD-10-CM

## 2013-12-21 DIAGNOSIS — J441 Chronic obstructive pulmonary disease with (acute) exacerbation: Secondary | ICD-10-CM

## 2013-12-21 DIAGNOSIS — D638 Anemia in other chronic diseases classified elsewhere: Secondary | ICD-10-CM

## 2013-12-21 DIAGNOSIS — Z515 Encounter for palliative care: Secondary | ICD-10-CM

## 2013-12-21 DIAGNOSIS — F341 Dysthymic disorder: Secondary | ICD-10-CM

## 2013-12-21 LAB — LEGIONELLA ANTIGEN, URINE: Legionella Antigen, Urine: NEGATIVE

## 2013-12-21 LAB — CBC
HEMATOCRIT: 22.4 % — AB (ref 36.0–46.0)
HEMOGLOBIN: 7.3 g/dL — AB (ref 12.0–15.0)
MCH: 29.6 pg (ref 26.0–34.0)
MCHC: 32.6 g/dL (ref 30.0–36.0)
MCV: 90.7 fL (ref 78.0–100.0)
Platelets: 32 10*3/uL — ABNORMAL LOW (ref 150–400)
RBC: 2.47 MIL/uL — AB (ref 3.87–5.11)
RDW: 19.4 % — ABNORMAL HIGH (ref 11.5–15.5)
WBC: 3.5 10*3/uL — AB (ref 4.0–10.5)

## 2013-12-21 LAB — COMPREHENSIVE METABOLIC PANEL
ALBUMIN: 2.3 g/dL — AB (ref 3.5–5.2)
ALT: 102 U/L — ABNORMAL HIGH (ref 0–35)
AST: 118 U/L — AB (ref 0–37)
Alkaline Phosphatase: 232 U/L — ABNORMAL HIGH (ref 39–117)
BUN: 29 mg/dL — AB (ref 6–23)
CALCIUM: 7 mg/dL — AB (ref 8.4–10.5)
CO2: 28 mEq/L (ref 19–32)
CREATININE: 0.36 mg/dL — AB (ref 0.50–1.10)
Chloride: 95 mEq/L — ABNORMAL LOW (ref 96–112)
GFR calc Af Amer: >90 mL/min (ref >90)
GFR calc non Af Amer: >90 mL/min (ref >90)
Glucose, Bld: 99 mg/dL (ref 70–99)
Potassium: 3.4 mEq/L — ABNORMAL LOW (ref 3.7–5.3)
Sodium: 140 mEq/L (ref 137–147)
TOTAL PROTEIN: 6.5 g/dL (ref 6.0–8.3)
Total Bilirubin: 1.9 mg/dL — ABNORMAL HIGH (ref 0.3–1.2)

## 2013-12-21 MED ORDER — FUROSEMIDE 10 MG/ML IJ SOLN: 40.0000 mg | Freq: Two times a day (BID) | INTRAMUSCULAR | Status: DC

## 2013-12-21 MED ORDER — LORAZEPAM 2 MG/ML IJ SOLN
0.5000 mg | Freq: Four times a day (QID) | INTRAMUSCULAR | Status: DC | PRN
  Administered 2013-12-21: 0.5 mg via INTRAVENOUS
  Filled 2013-12-21: qty 1

## 2013-12-21 MED ORDER — SCOPOLAMINE 1 MG/3DAYS TD PT72
1.0000 | MEDICATED_PATCH | TRANSDERMAL | Status: DC
  Administered 2013-12-21: 1.5 mg via TRANSDERMAL
  Filled 2013-12-21: qty 1

## 2013-12-21 MED ORDER — LORAZEPAM 2 MG/ML IJ SOLN
1.0000 mg | INTRAMUSCULAR | Status: DC | PRN
  Administered 2013-12-21: 1 mg via INTRAVENOUS
  Filled 2013-12-21: qty 1

## 2013-12-21 MED ORDER — POTASSIUM CHLORIDE 10 MEQ/100ML IV SOLN
10.0000 meq | INTRAVENOUS | Status: DC
Start: 2013-12-21 — End: 2013-12-21

## 2013-12-21 MED ORDER — ACETAMINOPHEN 650 MG RE SUPP: 650.0000 mg | RECTAL | Status: DC | PRN

## 2013-12-21 MED ORDER — HALOPERIDOL LACTATE 5 MG/ML IJ SOLN: 2.0000 mg | Freq: Four times a day (QID) | INTRAMUSCULAR | Status: DC | PRN

## 2013-12-21 MED ORDER — MORPHINE BOLUS VIA INFUSION
2.0000 mg | INTRAVENOUS | Status: DC | PRN
Start: 2013-12-21 — End: 2013-12-21
  Administered 2013-12-21: 2 mg via INTRAVENOUS
  Filled 2013-12-21: qty 2

## 2013-12-21 MED ORDER — MORPHINE SULFATE 10 MG/ML IJ SOLN
7.0000 mg/h | INTRAVENOUS | Status: DC
  Administered 2013-12-21: 10 mg/h via INTRAVENOUS
  Filled 2013-12-21: qty 10

## 2013-12-21 MED ORDER — ATROPINE SULFATE 1 % OP SOLN
4.0000 [drp] | OPHTHALMIC | Status: DC | PRN
  Administered 2013-12-21: 4 [drp] via SUBLINGUAL
  Filled 2013-12-21: qty 2

## 2013-12-21 MED ORDER — MORPHINE SULFATE 10 MG/ML IJ SOLN
10.0000 mg/h | INTRAMUSCULAR | Status: DC
  Administered 2013-12-21: 10 mg/h via INTRAVENOUS
  Filled 2013-12-21: qty 10

## 2013-12-21 MED ORDER — MORPHINE BOLUS VIA INFUSION
5.0000 mg | INTRAVENOUS | Status: DC | PRN
  Filled 2013-12-21: qty 20

## 2013-12-21 MED ORDER — LORAZEPAM 2 MG/ML IJ SOLN
1.0000 mg | Freq: Four times a day (QID) | INTRAMUSCULAR | Status: DC | PRN
  Administered 2013-12-21: 1 mg via INTRAVENOUS
  Filled 2013-12-21: qty 1

## 2013-12-22 ENCOUNTER — Ambulatory Visit: Payer: Medicare Other | Admitting: Radiation Oncology

## 2013-12-22 ENCOUNTER — Other Ambulatory Visit: Payer: Medicare Other

## 2013-12-22 ENCOUNTER — Ambulatory Visit: Payer: Medicare Other

## 2013-12-22 LAB — URINE CULTURE
COLONY COUNT: NO GROWTH
CULTURE: NO GROWTH

## 2013-12-22 LAB — GLUCOSE, CAPILLARY: Glucose-Capillary: 106 mg/dL — ABNORMAL HIGH (ref 70–99)

## 2013-12-23 ENCOUNTER — Ambulatory Visit: Payer: Medicare Other

## 2013-12-24 ENCOUNTER — Ambulatory Visit: Payer: Medicare Other

## 2013-12-25 ENCOUNTER — Ambulatory Visit: Payer: Medicare Other

## 2013-12-26 ENCOUNTER — Ambulatory Visit: Payer: Medicare Other

## 2013-12-29 ENCOUNTER — Other Ambulatory Visit: Payer: Medicare Other

## 2013-12-29 ENCOUNTER — Ambulatory Visit: Payer: Medicare Other

## 2013-12-30 ENCOUNTER — Ambulatory Visit: Payer: Medicare Other

## 2013-12-31 ENCOUNTER — Ambulatory Visit: Payer: Medicare Other

## 2014-01-01 ENCOUNTER — Ambulatory Visit: Payer: Medicare Other

## 2014-01-02 ENCOUNTER — Ambulatory Visit: Payer: Medicare Other

## 2014-01-05 ENCOUNTER — Ambulatory Visit: Payer: Medicare Other

## 2014-01-05 ENCOUNTER — Ambulatory Visit: Payer: Medicare Other | Admitting: Internal Medicine

## 2014-01-05 ENCOUNTER — Other Ambulatory Visit: Payer: Medicare Other

## 2014-01-06 ENCOUNTER — Ambulatory Visit: Payer: Medicare Other

## 2014-01-07 ENCOUNTER — Ambulatory Visit: Payer: Medicare Other

## 2014-01-07 NOTE — Progress Notes (Signed)
  Radiation Oncology         ([phone]) [phone] ________________________________  Name: Jessica Rollins MRN: [id number]  Date: 12/19/2013  DOB: Jun 22, 1956  SIMULATION AND TREATMENT PLANNING NOTE  DIAGNOSIS:  Extensive stage small cell lung cancer  Site:  1.  Mediastinum           2.  Lumbosacral region  NARRATIVE:  The patient was brought to the [place].  Identity was confirmed.  All relevant records and images related to the planned course of therapy were reviewed.   Written consent to proceed with treatment was confirmed which was freely given after reviewing the details related to the planned course of therapy had been reviewed with the patient.  Then, the patient was set-up in a stable reproducible  supine position for radiation therapy.  CT images were obtained.  Surface markings were placed.    Medically necessary complex treatment device(s) for immobilization:  Customized VAC lock bag.   The CT images were loaded into the planning software.  Then the target and avoidance structures were contoured.  Treatment planning then occurred.  The radiation prescription was entered and confirmed.  A total of 9 complex treatment devices were fabricated which relate to the designed radiation treatment fields: The central chest will be treated with a three-field technique and the lumbosacral region will be treated with a half beam block technique using an additional 6 customized fields. This is necessary to reduce the toxicity especially with regards to bowel given the large treatment area. Each of these customized fields/ complex treatment devices will be used on a daily basis during the radiation course. I have requested : 3D Simulation  I have requested a DVH of the following structures: Target, lungs, spinal cord, kidneys.   PLAN:  The patient will receive 37.5 Gy in 15 fractions to both target area.  ________________________________   Jodelle Gross, MD, PhD

## 2014-01-07 NOTE — Progress Notes (Signed)
Extensive discussion with family son, mother. We discussed the poor prognosis and likely poor quality of life. Family has decided to offer full comfort care. Will call dr Lovena Le to come back to discuss this process further. Have ordered morphine for now.  Jessica Rollins. Titus Mould, MD, [place] Pgr: [facility]\

## 2014-01-07 NOTE — Clinical Documentation Improvement (Deleted)
Please clarify the type of tachycardia:   Paroxysmal   Reentrant ventricular tachycardia   Sinus   Supraventricular tachycardia (atrial, atrioventricular, atrioventricular re-entrant, junctional, nodal)   Ventricular tachycardia   Other (please specify type) ______________________   Unable to determine   Unknown  Risk Factors: Sign & Symptoms: Diagnostics: Treatment:  Thank you, Heloise Beecham ,RN Clinical Documentation Specialist:  [facility]

## 2014-01-07 NOTE — Progress Notes (Signed)
Wasted 40 ml of MS from 167ml infusion. Wasted in sink, witnessed by Barnetta Chapel RN

## 2014-01-07 NOTE — Consult Note (Signed)
Patient [name]      DOB: 01/25/1956      ERD:[id number]  "I don't know what there is to talk about.". Patient's mom breaks down and cries as I enter.  Family discussed goals of care as full comfort prior to my arrival with Dr. Titus Mould.  Had stopped to schedule time with son this morning at 77 am.  Son had expressed they were wanting to use the bipap to give the medications time. He stated she used cpap at home so the mask was not that big a deal for her until she wants it off and doesn't understand/ isn't alert enough to know that she can't take it off without dropping sats into 80's.  Family had agreed to re meet outside the room which was their preference at noon. Subsequently,  during pulmonary rounds family decided to move forward with "full comfort".  I returned to check on them after receiving call from Dr. Titus Mould.   Son expresses they are satisfied with current level of comfort.  Patient on Ventimask with morphine ordered at 10 mg/hr.  Drip actually at 7 mg/hr.  Family just wants to be with her.  No need for further conversation.  Hugs exchanged and I reviewed adjustments that will be made to assure patient's continued comfort level.  Son Angelica Chessman share with me that "she was the strongest person he has ever known", when asked what one thing I should know about her.      Patient with decreased work of breathing, obtunded, no acute distress at present.  Recommend:  1.  DNR  2.  Dyspnea/Pain: drip currently at 7 mg/hr.  Will adjust bolus to 2 mg/15 min with order to increase basilar rate by 1 mg if 3 boluses used in 60 min period. Max dosing 10 mg/hr which tends to be where we see neurologic complications.  If patient requires 10 mg/hr or more would recommend changing to dilaudid at 1 mg/hr with boluses of 1 mg / 15 min prn.  May increase basilar rate by 1 mg/hr if 3 boluses used in 60 min period.  3.  Terminal Secretions: add scopolamine and atropine prn .   4.  Abx were  discontinued by CCM, add tylenol suppository for fever.  5.  Chaplain engaged.  Total time 1200-1220 and 725 am-745 am   Jessica Grennan L. Lovena Le, MD MBA The Palliative Medicine Team at [facility] Phone: [phone] Pager: [phone]

## 2014-01-07 NOTE — Progress Notes (Signed)
Radiation Oncology         [phone]) [phone] ________________________________  Name: Jessica Rollins MRN: [id number]  Date: 12/17/2013  DOB: [birth year]  JF:[id number], Leafy Kindle, MD  Curt Bears, MD     REFERRING PHYSICIAN: Curt Bears, MD   DIAGNOSIS: The primary encounter diagnosis was Small cell lung carcinoma. Diagnoses of Chronic respiratory failure, Malignant neoplasm of thorax, and Secondary malignant neoplasm of bone and bone marrow were also pertinent to this visit.   HISTORY OF PRESENT ILLNESS::Jessica Rollins is a 58 y.o. female who is seen for an initial consultation visit. The patient is seen today regarding her diagnosis of extensive stage small cell lung cancer. She was diagnosed with this in February of 2015. She has proceeded with systemic treatment with Dr. Julien Nordmann. She is status post 4 cycles of carboplatin and etoposide. She tolerated this treatment early well.  The patient has recently undergone restaging studies with a CT scan of the chest abdomen and pelvis. Unfortunately this showed some progression of disease. There was progression of mediastinal, right hilar, and left AP window lymphadenopathy compatible with intrathoracic metastatic disease. Notably, in numerable sclerotic osseous metastases were really identified. Because of this finding, the patient is beginning second line chemotherapy consisting of cisplatin and a recent CT scan. The patient does wish to remain aggressive with her treatment at this time.  The patient has been also complaining of some back pain. Today, the patient indicates that she also has begun to have some difficulty swallowing as well. She is able to continue eating soft foods and liquids but the patient's son in particular is concerned about her grandson within the chest which is affecting this issue as well. Given these findings I have been asked to see the patient today for consideration of palliative radiation treatment.   PREVIOUS RADIATION  THERAPY: No   PAST MEDICAL HISTORY:  has a past medical history of COPD (chronic obstructive pulmonary disease); Pulmonary fibrosis; Diabetes mellitus without complication; Hiatal hernia; Depression; Anxiety; High cholesterol; Hypertension; Osteoporosis; OSA on CPAP; Overactive bladder; Heart murmur; Cancer; and Lung cancer (08/18/13).     PAST SURGICAL HISTORY: Past Surgical History  Procedure Laterality Date  . Bladder suspension    . Abdominal hysterectomy      severe bleeding-when she was in her 30's  . Lung biopsy       FAMILY HISTORY: family history includes Pulmonary fibrosis in her brother.   SOCIAL HISTORY:  reports that she has been smoking Cigarettes.  She started smoking about 50 years ago. She has a 31.5 pack-year smoking history. She has never used smokeless tobacco. She reports that she does not drink alcohol or use illicit drugs.   ALLERGIES: Review of patient's allergies indicates no known allergies.   MEDICATIONS:  No current facility-administered medications for this encounter.   No current outpatient prescriptions on file.   Facility-Administered Medications Ordered in Other Encounters  Medication Dose Route Frequency Provider Last Rate Last Dose  . 0.9 %  sodium chloride infusion   Intravenous Continuous Robbie Lis, MD 10 mL/hr at 01/02/2014 2139    . morphine 1 mg/mL in dextrose 5 % 100 mL infusion  10 mg/hr Intravenous Continuous Robbie Lis, MD 10 mL/hr at Jan 14, 2014 0915 10 mg/hr at 01/14/14 0915  . morphine bolus via infusion 5-20 mg  5-20 mg Intravenous Q15 min PRN Raylene Miyamoto, MD         REVIEW OF SYSTEMS:  A 15 point review of systems is documented in  the electronic medical record. This was obtained by the nursing staff. However, I reviewed this with the patient to discuss relevant findings and make appropriate changes.  Pertinent items are noted in HPI.    PHYSICAL EXAM:  height is 5\' 3"  (1.6 m) and weight is 168 lb 4.8 oz (76.34 kg). Her  oral temperature is 98.3 F (36.8 C). Her blood pressure is 123/41 and her pulse is 103. Her respiration is 22 and oxygen saturation is 94%.   ECOG = 2  0 - Asymptomatic (Fully active, able to carry on all predisease activities without restriction)  1 - Symptomatic but completely ambulatory (Restricted in physically strenuous activity but ambulatory and able to carry out work of a light or sedentary nature. For example, light housework, office work)  2 - Symptomatic, <50% in bed during the day (Ambulatory and capable of all self care but unable to carry out any work activities. Up and about more than 50% of waking hours)  3 - Symptomatic, >50% in bed, but not bedbound (Capable of only limited self-care, confined to bed or chair 50% or more of waking hours)  4 - Bedbound (Completely disabled. Cannot carry on any self-care. Totally confined to bed or chair)  5 - Death   Eustace Pen MM, Creech RH, Tormey DC, et al. [id number]). "Toxicity and response criteria of the [facility]". [place] Oncol. 5 (6): 649-55  General: Well-developed, in no acute distress HEENT: Normocephalic, atraumatic Cardiovascular: Regular rate and rhythm Respiratory: Clear to auscultation bilaterally GI: Soft, nontender, normal bowel sounds Extremities: No edema present, the patient complains of pain in the lumbosacral region centered in the upper sacrum. No real tenderness in this area on palpation.    LABORATORY DATA:  Lab Results  Component Value Date   WBC 3.5* 12/31/2013   HGB 7.3* 31-Dec-2013   HCT 22.4* 31-Dec-2013   MCV 90.7 12/31/13   PLT 32* December 31, 2013   Lab Results  Component Value Date   NA 140 12-31-13   K 3.4* 31-Dec-2013   CL 95* Dec 31, 2013   CO2 28 12-31-13   Lab Results  Component Value Date   ALT 102* 12-31-2013   AST 118* 12/31/2013   ALKPHOS 232* 12/31/2013   BILITOT 1.9* 2013/12/31      RADIOGRAPHY: Ct Chest W Contrast  12/08/2013   CLINICAL DATA:  Lung cancer diagnosed  2015. Ongoing chemotherapy. Shortness of breath and cough.  EXAM: CT CHEST AND ABDOMEN WITH CONTRAST  TECHNIQUE: Multidetector CT imaging of the chest and abdomen was performed following the standard protocol during bolus administration of intravenous contrast.  CONTRAST:  142mL OMNIPAQUE IOHEXOL 300 MG/ML  SOLN  COMPARISON:  Chest CT 08/10/2013, 10/17/2013  FINDINGS: CT CHEST FINDINGS  Confluent centrally-necrotic dominant paratracheal lymphadenopathy has increased, 5.1 x 3.5 cm image 22 compared to 3.8 x 2.7 cm at the same anatomic level previously.  Increased AP window lymphadenopathy, 1.2 cm image 25, previously 0.9 cm.  Increased right perihilar lymphadenopathy, 1.6 cm image 26 previously 0.9 cm. No appreciable narrowing of the right mainstem bronchus by adjacent lymphadenopathy. Central airways are patent.  Spiculated 0.8 cm right upper lobe pulmonary nodule image 29 is stable. Irregular superior segment right lower lobe pulmonary nodule is stable, 1.2 cm image 40. Patchy areas of subpleural reticular opacity are reidentified with central bronchial wall thickening. Emphysematous changes are again noted. Increased basal right upper lobe volume loss. No pleural effusion or pneumothorax.  CT ABDOMEN FINDINGS  Innumerable sclerotic osseous metastases  reidentified. Bilateral subacute and remote rib deformities are reidentified. No acute osseous abnormality is identified.  New and enlarging hepatic metastases are identified, representative posterior segment right hepatic lobe mass now 3.2 cm image 62, previously 0.9 cm. Confluent lateral segment left hepatic lobe masses measure 6.0 x 3.2 cm image 59, previously 3.1 x 1.5 cm at the same anatomic level. Possible dependent gallstone, sludge, or polyp versus volume averaging artifact in the gallbladder image 72. A stone is favored as this finding was seen on the prior exam as well. No other CT evidence for acute cholecystitis. Metastasis is felt less likely but may  occur on the gallbladder. Adrenal glands, spleen, kidneys, and pancreas are normal. No visualized bowel wall thickening or focal segmental dilatation. Minimal nodularity subjectively of the right adrenal gland anterior limb image 65 is stable without measurable mass. Moderate atheromatous aortic calcification without aneurysm.  IMPRESSION: Progression of mediastinal, right hilar, and left AP window lymphadenopathy compatible with intrathoracic metastatic disease.  Stable right-sided pulmonary nodules.  Progression of hepatic metastatic disease.  Innumerable sclerotic osseous metastasis reidentified.  Probable gallstone without other CT evidence for acute cholecystitis.   Electronically Signed   By: Conchita Paris M.D.   On: 12/08/2013 11:59   Ct Abdomen W Contrast  12/08/2013   CLINICAL DATA:  Lung cancer diagnosed 2015. Ongoing chemotherapy. Shortness of breath and cough.  EXAM: CT CHEST AND ABDOMEN WITH CONTRAST  TECHNIQUE: Multidetector CT imaging of the chest and abdomen was performed following the standard protocol during bolus administration of intravenous contrast.  CONTRAST:  159mL OMNIPAQUE IOHEXOL 300 MG/ML  SOLN  COMPARISON:  Chest CT 08/10/2013, 10/17/2013  FINDINGS: CT CHEST FINDINGS  Confluent centrally-necrotic dominant paratracheal lymphadenopathy has increased, 5.1 x 3.5 cm image 22 compared to 3.8 x 2.7 cm at the same anatomic level previously.  Increased AP window lymphadenopathy, 1.2 cm image 25, previously 0.9 cm.  Increased right perihilar lymphadenopathy, 1.6 cm image 26 previously 0.9 cm. No appreciable narrowing of the right mainstem bronchus by adjacent lymphadenopathy. Central airways are patent.  Spiculated 0.8 cm right upper lobe pulmonary nodule image 29 is stable. Irregular superior segment right lower lobe pulmonary nodule is stable, 1.2 cm image 40. Patchy areas of subpleural reticular opacity are reidentified with central bronchial wall thickening. Emphysematous changes are again  noted. Increased basal right upper lobe volume loss. No pleural effusion or pneumothorax.  CT ABDOMEN FINDINGS  Innumerable sclerotic osseous metastases reidentified. Bilateral subacute and remote rib deformities are reidentified. No acute osseous abnormality is identified.  New and enlarging hepatic metastases are identified, representative posterior segment right hepatic lobe mass now 3.2 cm image 62, previously 0.9 cm. Confluent lateral segment left hepatic lobe masses measure 6.0 x 3.2 cm image 59, previously 3.1 x 1.5 cm at the same anatomic level. Possible dependent gallstone, sludge, or polyp versus volume averaging artifact in the gallbladder image 72. A stone is favored as this finding was seen on the prior exam as well. No other CT evidence for acute cholecystitis. Metastasis is felt less likely but may occur on the gallbladder. Adrenal glands, spleen, kidneys, and pancreas are normal. No visualized bowel wall thickening or focal segmental dilatation. Minimal nodularity subjectively of the right adrenal gland anterior limb image 65 is stable without measurable mass. Moderate atheromatous aortic calcification without aneurysm.  IMPRESSION: Progression of mediastinal, right hilar, and left AP window lymphadenopathy compatible with intrathoracic metastatic disease.  Stable right-sided pulmonary nodules.  Progression of hepatic metastatic disease.  Innumerable sclerotic  osseous metastasis reidentified.  Probable gallstone without other CT evidence for acute cholecystitis.   Electronically Signed   By: Conchita Paris M.D.   On: 12/08/2013 11:59   Mr Jessica Rollins Contrast  01/02/2014   CLINICAL DATA:  Metastatic small-cell lung cancer. Headaches. New onset weakness.  EXAM: MRI HEAD WITHOUT AND WITH CONTRAST  TECHNIQUE: Multiplanar, multiecho pulse sequences of the brain and surrounding structures were obtained without and with intravenous contrast.  CONTRAST:  62mL MULTIHANCE GADOBENATE DIMEGLUMINE 529 MG/ML  IV SOLN  COMPARISON:  09/03/2013  FINDINGS: Bone marrow signal in the skull and the visualized upper cervical spine appears diffusely diminished compared to the prior study. There is no acute infarct. There is no evidence of intracranial hemorrhage, mass, midline shift, or extra-axial fluid collection. Several small foci of T2 hyperintensity in the cerebral white matter are unchanged and nonspecific, compatible with mild chronic small vessel ischemic disease. There is no abnormal brain parenchymal or meningeal enhancement.  Prior right cataract extraction is noted. Trace right mastoid fluid is present. Major intracranial vascular flow voids are preserved.  IMPRESSION: 1. No evidence of acute intracranial abnormality or parenchymal or meningeal brain metastases. 2. Abnormal bone marrow signal in the skull and visualized cervical spine, likely reflecting underlying widespread osseous metastases.   Electronically Signed   By: Logan Bores   On: 12/16/2013 08:22   Dg Chest Portable 1 View  12/10/2013   CLINICAL DATA:  Shortness of breath  EXAM: PORTABLE CHEST - 1 VIEW  COMPARISON:  12/08/2013  FINDINGS: There is prominence of to right upper mediastinum and right hilar region probable due to adenopathy. Cardiac size is stable. Hazy airspace disease right midlung suspicious for postobstructive pneumonia or asymmetric edema. Hazy bilateral basilar atelectasis or infiltrate.  IMPRESSION: Prominent right upper mediastinum and right hilar region probable due to adenopathy. Hazy airspace is right midlung suspicious for postobstructive pneumonia or asymmetric edema. Hazy bilateral basilar atelectasis or infiltrate.   Electronically Signed   By: Lahoma Crocker M.D.   On: 01/02/2014 12:54       IMPRESSION: The patient has extensive stage small cell lung cancer. She is beginning second line chemotherapy. She was originally diagnosed in February of 2015.  I believe that the patient is an appropriate candidate for palliative  radiation treatment to the chest as well as the lumbosacral region to 2 pain at this site. The patient's CT scan really did not demonstrate significant compression of the esophagus to 2 central mediastinal disease, but hopefully treatment to this area will help regarding this symptom to decompress this area.  I discussed with the patient a potential three-week course of palliative radiation which will be given with concurrent chemotherapy. I discussed with the patient the rationale of this treatment as well as the potential side effects and risks of treatment as well. All of their questions were answered. The patient does wish to proceed with this treatment plan at this time.   PLAN: The patient will be scheduled for a simulation in the near future. We will begin her treatment as soon as possible after this given her current symptoms. I anticipate treating the patient with 3 weeks of radiation, again to the mediastinal region as well as the lumbosacral region.      ________________________________   Jodelle Gross, MD, PhD   **Disclaimer: This note was dictated with voice recognition software. Similar sounding words can inadvertently be transcribed and this note may contain transcription errors which may not have been  corrected upon publication of note.**

## 2014-01-07 NOTE — Consult Note (Signed)
Patient Jessica Rollins      DOB: Jan 30, 1956      VXB:[id number]     Consult Note from the Palliative Medicine Team at [facility] Requested by: Dr. Charlies Silvers    PCP: Ramond Dial, MD Reason for Consultation: GOC and symptom management  Phone Number:[phone]  Assessment of patients Current state: 58 yr old white female with history of pulmonary fibrosis, COPD and metastatic small cell lung cancer present s with respiratory failure and worsening cognitive status. She is actively dying.  I met with the patient's family earlier this am .  They have been holding vigil overnight and at that time they were not ready to talk about transition to full comfort off of bipap . See my note from date of service.  Subsequent to my anticipated revisit the family did open up to Dr. Titus Mould and accepted full comfort transition .  I have returned to assist with symptom management at the end of life.   Goals of Care: 1.  Code Status: DNR   2. Scope of Treatment: Comfort care with transition off of bipap.   4. Disposition: Expecting a hospital death.   3. Symptom Management:   1. Anxiety/Agitation: prn ativan can used if needed.  2. Pain/dyspnea: Agree with use of opiate drip will adjust dosing to assist nursing in knowing when and how to titrate.  See my summary note. 3. Fever: PRN tylenol 4. Terminal Secretions: add atropine and scopolamine  4. Psychosocial: offered to talk with family- mom, son, sig other all at bedside.  They state they understand and just want to be with her.  Other family arriving.  5. Spiritual: Spiritual care offered prn        Patient Documents Completed or Given: Document Given Completed  Advanced Directives Pkt    MOST    DNR    Gone from My Sight    Hard Choices      Brief HPI: 58 yr old white female with history of pulmonary fibrosis, COPD, and metastatic small cell lung cancer.  Patient present with worsen mental status, and shortness  of breath.  We were asked to assist with goals of care and symptom management.   ROS: Can not be obtained.    PMH:  Past Medical History  Diagnosis Date  . COPD (chronic obstructive pulmonary disease)   . Pulmonary fibrosis     diagnosed with this 2001.    . Diabetes mellitus without complication   . Hiatal hernia   . Depression   . Anxiety   . High cholesterol   . Hypertension   . Osteoporosis   . OSA on CPAP     13cm  . Overactive bladder   . Heart murmur   . Cancer     Lung  . Lung cancer 08/18/13    right supraclavicular     PSH: Past Surgical History  Procedure Laterality Date  . Bladder suspension    . Abdominal hysterectomy      severe bleeding-when she was in her 30's  . Lung biopsy     I have reviewed the [place] and [facility] and  If appropriate update it with new information. No Known Allergies Scheduled Meds:  Continuous Infusions: . sodium chloride 10 mL/hr at 01/03/2014 2139  . morphine 10 mg/hr (Jan 14, 2014 0915)   PRN Meds:.morphine    BP 124/52  Pulse 90  Temp(Src) 97.1 F (36.2 C) (Axillary)  Resp 17  Ht 5' 2.99" (1.6 m)  Wt 73.8 kg (162 lb 11.2 oz)  BMI 28.83 kg/m2  SpO2 98%   PPS: 10 %   Intake/Output Summary (Last 24 hours) at 2013/12/30 1217 Last data filed at Dec 30, 2013 1100  Gross per 24 hour  Intake  396.5 ml  Output   1550 ml  Net -1153.5 ml    Physical Exam:  General: does not open eyes to tactile and verbal stimuli but does get agitated HEENT: pupils not examined, mmm dry bipap in place Chest:  Decreased with poor air entry, some rhonchi CVS: Tachy, irregular, S1, S2  Abdomen: soft ,no grimace Ext: cool, not mottled, edema 1 Neuro:not purposefully responsive , will stir with tactile and  Verbal stimuli but is more agitated then responsive  Labs: CBC    Component Value Date/Time   WBC 3.5* Dec 30, 2013 0415   WBC 10.8* 12/15/2013 0837   RBC 2.47* 12/30/2013 0415   RBC 2.94* 12/15/2013 0837   HGB 7.3* December 30, 2013 0415   HGB 8.6*  12/15/2013 0837   HCT 22.4* 30-Dec-2013 0415   HCT 27.6* 12/15/2013 0837   PLT 32* 2013/12/30 0415   PLT 139* 12/15/2013 0837   MCV 90.7 12-30-2013 0415   MCV 93.9 12/15/2013 0837   MCH 29.6 2013-12-30 0415   MCH 29.3 12/15/2013 0837   MCHC 32.6 2013/12/30 0415   MCHC 31.2* 12/15/2013 0837   RDW 19.4* 12-30-2013 0415   RDW 19.2* 12/15/2013 0837   LYMPHSABS 1.2 12/19/2013 1204   LYMPHSABS 1.1 12/15/2013 0837   MONOABS 0.4 12/19/2013 1204   MONOABS 0.3 12/15/2013 0837   EOSABS 0.3 01/06/2014 1204   EOSABS 0.0 12/15/2013 0837   BASOSABS 0.0 12/27/2013 1204   BASOSABS 0.1 12/15/2013 0837      CMP     Component Value Date/Time   NA 140 2013-12-30 0415   NA 139 12/15/2013 0837   K 3.4* 12/30/13 0415   K 4.1 12/15/2013 0837   CL 95* 12-30-13 0415   CO2 28 30-Dec-2013 0415   CO2 19* 12/15/2013 0837   GLUCOSE 99 12/30/13 0415   GLUCOSE 183* 12/15/2013 0837   BUN 29* 12/30/13 0415   BUN 33.7* 12/15/2013 0837   CREATININE 0.36* 12/30/2013 0415   CREATININE 0.6 12/15/2013 0837   CALCIUM 7.0* 30-Dec-2013 0415   CALCIUM 9.1 12/15/2013 0837   PROT 6.5 2013/12/30 0415   PROT 6.5 12/15/2013 0837   ALBUMIN 2.3* Dec 30, 2013 0415   ALBUMIN 3.0* 12/15/2013 0837   AST 118* 12/30/13 0415   AST 45* 12/15/2013 0837   ALT 102* 2013-12-30 0415   ALT 27 12/15/2013 0837   ALKPHOS 232* 12/30/2013 0415   ALKPHOS 185* 12/15/2013 0837   BILITOT 1.9* Dec 30, 2013 0415   BILITOT 0.39 12/15/2013 0837   GFRNONAA >90 12/30/2013 0415   GFRAA >90 2013-12-30 0415    Chest Xray Reviewed/Impressions:Prominent right upper mediastinum and right hilar region probable  due to adenopathy. Hazy airspace is right midlung suspicious for  postobstructive pneumonia or asymmetric edema. Hazy bilateral  basilar atelectasis or infiltrate.    CT scan of the chest Reviewed/Impressions:  Progression of mediastinal, right hilar, and left AP window  lymphadenopathy compatible with intrathoracic metastatic disease.  Stable right-sided pulmonary nodules.  Progression of hepatic  metastatic disease.  Innumerable sclerotic osseous metastasis reidentified.  Probable gallstone without other CT evidence for acute  cholecystitis.   MRI Brain:   1. No evidence of acute intracranial abnormality or parenchymal or  meningeal brain metastases.  2. Abnormal bone marrow signal in  the skull and visualized cervical  spine, likely reflecting underlying widespread osseous metastases.    Time In Time Out Total Time Spent with Patient Total Overall Time  1200  pm 1220 pm 20 min 20 min    Greater than 50%  of this time was spent counseling and coordinating care related to the above assessment and plan.       Rahmon Heigl L. Lovena Le, MD MBA The Palliative Medicine Team at [facility] Phone: [phone] Pager: [phone]

## 2014-01-07 NOTE — Progress Notes (Signed)
Patient ID: Jessica Rollins, female   DOB: 1956-03-09, 58 y.o.   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 In ED, pt found to be hypoxic with oxygen saturation of 92% on BiPAP. HR is 127 and RR 28, T max 100.8 F. CXR showed prominent right upper mediastinum and right hilar region adenopathy. Hazy airspace is right midlung suspicious for postobstructive pneumonia or asymmetric edema. She was started on azithromycin and rocephin in ED for possible pneumonia. She required BiPAP to keep O2 sats above 90%. Per discussion with family and PCT, PCCM focus is now on comfort care. Pt started on morphine drip.  Assessment and Plan:   Principal Problem:  Acute respiratory failure with hypoxia/ postobstructive pneumonia / non small cell lung ca / pulmonary fibrosis / COPD exacerbation   Likely due to combination of postobstructive pneumonia, pulmonary fibrosis, lung ca, acute decompensated diastolic CHF   Requires BiPAP for keeping O2 sats above 90%.  Antibiotics stopped as family now wants to focus on comfort care. Appreciate PCCM and PCT assistance  Pt placed on morphine drip. Active Problems:  Acute decompensated diastolic CHF   No need for lasix or obtaining 2 D ECHO; family wants to focus on comfort care Anemia of chronic disease   Secondary to history of malignancy   No further blood draw to ensure comfort Thrombocytopenia   Platelet count 55; using SCD;s for DVT prophylaxis  Diabetes mellitus without complication   Focus on comfort   Stopped CBG checks    Radiological Exams on Admission:  Mr Kizzie Fantasia  Contrast  12/25/2013 IMPRESSION: 1. No evidence of acute intracranial abnormality or parenchymal or meningeal brain metastases. 2. Abnormal bone marrow signal in the skull and visualized cervical spine, likely reflecting underlying widespread osseous metastases.  Dg Chest Portable 1 View  12/10/2013 IMPRESSION: Prominent right upper mediastinum and right hilar region probable due to adenopathy. Hazy airspace is right midlung suspicious for postobstructive pneumonia or asymmetric edema. Hazy bilateral basilar atelectasis or infiltrate.    DVT prophylaxis: SCD's due to thrombocytopenia and focus on comfort   Code Status: DNR/DNI  Family Communication: plan of care discussed with the patient   Leisa Lenz, MD  [facility] Pager [phone]  If 7PM-7AM, please contact night-coverage [email or web address] Password [email or web address] 2014/01/08, 10:22 AM   LOS: 1 day   Consultants:  PCT  PCCM  Procedures:  None   Antibiotics:  Started on azithromycin and rocephin which now stopped 01/08/14 to ensure comfort care  HPI/Subjective: No acute overnight events.  Objective: Filed Vitals:   January 08, 2014 0600 01-08-14 0800 01/08/14 0815 January 08, 2014 0820  BP: 136/49     Pulse: 108     Temp:  97.1 F (36.2 C)    TempSrc:  Axillary    Resp: 16     Height:      Weight:      SpO2: 100%  100% 98%    Intake/Output Summary (Last 24 hours) at 2014-01-08 1022 Last data filed at 2014/01/08 0600  Gross per 24 hour  Intake  333.5 ml  Output   1250 ml  Net -916.5 ml    Exam:   General:  Pt is  on BiPAP, agitated  Cardiovascular: Regular rate and rhythm, S1/S2 appreciated   Respiratory: coarse breath sounds, no wheezing   Abdomen: Soft, non tender, non distended, bowel sounds present  Extremities: LE +1 pitting edema, pulses DP and PT palpable bilaterally  Neuro: Grossly nonfocal  Data Reviewed: Basic Metabolic Panel:  Recent Labs Lab 12/15/13 0837 12/26/2013 1204 12-26-2013 0415  NA 139 136* 140   K 4.1 5.0 3.4*  CL  --  91* 95*  CO2 19* 19 28  GLUCOSE 183* 83 99  BUN 33.7* 53* 29*  CREATININE 0.6 0.58 0.36*  CALCIUM 9.1 7.7* 7.0*  MG 2.3  --   --    Liver Function Tests:  Recent Labs Lab 12/15/13 0837 2013-12-26 0415  AST 45* 118*  ALT 27 102*  ALKPHOS 185* 232*  BILITOT 0.39 1.9*  PROT 6.5 6.5  ALBUMIN 3.0* 2.3*   No results found for this basename: LIPASE, AMYLASE,  in the last 168 hours No results found for this basename: AMMONIA,  in the last 168 hours CBC:  Recent Labs Lab 12/15/13 0837 12/22/2013 1204 12/26/2013 0415  WBC 10.8* 5.6 3.5*  NEUTROABS 9.2* 3.7  --   HGB 8.6* 7.8* 7.3*  HCT 27.6* 24.9* 22.4*  MCV 93.9 94.7 90.7  PLT 139* 55* 32*   Cardiac Enzymes:  Recent Labs Lab 12/26/2013 1204  TROPONINI <0.30   BNP: No components found with this basename: POCBNP,  CBG:  Recent Labs Lab 12/27/2013 1747  GLUCAP 132*    MRSA PCR SCREENING     Status: Abnormal   Collection Time    12/12/2013  5:56 PM      Result Value Ref Range Status   MRSA by PCR POSITIVE (*) NEGATIVE Final     Studies: Mr Kizzie Fantasia Contrast 12/17/2013     IMPRESSION: 1. No evidence of acute intracranial abnormality or parenchymal or meningeal brain metastases. 2. Abnormal bone marrow signal in the skull and visualized cervical spine, likely reflecting underlying widespread osseous metastases.     Dg Chest Portable 1 View 12/16/2013    IMPRESSION: Prominent right upper mediastinum and right hilar region probable due to adenopathy. Hazy airspace is right midlung suspicious for postobstructive pneumonia or asymmetric edema. Hazy bilateral basilar atelectasis or infiltrate.      Scheduled Meds:  Continuous Infusions: . sodium chloride 10 mL/hr at 12/13/2013 2139  . morphine 10 mg/hr (12/26/2013 0915)

## 2014-01-07 NOTE — Progress Notes (Signed)
PULMONARY  / CRITICAL CARE MEDICINE  Name: Jessica Rollins MRN: [id number] DOB: June 10, 1956    ADMISSION DATE:  12/30/2013 CONSULTATION DATE:  12/10/2013  REFERRING MD :  Leisa Lenz, MD PRIMARY SERVICE: [facility]  CHIEF COMPLAINT:  SOB  BRIEF PATIENT DESCRIPTION:  58 yo female with pulmonary fibrosis, OSA, COPD and metastatic small cell lung cancer presenting to ED with acute on chronic hypoxemic respiratory failure requiring BiPAP and with CXR concerning for pneumonia and edema. We are consulted for evaluation and management of hypoxemic respiratory failure.  SIGNIFICANT EVENTS / STUDIES: 6/1 ct chest, abdo>>Progression of mediastinal, right hilar, and left AP window<BR>lymphadenopathy compatible with intrathoracic metastatic disease.<BR> <BR>Stable right-sided pulmonary nodules.<BR> <BR>Progression of hepatic metastatic disease.<BR> <BR>Innumerable sclerotic osseous metastasis reidentified 6/12 MRI brain>>>mets skull cervical spine 12/29/2013 - Admission to [facility], started on BiPAP 6/14- remains on NIMV, appears more comfortable  LINES / TUBES: PIV  CULTURES: 01/02/2014 Sputum culture>>> 01/05/2014 Urine culture>>>  ANTIBIOTICS: Ceftriaxone 12/11/2013 >>> 12/27/2013 Azithromycin 12/22/2013 >>> Vancomycin 01/04/2014 >>> Cefepime 01/02/2014 >>>  SUBJECTIVE: remains on NIMV  VITAL SIGNS: Temp:  [98.2 F (36.8 C)-102 F (38.9 C)] 99.6 F (37.6 C) (06/14 0400) Pulse Rate:  [76-127] 108 (06/14 0600) Resp:  [14-28] 16 (06/14 0600) BP: (88-152)/(39-70) 136/49 mmHg (06/14 0600) SpO2:  [91 %-100 %] 100 % (06/14 0600) FiO2 (%):  [100 %] 100 % (06/14 0400) Weight:  [73.8 kg (162 lb 11.2 oz)-76.3 kg (168 lb 3.4 oz)] 73.8 kg (162 lb 11.2 oz) (06/14 0400) HEMODYNAMICS:   VENTILATOR SETTINGS: Vent Mode:  [-]  FiO2 (%):  [100 %] 100 % INTAKE / OUTPUT: Intake/Output     06/13 0701 - 06/14 0700 06/14 0701 - 06/15 0700   I.V. (mL/kg) 83.5 (1.1)    IV Piggyback 250    Total  Intake(mL/kg) 333.5 (4.5)    Urine (mL/kg/hr) 1250    Total Output 1250     Net -916.5            PHYSICAL EXAMINATION: General:  More comfortable on NIMV Neuro:  Somnolent, rass -2 able to move all extremities HEENT: BiPAP in place  Cardiovascular:  Mild Tachycardic s1 s 2 Lungs:  Bilateral rhonchi unchanged Abdomen:  Soft, nontender, nondistended Musculoskeletal: edema Skin:  No rash  LABS:  CBC Recent Labs     12/14/2013  1204  2014/01/19  0415  WBC  5.6  3.5*  HGB  7.8*  7.3*  HCT  24.9*  22.4*  PLT  55*  32*   Coag's No results found for this basename: APTT, INR,  in the last 72 hours BMET Recent Labs     01/02/2014  1204  01-19-14  0415  NA  136*  140  K  5.0  3.4*  CL  91*  95*  CO2  19  28  BUN  53*  29*  CREATININE  0.58  0.36*  GLUCOSE  83  99   Electrolytes Recent Labs     12/13/2013  1204  2014-01-19  0415  CALCIUM  7.7*  7.0*   Sepsis Markers No results found for this basename: LACTICACIDVEN, PROCALCITON, O2SATVEN,  in the last 72 hours ABG Recent Labs     12/10/2013  1144  01/03/2014  2015  PHART  7.435  7.451*  PCO2ART  36.9  45.5*  PO2ART  78.8*  115.0*   Liver Enzymes Recent Labs     2014-01-19  0415  AST  118*  ALT  102*  ALKPHOS  232*  BILITOT  1.9*  ALBUMIN  2.3*   Cardiac Enzymes Recent Labs     12/17/2013  1204  12/08/2013  1206  TROPONINI  <0.30   --   PROBNP   --   10986.0*   Glucose Recent Labs     12/09/2013  1747  GLUCAP  132*    Imaging Mr Brain W Wo Contrast  12/11/2013   CLINICAL DATA:  Metastatic small-cell lung cancer. Headaches. New onset weakness.  EXAM: MRI HEAD WITHOUT AND WITH CONTRAST  TECHNIQUE: Multiplanar, multiecho pulse sequences of the brain and surrounding structures were obtained without and with intravenous contrast.  CONTRAST:  20mL MULTIHANCE GADOBENATE DIMEGLUMINE 529 MG/ML IV SOLN  COMPARISON:  09/03/2013  FINDINGS: Bone marrow signal in the skull and the visualized upper cervical spine appears  diffusely diminished compared to the prior study. There is no acute infarct. There is no evidence of intracranial hemorrhage, mass, midline shift, or extra-axial fluid collection. Several small foci of T2 hyperintensity in the cerebral white matter are unchanged and nonspecific, compatible with mild chronic small vessel ischemic disease. There is no abnormal brain parenchymal or meningeal enhancement.  Prior right cataract extraction is noted. Trace right mastoid fluid is present. Major intracranial vascular flow voids are preserved.  IMPRESSION: 1. No evidence of acute intracranial abnormality or parenchymal or meningeal brain metastases. 2. Abnormal bone marrow signal in the skull and visualized cervical spine, likely reflecting underlying widespread osseous metastases.   Electronically Signed   By: Logan Bores   On: 12/26/2013 08:22   Dg Chest Portable 1 View  12/10/2013   CLINICAL DATA:  Shortness of breath  EXAM: PORTABLE CHEST - 1 VIEW  COMPARISON:  12/08/2013  FINDINGS: There is prominence of to right upper mediastinum and right hilar region probable due to adenopathy. Cardiac size is stable. Hazy airspace disease right midlung suspicious for postobstructive pneumonia or asymmetric edema. Hazy bilateral basilar atelectasis or infiltrate.  IMPRESSION: Prominent right upper mediastinum and right hilar region probable due to adenopathy. Hazy airspace is right midlung suspicious for postobstructive pneumonia or asymmetric edema. Hazy bilateral basilar atelectasis or infiltrate.   Electronically Signed   By: Lahoma Crocker M.D.   On: 01/03/2014 12:54    ASSESSMENT / PLAN:  PULMONARY A:  Acute on chronic hypoxemic respiratory failure Pneumonia, immunocompromised host (does appear as peripheral wedge to some extent, at risk PE) COPD OSA, on CPAP at home ILD, on chronic immunosuppression with prednisone and azathioprine Metastatic small cell lung cancer, currently on chemotherapy  P:   - Agree with  solumedrol for COPD and ILD, maintain now - Albuterol/ipratroprium nebs q4 and q2 PRN -ABG reviewed, can reduce fio2 to goal 60-70%, can reduce IPAP 12, follow Tv response -need interruption min 30 min to avoid facial trauma -pcxr impressive, repeat in am  -if aggressive support continued, doppler legs, consider CT chest angio -even balance goals to neg, lasix as BP tolerates -pall care med to meet at noon to consider comfort care- this would be a reasonable decision, unlikely to survive hospital stay -have reviewed prior MRI, CT just done  CARDIOVASCULAR A: Acute diastolic CHF with volume overload  P:  - lasix to neg balance - Echocardiogram pending  INFECTIOUS A:   Pneumonia, concern for HCAP Immunosuppressed host Unlikely atypical  P:   - Continued vanc cefepime -consider dc azithro  TODAY'S SUMMARY:  Interrupt NIMV, have made changes on BIPAP, maintain current abx, prognosis poor, pall care to meet at noon  If continued aggressive care- doppler legs, ct chest  Ccm time 30 min   Lavon Paganini. Titus Mould, MD, [place] Pgr: [facility]'

## 2014-01-07 NOTE — Progress Notes (Signed)
Follow up to last night visit. Family is surrounding the bed providing active comfort and support to Jessica Rollins. Family remains committed to their collective decision to put Jessica Rollins on Palliative Care.   Jessica Rollins was anxious when visited asking for a drink of water. When asked the family was generally aware that there was methods of wetting her lips and teeth, if drinks of water could not be administered. This was relayed to nurses who took care of the complaint.  The family still has high praise for the care that is being given to Jessica Rollins and is looking forward to discussing further goals of care with staff.  Sallee Lange. Yazmin Locher, [place]

## 2014-01-07 NOTE — Discharge Summary (Addendum)
  Death Summary  Jessica Rollins ZHG:[id number] DOB: Apr 14, 1956 DOA: December 31, 2013  PCP: Ramond Dial, MD PCP/Office notified  Admit date: 31-Dec-2013 Date of Death: 01-01-2014  Final Diagnoses:  Principal Problem:   Acute respiratory failure with hypoxia Active Problems:   Pulmonary fibrosis   CAP (community acquired pneumonia)   Diastolic CHF, acute on chronic   COPD (chronic obstructive pulmonary disease)   Diabetes mellitus without complication   OSA on CPAP   Hypertension   Overactive bladder   Small cell lung carcinoma   Anxiety and depression   Anemia of chronic disease   Thrombocytopenia   History of present illness:   58 year old female with past medical history of pulmonary fibrosis, COPD, 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  In ED, pt found to be hypoxic with oxygen saturation of 92% on BiPAP. HR is 127 and RR 28, T max 100.8 F. CXR showed prominent right upper mediastinum and right hilar region adenopathy. Hazy airspace is right midlung suspicious for postobstructive pneumonia or asymmetric edema. She was started on azithromycin and rocephin in ED for possible pneumonia. She required BiPAP to keep O2 sats above 90%. Per discussion with family and PCT, PCCM focus is now on comfort care. Pt started on morphine drip.   Assessment and Plan:   Principal Problem:  Acute respiratory failure with hypoxia/ postobstructive pneumonia / non small cell lung ca / pulmonary fibrosis / COPD exacerbation  Likely due to combination of postobstructive pneumonia, pulmonary fibrosis, lung ca, acute decompensated diastolic CHF  Requires BiPAP for keeping O2 sats above 90%.  Antibiotics stopped as family now wants to focus on comfort care. Appreciate PCCM and PCT assistance  Pt placed on morphine drip. Active Problems:  Acute  decompensated diastolic CHF  No need for lasix or obtaining 2 D ECHO; family wants to focus on comfort care Anemia of chronic disease / Acute on chronic blood loss anemia   Secondary to history of malignancy  No further blood draw to ensure comfort Thrombocytopenia  Platelet count 55; using SCD;s for DVT prophylaxis  Diabetes mellitus without complication  Focus on comfort  Stopped CBG checks  Sinus tachycardia  Likely related to complex medical issues, anemia  No acute intervention required   Radiological Exams on Admission:  Mr Kizzie Fantasia Contrast  2013-12-31 IMPRESSION: 1. No evidence of acute intracranial abnormality or parenchymal or meningeal brain metastases. 2. Abnormal bone marrow signal in the skull and visualized cervical spine, likely reflecting underlying widespread osseous metastases.  Dg Chest Portable 1 View  12-31-13 IMPRESSION: Prominent right upper mediastinum and right hilar region probable due to adenopathy. Hazy airspace is right midlung suspicious for postobstructive pneumonia or asymmetric edema. Hazy bilateral basilar atelectasis or infiltrate.  DVT prophylaxis: SCD's due to thrombocytopenia and focus on comfort   Code Status: DNR/DNI    Consultants:  PCT  PCCM Procedures:  None  Antibiotics:  Started on azithromycin and rocephin which now stopped 01/01/14 to ensure comfort care     Time: 15:27 pm 01/01/14  Signed:  Leisa Lenz  [facility] 01/01/2014, 4:41 PM

## 2014-01-07 NOTE — Progress Notes (Signed)
Asystole on monitor. No breath or heart sounds auscultated .  Verified by two registered nurses Barnetta Chapel, RN and Burnis Kingfisher). Family present at bedside . Dr. Charlies Silvers notified. Time of expiration 1527.

## 2014-01-07 DEATH — deceased

## 2014-01-08 ENCOUNTER — Ambulatory Visit: Payer: Medicare Other

## 2014-01-12 ENCOUNTER — Ambulatory Visit: Payer: Medicare Other

## 2014-01-12 ENCOUNTER — Other Ambulatory Visit: Payer: Medicare Other

## 2014-01-19 ENCOUNTER — Other Ambulatory Visit: Payer: Medicare Other

## 2014-01-26 ENCOUNTER — Ambulatory Visit: Payer: Medicare Other

## 2014-01-26 ENCOUNTER — Other Ambulatory Visit: Payer: Medicare Other

## 2014-02-02 ENCOUNTER — Ambulatory Visit: Payer: Medicare Other

## 2014-02-02 ENCOUNTER — Other Ambulatory Visit: Payer: Medicare Other

## 2014-03-04 ENCOUNTER — Other Ambulatory Visit: Payer: Self-pay | Admitting: *Deleted

## 2015-06-12 IMAGING — CT NM PET TUM IMG INITIAL (PI) SKULL BASE T - THIGH
7 series · 25 of 25 positions shown · non-contrast
Comparison: CT CHEST W/CM dated 08/07/2013

CLINICAL DATA: Initial treatment strategy for staging of small cell
lung cancer..

EXAM:
NUCLEAR MEDICINE PET SKULL BASE TO THIGH
FASTING BLOOD GLUCOSE:  Value: 145 mg/dl
TECHNIQUE: 9.1 mCi F-18 FDG was injected intravenously. Full-ring PET imaging
was performed from the skull base to thigh after the radiotracer. CT
data was obtained and used for attenuation correction and anatomic
localization.

[Series 3: pet sk_thigh ac · axial · 5.0mm · 4.07mm/px · z∈[-1078,-214]mm · 5 of 217 slices shown]
[im 1/217]
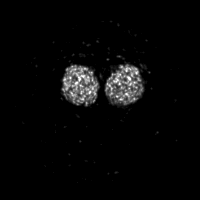
[im 55/217]
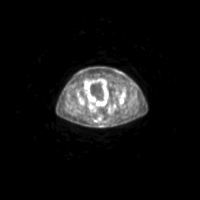
[im 109/217]
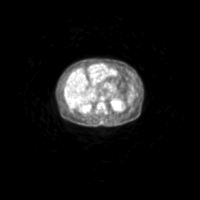
[im 163/217]
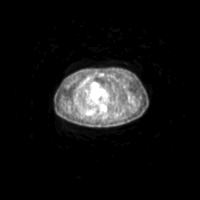
[im 217/217]
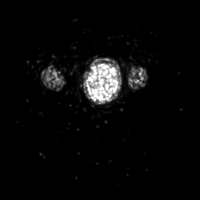

[Series 4: ct sk_thigh 5.0 b31f · axial · 5.0mm · 0.85mm/px · z∈[-1078,-214]mm · 5 of 217 slices shown]
[im 1/217]
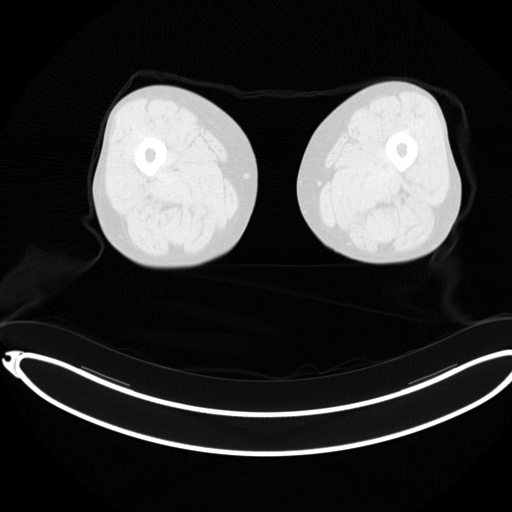
[im 55/217]
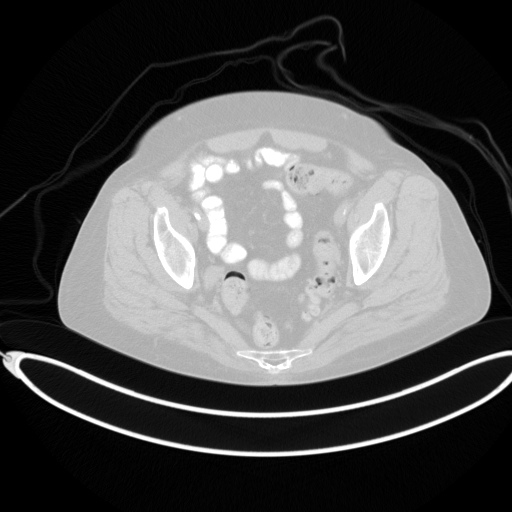
[im 109/217]
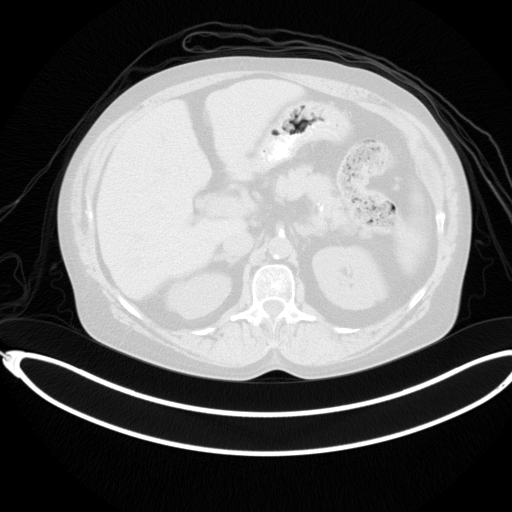
[im 163/217]
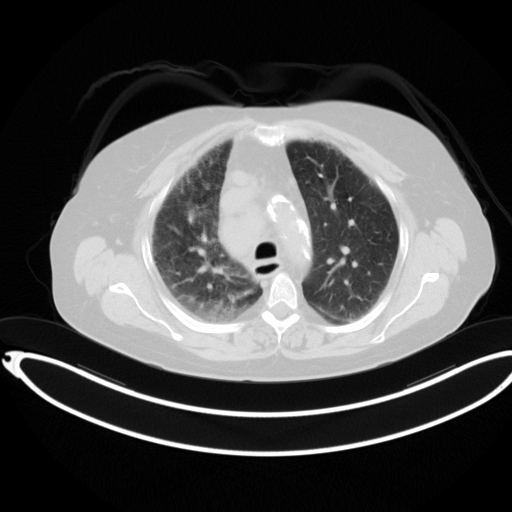
[im 217/217  brain]
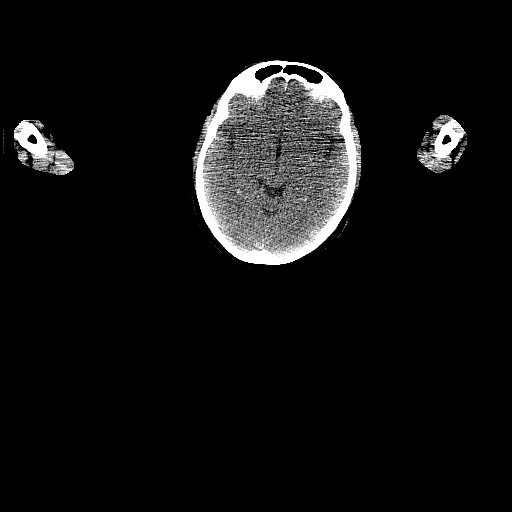

[Series 7: pet sk_thigh nac · axial · 5.0mm · 4.07mm/px · z∈[-1078,-214]mm · 6 of 217 slices shown]
[im 1/217]
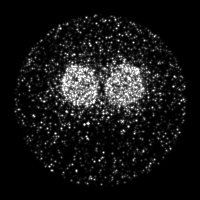
[im 44/217]
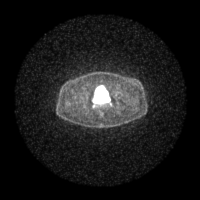
[im 87/217]
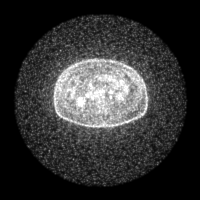
[im 130/217]
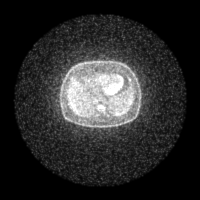
[im 173/217]
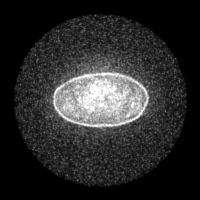
[im 217/217]
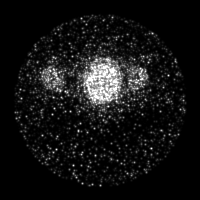

[Series 603: mip collection<mip range> · coronal · 1.79mm/px · 1 of 32 slices shown]
[im 1/32]
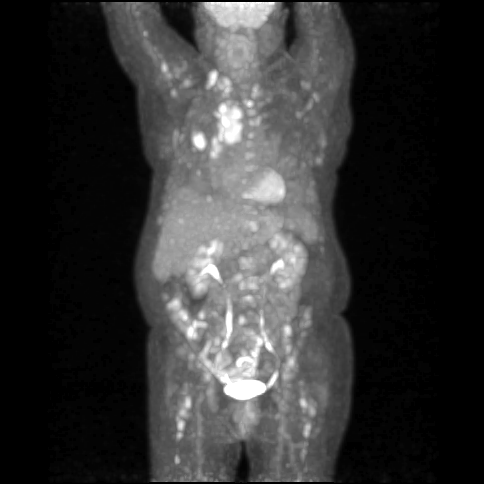

[Series 604: range-ct sk_thigh 5.0 (id)<alpha range> · 2 of 67 slices shown (1 of 2)]
[im 1/67]
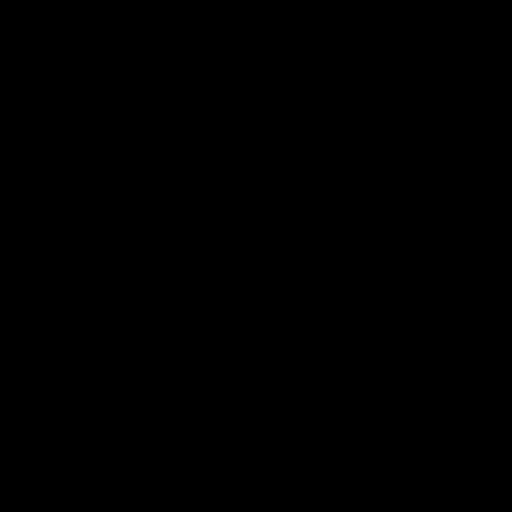
[im 67/67]
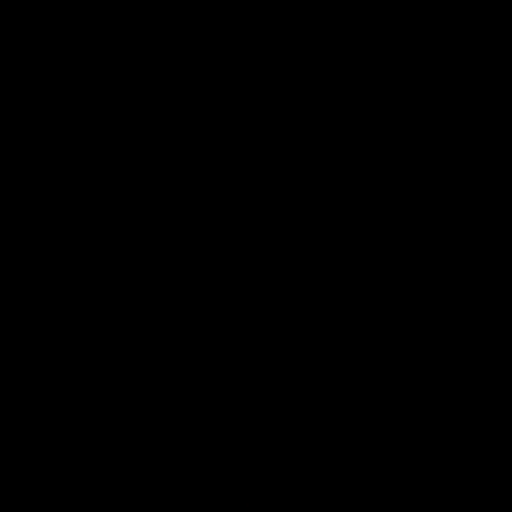

[Series 605: range-ct sk_thigh 5.0 (id)<alpha range> · 5 of 203 slices shown (2 of 2)]
[im 1/203]
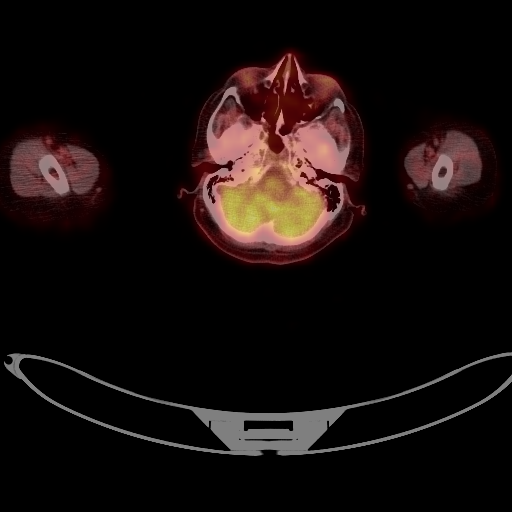
[im 51/203]
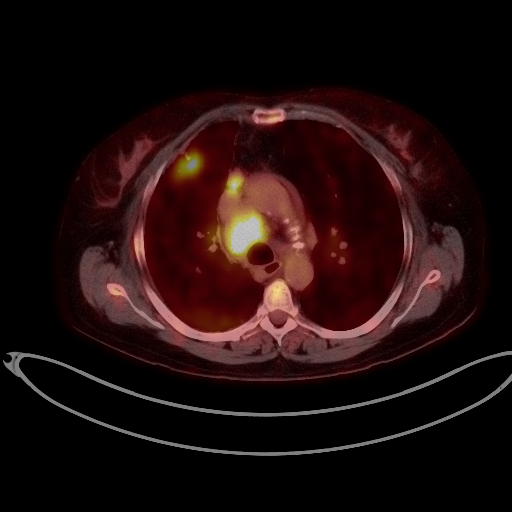
[im 102/203]
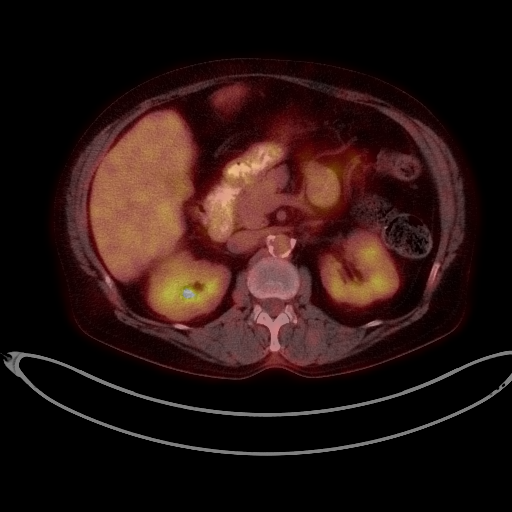
[im 152/203]
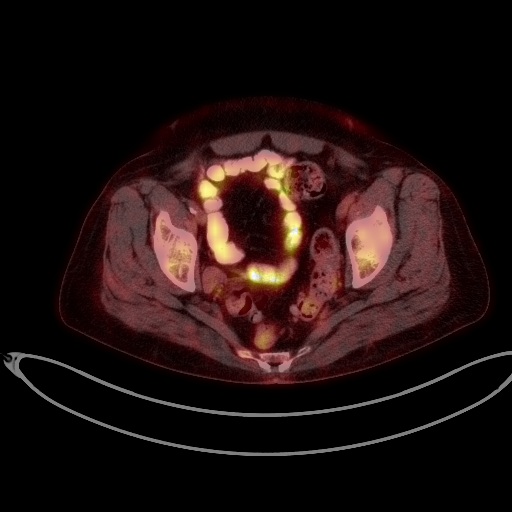
[im 203/203]
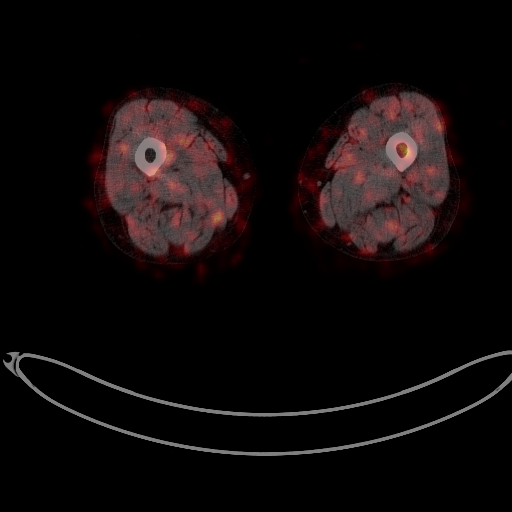

[Series 1047: results mm oncology reading · 1.05mm/px · 1 of 7 slices shown]
[im 1/7]
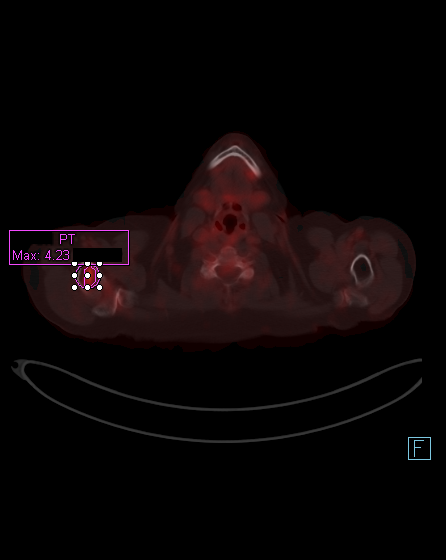

[25 of 25 positions shown; findings below may reference images not displayed]

FINDINGS: NECK

Low right cervical/ supraclavicular hypermetabolic adenopathy. An
index right supraclavicular node measures 1.6 cm and a S.U.V. max of
6.3 on image 36.

CHEST

A left thoracic inlet node measures 1.0 cm and a S.U.V. max of
on image 40. Right paratracheal nodal mass of 3.1 x 4.0 cm. This
measures a S.U.V. max of the 8.8.

Anterior right upper lobe lung nodule measures 2.8 cm and a S.U.V.
max of 9.5 on image 66. A right lower lobe nodule measures 1.2 cm
and a S.U.V. max of 2.6 on image 78.

Hypermetabolic prevascular and right hilar adenopathy as well.

ABDOMEN/PELVIS

No areas of abnormal hypermetabolism.

SKELETON

Multi focal osseous metastasis. Index right humeral head lesion
which measures a S.U.V. max of 4.2 on image 32. A likely subtly
sclerotic right femoral neck lesion measures a S.U.V. max of 4.7 on
image 181.

CT IMAGES PERFORMED FOR ATTENUATION CORRECTION

Aortic and coronary artery atherosclerosis.  Pulmonary fibrosis.

Mild cirrhosis. A small hiatal hernia. Normal adrenal glands.
Hysterectomy.
IMPRESSION: 1. Nodal metastasis within the chest and lower neck. Right upper and
right lower lobe lung lesions which could represent primary or
primaries.
2. Osseous metastasis.
3. Cirrhosis.
4.  Age advanced coronary artery atherosclerosis.
